# Patient Record
Sex: Female | Born: 1940 | Race: White | Hispanic: No | Smoking: Never smoker
Health system: Southern US, Community
[De-identification: ages and names within clinical notes are randomized; demographics above are authoritative.]

## PROBLEM LIST (undated history)

## (undated) DIAGNOSIS — D259 Leiomyoma of uterus, unspecified: Secondary | ICD-10-CM

## (undated) DIAGNOSIS — E039 Hypothyroidism, unspecified: Secondary | ICD-10-CM

## (undated) DIAGNOSIS — N1330 Unspecified hydronephrosis: Secondary | ICD-10-CM

## (undated) DIAGNOSIS — C50919 Malignant neoplasm of unspecified site of unspecified female breast: Secondary | ICD-10-CM

## (undated) DIAGNOSIS — C801 Malignant (primary) neoplasm, unspecified: Secondary | ICD-10-CM

## (undated) DIAGNOSIS — E785 Hyperlipidemia, unspecified: Secondary | ICD-10-CM

## (undated) HISTORY — DX: Leiomyoma of uterus, unspecified: D25.9

## (undated) HISTORY — PX: MASTECTOMY: SHX3

## (undated) HISTORY — DX: Hyperlipidemia, unspecified: E78.5

## (undated) HISTORY — DX: Malignant neoplasm of unspecified site of unspecified female breast: C50.919

---

## 2009-08-29 ENCOUNTER — Encounter: Admission: RE | Admit: 2009-08-29 | Discharge: 2009-08-29 | Payer: Self-pay | Admitting: Family Medicine

## 2010-11-25 ENCOUNTER — Encounter: Payer: Self-pay | Admitting: Family Medicine

## 2011-04-17 ENCOUNTER — Encounter (HOSPITAL_BASED_OUTPATIENT_CLINIC_OR_DEPARTMENT_OTHER): Payer: Medicare Other | Admitting: Oncology

## 2011-04-17 DIAGNOSIS — C50919 Malignant neoplasm of unspecified site of unspecified female breast: Secondary | ICD-10-CM

## 2011-04-17 DIAGNOSIS — Z800 Family history of malignant neoplasm of digestive organs: Secondary | ICD-10-CM

## 2011-04-17 DIAGNOSIS — N1330 Unspecified hydronephrosis: Secondary | ICD-10-CM

## 2011-04-18 ENCOUNTER — Other Ambulatory Visit: Payer: Self-pay | Admitting: Oncology

## 2011-04-18 DIAGNOSIS — C50919 Malignant neoplasm of unspecified site of unspecified female breast: Secondary | ICD-10-CM

## 2011-04-19 ENCOUNTER — Other Ambulatory Visit: Payer: Self-pay | Admitting: Oncology

## 2011-04-19 DIAGNOSIS — Z853 Personal history of malignant neoplasm of breast: Secondary | ICD-10-CM

## 2011-04-22 ENCOUNTER — Encounter: Payer: Self-pay | Admitting: Oncology

## 2011-04-23 ENCOUNTER — Ambulatory Visit (HOSPITAL_COMMUNITY)
Admission: RE | Admit: 2011-04-23 | Discharge: 2011-04-23 | Disposition: A | Discharge status: Home / Self Care | Payer: Medicare Other | Source: Ambulatory Visit | Attending: Oncology | Admitting: Oncology

## 2011-04-23 ENCOUNTER — Other Ambulatory Visit: Payer: Self-pay | Admitting: Oncology

## 2011-04-23 ENCOUNTER — Ambulatory Visit
Admission: RE | Admit: 2011-04-23 | Discharge: 2011-04-23 | Disposition: A | Discharge status: Home / Self Care | Payer: Medicare Other | Source: Ambulatory Visit | Attending: Oncology | Admitting: Oncology

## 2011-04-23 DIAGNOSIS — Z853 Personal history of malignant neoplasm of breast: Secondary | ICD-10-CM

## 2011-04-23 DIAGNOSIS — Z01818 Encounter for other preprocedural examination: Secondary | ICD-10-CM | POA: Insufficient documentation

## 2011-04-23 DIAGNOSIS — C50919 Malignant neoplasm of unspecified site of unspecified female breast: Secondary | ICD-10-CM | POA: Insufficient documentation

## 2011-04-25 ENCOUNTER — Other Ambulatory Visit (HOSPITAL_COMMUNITY): Payer: Self-pay | Admitting: Urology

## 2011-04-25 DIAGNOSIS — C50919 Malignant neoplasm of unspecified site of unspecified female breast: Secondary | ICD-10-CM

## 2011-04-25 DIAGNOSIS — N1330 Unspecified hydronephrosis: Secondary | ICD-10-CM

## 2011-04-30 ENCOUNTER — Encounter: Payer: Medicare Other | Admitting: Oncology

## 2011-04-30 ENCOUNTER — Other Ambulatory Visit: Payer: Self-pay | Admitting: Oncology

## 2011-04-30 LAB — COMPREHENSIVE METABOLIC PANEL
AST: 20 U/L (ref 0–37)
Alkaline Phosphatase: 49 U/L (ref 39–117)
BUN: 14 mg/dL (ref 6–23)
Creatinine, Ser: 0.79 mg/dL (ref 0.50–1.10)
Potassium: 4.4 mEq/L (ref 3.5–5.3)
Sodium: 139 mEq/L (ref 135–145)
Total Protein: 6.7 g/dL (ref 6.0–8.3)

## 2011-04-30 LAB — CEA: CEA: 1 ng/mL (ref 0.0–5.0)

## 2011-05-01 ENCOUNTER — Encounter (HOSPITAL_COMMUNITY): Payer: Self-pay

## 2011-05-01 ENCOUNTER — Ambulatory Visit (HOSPITAL_COMMUNITY)
Admission: RE | Admit: 2011-05-01 | Discharge: 2011-05-01 | Disposition: A | Discharge status: Home / Self Care | Payer: Medicare Other | Source: Ambulatory Visit | Attending: Oncology | Admitting: Oncology

## 2011-05-01 ENCOUNTER — Encounter (HOSPITAL_COMMUNITY)
Admission: RE | Admit: 2011-05-01 | Discharge: 2011-05-01 | Disposition: A | Discharge status: Home / Self Care | Payer: Medicare Other | Source: Ambulatory Visit | Attending: Urology | Admitting: Urology

## 2011-05-01 ENCOUNTER — Other Ambulatory Visit: Payer: Self-pay | Admitting: Oncology

## 2011-05-01 DIAGNOSIS — C50919 Malignant neoplasm of unspecified site of unspecified female breast: Secondary | ICD-10-CM | POA: Insufficient documentation

## 2011-05-01 DIAGNOSIS — D259 Leiomyoma of uterus, unspecified: Secondary | ICD-10-CM | POA: Insufficient documentation

## 2011-05-01 DIAGNOSIS — Z9010 Acquired absence of unspecified breast and nipple: Secondary | ICD-10-CM | POA: Insufficient documentation

## 2011-05-01 DIAGNOSIS — N1330 Unspecified hydronephrosis: Secondary | ICD-10-CM

## 2011-05-01 HISTORY — DX: Unspecified hydronephrosis: N13.30

## 2011-05-01 HISTORY — DX: Malignant (primary) neoplasm, unspecified: C80.1

## 2011-05-01 MED ORDER — IOHEXOL 300 MG/ML  SOLN
100.0000 mL | Freq: Once | INTRAMUSCULAR | Status: AC | PRN
  Administered 2011-05-01: 100 mL via INTRAVENOUS

## 2011-05-01 MED ORDER — FUROSEMIDE 10 MG/ML IJ SOLN: 40.0000 mg | Freq: Once | INTRAMUSCULAR | Status: DC

## 2011-05-01 MED ORDER — FLUDEOXYGLUCOSE F - 18 (FDG) INJECTION
17.2000 | Freq: Once | INTRAVENOUS | Status: AC | PRN
  Administered 2011-05-01: 17.2 via INTRAVENOUS

## 2011-05-01 MED ORDER — TECHNETIUM TC 99M MERTIATIDE
16.0000 | Freq: Once | INTRAVENOUS | Status: AC | PRN
  Administered 2011-05-01: 16 via INTRAVENOUS

## 2011-05-02 ENCOUNTER — Ambulatory Visit (HOSPITAL_COMMUNITY)
Admission: RE | Admit: 2011-05-02 | Discharge: 2011-05-02 | Disposition: A | Discharge status: Home / Self Care | Payer: Medicare Other | Source: Ambulatory Visit | Attending: Oncology | Admitting: Oncology

## 2011-05-02 ENCOUNTER — Other Ambulatory Visit: Payer: Self-pay | Admitting: Oncology

## 2011-05-02 ENCOUNTER — Ambulatory Visit (HOSPITAL_COMMUNITY): Admission: RE | Admit: 2011-05-02 | Payer: Medicare Other | Source: Ambulatory Visit | Admitting: Oncology

## 2011-05-02 DIAGNOSIS — C50919 Malignant neoplasm of unspecified site of unspecified female breast: Secondary | ICD-10-CM

## 2011-05-02 DIAGNOSIS — Z9010 Acquired absence of unspecified breast and nipple: Secondary | ICD-10-CM | POA: Insufficient documentation

## 2011-05-02 DIAGNOSIS — Z01812 Encounter for preprocedural laboratory examination: Secondary | ICD-10-CM | POA: Insufficient documentation

## 2011-05-02 LAB — CBC
HCT: 39.4 % (ref 36.0–46.0)
Hemoglobin: 13.2 g/dL (ref 12.0–15.0)
MCHC: 33.5 g/dL (ref 30.0–36.0)
MCV: 86 fL (ref 78.0–100.0)
RDW: 13.2 % (ref 11.5–15.5)

## 2011-05-03 ENCOUNTER — Encounter: Payer: Medicare Other | Admitting: Oncology

## 2011-05-10 ENCOUNTER — Encounter (HOSPITAL_BASED_OUTPATIENT_CLINIC_OR_DEPARTMENT_OTHER): Payer: Medicare Other | Admitting: Oncology

## 2011-05-10 ENCOUNTER — Other Ambulatory Visit: Payer: Self-pay | Admitting: Oncology

## 2011-05-10 DIAGNOSIS — C773 Secondary and unspecified malignant neoplasm of axilla and upper limb lymph nodes: Secondary | ICD-10-CM

## 2011-05-10 DIAGNOSIS — Z5111 Encounter for antineoplastic chemotherapy: Secondary | ICD-10-CM

## 2011-05-10 DIAGNOSIS — C50919 Malignant neoplasm of unspecified site of unspecified female breast: Secondary | ICD-10-CM

## 2011-05-10 LAB — CBC WITH DIFFERENTIAL/PLATELET
BASO%: 1.2 % (ref 0.0–2.0)
Basophils Absolute: 0.1 10*3/uL (ref 0.0–0.1)
EOS%: 4 % (ref 0.0–7.0)
HGB: 13.1 g/dL (ref 11.6–15.9)
MCH: 29.8 pg (ref 25.1–34.0)
MCV: 86.6 fL (ref 79.5–101.0)
MONO%: 7.9 % (ref 0.0–14.0)
RBC: 4.38 10*6/uL (ref 3.70–5.45)
RDW: 13.2 % (ref 11.2–14.5)
lymph#: 1.6 10*3/uL (ref 0.9–3.3)

## 2011-05-11 ENCOUNTER — Encounter (HOSPITAL_BASED_OUTPATIENT_CLINIC_OR_DEPARTMENT_OTHER): Payer: Medicare Other | Admitting: Oncology

## 2011-05-11 DIAGNOSIS — Z5111 Encounter for antineoplastic chemotherapy: Secondary | ICD-10-CM

## 2011-05-11 DIAGNOSIS — C773 Secondary and unspecified malignant neoplasm of axilla and upper limb lymph nodes: Secondary | ICD-10-CM

## 2011-05-11 DIAGNOSIS — C50919 Malignant neoplasm of unspecified site of unspecified female breast: Secondary | ICD-10-CM

## 2011-05-17 ENCOUNTER — Other Ambulatory Visit: Payer: Self-pay | Admitting: Oncology

## 2011-05-17 ENCOUNTER — Encounter (HOSPITAL_BASED_OUTPATIENT_CLINIC_OR_DEPARTMENT_OTHER): Payer: Medicare Other | Admitting: Oncology

## 2011-05-17 DIAGNOSIS — C773 Secondary and unspecified malignant neoplasm of axilla and upper limb lymph nodes: Secondary | ICD-10-CM

## 2011-05-17 DIAGNOSIS — C50919 Malignant neoplasm of unspecified site of unspecified female breast: Secondary | ICD-10-CM

## 2011-05-17 DIAGNOSIS — Z170 Estrogen receptor positive status [ER+]: Secondary | ICD-10-CM

## 2011-05-17 DIAGNOSIS — D709 Neutropenia, unspecified: Secondary | ICD-10-CM

## 2011-05-17 LAB — CBC WITH DIFFERENTIAL/PLATELET
Basophils Absolute: 0 10*3/uL (ref 0.0–0.1)
Eosinophils Absolute: 0.2 10*3/uL (ref 0.0–0.5)
HGB: 11.8 g/dL (ref 11.6–15.9)
MONO#: 0 10*3/uL — ABNORMAL LOW (ref 0.1–0.9)
MONO%: 2.2 % (ref 0.0–14.0)
NEUT#: 0.2 10*3/uL — CL (ref 1.5–6.5)
RBC: 3.97 10*6/uL (ref 3.70–5.45)
RDW: 13.1 % (ref 11.2–14.5)
WBC: 1.4 10*3/uL — ABNORMAL LOW (ref 3.9–10.3)
lymph#: 1 10*3/uL (ref 0.9–3.3)

## 2011-05-17 LAB — BASIC METABOLIC PANEL
BUN: 17 mg/dL (ref 6–23)
Chloride: 102 mEq/L (ref 96–112)
Glucose, Bld: 113 mg/dL — ABNORMAL HIGH (ref 70–99)
Potassium: 4.7 mEq/L (ref 3.5–5.3)
Sodium: 139 mEq/L (ref 135–145)

## 2011-05-31 ENCOUNTER — Other Ambulatory Visit: Payer: Self-pay | Admitting: Oncology

## 2011-05-31 ENCOUNTER — Encounter (HOSPITAL_BASED_OUTPATIENT_CLINIC_OR_DEPARTMENT_OTHER): Payer: Medicare Other | Admitting: Oncology

## 2011-05-31 DIAGNOSIS — C50919 Malignant neoplasm of unspecified site of unspecified female breast: Secondary | ICD-10-CM

## 2011-05-31 LAB — CBC WITH DIFFERENTIAL/PLATELET
BASO%: 1.1 % (ref 0.0–2.0)
Eosinophils Absolute: 0 10*3/uL (ref 0.0–0.5)
HCT: 36.3 % (ref 34.8–46.6)
LYMPH%: 25 % (ref 14.0–49.7)
MCHC: 33.9 g/dL (ref 31.5–36.0)
MCV: 84.8 fL (ref 79.5–101.0)
MONO#: 1 10*3/uL — ABNORMAL HIGH (ref 0.1–0.9)
MONO%: 11.3 % (ref 0.0–14.0)
NEUT%: 62.3 % (ref 38.4–76.8)
Platelets: 282 10*3/uL (ref 145–400)
WBC: 9 10*3/uL (ref 3.9–10.3)

## 2011-05-31 LAB — COMPREHENSIVE METABOLIC PANEL
BUN: 15 mg/dL (ref 6–23)
CO2: 22 mEq/L (ref 19–32)
Calcium: 10.3 mg/dL (ref 8.4–10.5)
Chloride: 105 mEq/L (ref 96–112)
Creatinine, Ser: 0.8 mg/dL (ref 0.50–1.10)
Total Bilirubin: 0.3 mg/dL (ref 0.3–1.2)

## 2011-06-01 ENCOUNTER — Encounter (HOSPITAL_BASED_OUTPATIENT_CLINIC_OR_DEPARTMENT_OTHER): Payer: Medicare Other | Admitting: Oncology

## 2011-06-01 DIAGNOSIS — D709 Neutropenia, unspecified: Secondary | ICD-10-CM

## 2011-06-01 DIAGNOSIS — C50919 Malignant neoplasm of unspecified site of unspecified female breast: Secondary | ICD-10-CM

## 2011-06-01 DIAGNOSIS — C773 Secondary and unspecified malignant neoplasm of axilla and upper limb lymph nodes: Secondary | ICD-10-CM

## 2011-06-07 ENCOUNTER — Encounter (HOSPITAL_BASED_OUTPATIENT_CLINIC_OR_DEPARTMENT_OTHER): Payer: Medicare Other | Admitting: Oncology

## 2011-06-07 ENCOUNTER — Other Ambulatory Visit: Payer: Self-pay | Admitting: Physician Assistant

## 2011-06-07 DIAGNOSIS — C773 Secondary and unspecified malignant neoplasm of axilla and upper limb lymph nodes: Secondary | ICD-10-CM

## 2011-06-07 DIAGNOSIS — Z170 Estrogen receptor positive status [ER+]: Secondary | ICD-10-CM

## 2011-06-07 DIAGNOSIS — C50919 Malignant neoplasm of unspecified site of unspecified female breast: Secondary | ICD-10-CM

## 2011-06-07 LAB — CBC WITH DIFFERENTIAL/PLATELET
Basophils Absolute: 0 10*3/uL (ref 0.0–0.1)
Eosinophils Absolute: 0.1 10*3/uL (ref 0.0–0.5)
HCT: 31.5 % — ABNORMAL LOW (ref 34.8–46.6)
HGB: 10.9 g/dL — ABNORMAL LOW (ref 11.6–15.9)
MONO#: 0.1 10*3/uL (ref 0.1–0.9)
NEUT%: 61.3 % (ref 38.4–76.8)
WBC: 2.9 10*3/uL — ABNORMAL LOW (ref 3.9–10.3)
lymph#: 1 10*3/uL (ref 0.9–3.3)

## 2011-06-21 ENCOUNTER — Other Ambulatory Visit: Payer: Self-pay | Admitting: Physician Assistant

## 2011-06-21 ENCOUNTER — Encounter (HOSPITAL_BASED_OUTPATIENT_CLINIC_OR_DEPARTMENT_OTHER): Payer: Medicare Other | Admitting: Oncology

## 2011-06-21 ENCOUNTER — Other Ambulatory Visit: Payer: Self-pay | Admitting: Oncology

## 2011-06-21 DIAGNOSIS — C50919 Malignant neoplasm of unspecified site of unspecified female breast: Secondary | ICD-10-CM

## 2011-06-21 DIAGNOSIS — Z5111 Encounter for antineoplastic chemotherapy: Secondary | ICD-10-CM

## 2011-06-21 DIAGNOSIS — C773 Secondary and unspecified malignant neoplasm of axilla and upper limb lymph nodes: Secondary | ICD-10-CM

## 2011-06-21 DIAGNOSIS — Z170 Estrogen receptor positive status [ER+]: Secondary | ICD-10-CM

## 2011-06-21 LAB — COMPREHENSIVE METABOLIC PANEL
ALT: 13 U/L (ref 0–35)
AST: 25 U/L (ref 0–37)
Albumin: 4.3 g/dL (ref 3.5–5.2)
Alkaline Phosphatase: 82 U/L (ref 39–117)
BUN: 15 mg/dL (ref 6–23)
CO2: 26 mEq/L (ref 19–32)
Calcium: 9.7 mg/dL (ref 8.4–10.5)
Glucose, Bld: 88 mg/dL (ref 70–99)

## 2011-06-21 LAB — CBC WITH DIFFERENTIAL/PLATELET
Basophils Absolute: 0 10*3/uL (ref 0.0–0.1)
Eosinophils Absolute: 0.1 10*3/uL (ref 0.0–0.5)
HCT: 33.2 % — ABNORMAL LOW (ref 34.8–46.6)
LYMPH%: 17.6 % (ref 14.0–49.7)
MCV: 86.6 fL (ref 79.5–101.0)
MONO#: 0.9 10*3/uL (ref 0.1–0.9)
MONO%: 13.2 % (ref 0.0–14.0)
NEUT#: 4.4 10*3/uL (ref 1.5–6.5)
NEUT%: 67 % (ref 38.4–76.8)
Platelets: 238 10*3/uL (ref 145–400)
WBC: 6.6 10*3/uL (ref 3.9–10.3)

## 2011-06-22 ENCOUNTER — Encounter (HOSPITAL_BASED_OUTPATIENT_CLINIC_OR_DEPARTMENT_OTHER): Payer: Medicare Other | Admitting: Oncology

## 2011-06-22 DIAGNOSIS — C773 Secondary and unspecified malignant neoplasm of axilla and upper limb lymph nodes: Secondary | ICD-10-CM

## 2011-06-22 DIAGNOSIS — C50919 Malignant neoplasm of unspecified site of unspecified female breast: Secondary | ICD-10-CM

## 2011-06-28 ENCOUNTER — Encounter (HOSPITAL_BASED_OUTPATIENT_CLINIC_OR_DEPARTMENT_OTHER): Payer: Medicare Other | Admitting: Oncology

## 2011-06-28 ENCOUNTER — Other Ambulatory Visit: Payer: Self-pay | Admitting: Physician Assistant

## 2011-06-28 DIAGNOSIS — C773 Secondary and unspecified malignant neoplasm of axilla and upper limb lymph nodes: Secondary | ICD-10-CM

## 2011-06-28 DIAGNOSIS — C50919 Malignant neoplasm of unspecified site of unspecified female breast: Secondary | ICD-10-CM

## 2011-06-28 LAB — CBC WITH DIFFERENTIAL/PLATELET
BASO%: 1.5 % (ref 0.0–2.0)
EOS%: 1.5 % (ref 0.0–7.0)
MCH: 28.9 pg (ref 25.1–34.0)
MCHC: 33.4 g/dL (ref 31.5–36.0)
MCV: 86.3 fL (ref 79.5–101.0)
MONO%: 1.5 % (ref 0.0–14.0)
NEUT%: 57.9 % (ref 38.4–76.8)
RDW: 15.9 % — ABNORMAL HIGH (ref 11.2–14.5)
lymph#: 1 10*3/uL (ref 0.9–3.3)

## 2011-07-12 ENCOUNTER — Other Ambulatory Visit: Payer: Self-pay | Admitting: Physician Assistant

## 2011-07-12 ENCOUNTER — Encounter (HOSPITAL_BASED_OUTPATIENT_CLINIC_OR_DEPARTMENT_OTHER): Payer: Medicare Other | Admitting: Oncology

## 2011-07-12 DIAGNOSIS — C773 Secondary and unspecified malignant neoplasm of axilla and upper limb lymph nodes: Secondary | ICD-10-CM

## 2011-07-12 DIAGNOSIS — Z5111 Encounter for antineoplastic chemotherapy: Secondary | ICD-10-CM

## 2011-07-12 DIAGNOSIS — Z170 Estrogen receptor positive status [ER+]: Secondary | ICD-10-CM

## 2011-07-12 DIAGNOSIS — C50919 Malignant neoplasm of unspecified site of unspecified female breast: Secondary | ICD-10-CM

## 2011-07-12 LAB — COMPREHENSIVE METABOLIC PANEL
ALT: 31 U/L (ref 0–35)
AST: 30 U/L (ref 0–37)
Albumin: 4.1 g/dL (ref 3.5–5.2)
Alkaline Phosphatase: 79 U/L (ref 39–117)
Glucose, Bld: 87 mg/dL (ref 70–99)
Potassium: 4.5 mEq/L (ref 3.5–5.3)
Sodium: 139 mEq/L (ref 135–145)
Total Bilirubin: 0.3 mg/dL (ref 0.3–1.2)
Total Protein: 6.6 g/dL (ref 6.0–8.3)

## 2011-07-12 LAB — CBC WITH DIFFERENTIAL/PLATELET
BASO%: 1.2 % (ref 0.0–2.0)
Eosinophils Absolute: 0.1 10*3/uL (ref 0.0–0.5)
LYMPH%: 22.2 % (ref 14.0–49.7)
MCHC: 34.9 g/dL (ref 31.5–36.0)
MCV: 88.9 fL (ref 79.5–101.0)
MONO%: 12.2 % (ref 0.0–14.0)
NEUT#: 3.5 10*3/uL (ref 1.5–6.5)
Platelets: 310 10*3/uL (ref 145–400)
RBC: 3.57 10*6/uL — ABNORMAL LOW (ref 3.70–5.45)
RDW: 18.9 % — ABNORMAL HIGH (ref 11.2–14.5)
WBC: 5.5 10*3/uL (ref 3.9–10.3)

## 2011-07-13 ENCOUNTER — Encounter (HOSPITAL_BASED_OUTPATIENT_CLINIC_OR_DEPARTMENT_OTHER): Payer: Medicare Other | Admitting: Oncology

## 2011-07-13 DIAGNOSIS — C50919 Malignant neoplasm of unspecified site of unspecified female breast: Secondary | ICD-10-CM

## 2011-07-13 DIAGNOSIS — Z170 Estrogen receptor positive status [ER+]: Secondary | ICD-10-CM

## 2011-07-13 DIAGNOSIS — C773 Secondary and unspecified malignant neoplasm of axilla and upper limb lymph nodes: Secondary | ICD-10-CM

## 2011-07-19 ENCOUNTER — Other Ambulatory Visit: Payer: Self-pay | Admitting: Physician Assistant

## 2011-07-19 ENCOUNTER — Encounter (HOSPITAL_BASED_OUTPATIENT_CLINIC_OR_DEPARTMENT_OTHER): Payer: Medicare Other | Admitting: Oncology

## 2011-07-19 DIAGNOSIS — C50919 Malignant neoplasm of unspecified site of unspecified female breast: Secondary | ICD-10-CM

## 2011-07-19 LAB — CBC WITH DIFFERENTIAL/PLATELET
EOS%: 1.6 % (ref 0.0–7.0)
Eosinophils Absolute: 0 10*3/uL (ref 0.0–0.5)
LYMPH%: 38.7 % (ref 14.0–49.7)
MCH: 30 pg (ref 25.1–34.0)
MCHC: 33.7 g/dL (ref 31.5–36.0)
MCV: 89 fL (ref 79.5–101.0)
MONO%: 1.6 % (ref 0.0–14.0)
NEUT#: 1.1 10*3/uL — ABNORMAL LOW (ref 1.5–6.5)
Platelets: 77 10*3/uL — ABNORMAL LOW (ref 145–400)
RBC: 3.27 10*6/uL — ABNORMAL LOW (ref 3.70–5.45)
RDW: 18 % — ABNORMAL HIGH (ref 11.2–14.5)
nRBC: 0 % (ref 0–0)

## 2011-08-02 ENCOUNTER — Encounter (HOSPITAL_BASED_OUTPATIENT_CLINIC_OR_DEPARTMENT_OTHER): Payer: Medicare Other | Admitting: Oncology

## 2011-08-02 ENCOUNTER — Other Ambulatory Visit: Payer: Self-pay | Admitting: Physician Assistant

## 2011-08-02 DIAGNOSIS — Z170 Estrogen receptor positive status [ER+]: Secondary | ICD-10-CM

## 2011-08-02 DIAGNOSIS — Z5111 Encounter for antineoplastic chemotherapy: Secondary | ICD-10-CM

## 2011-08-02 DIAGNOSIS — Z800 Family history of malignant neoplasm of digestive organs: Secondary | ICD-10-CM

## 2011-08-02 DIAGNOSIS — C773 Secondary and unspecified malignant neoplasm of axilla and upper limb lymph nodes: Secondary | ICD-10-CM

## 2011-08-02 DIAGNOSIS — C50919 Malignant neoplasm of unspecified site of unspecified female breast: Secondary | ICD-10-CM

## 2011-08-02 DIAGNOSIS — C50319 Malignant neoplasm of lower-inner quadrant of unspecified female breast: Secondary | ICD-10-CM

## 2011-08-02 LAB — CBC WITH DIFFERENTIAL/PLATELET
BASO%: 0.1 % (ref 0.0–2.0)
EOS%: 0 % (ref 0.0–7.0)
HCT: 31 % — ABNORMAL LOW (ref 34.8–46.6)
LYMPH%: 5 % — ABNORMAL LOW (ref 14.0–49.7)
MCH: 30.4 pg (ref 25.1–34.0)
MCHC: 33.5 g/dL (ref 31.5–36.0)
MONO#: 1.1 10*3/uL — ABNORMAL HIGH (ref 0.1–0.9)
NEUT%: 88.7 % — ABNORMAL HIGH (ref 38.4–76.8)
RBC: 3.42 10*6/uL — ABNORMAL LOW (ref 3.70–5.45)
WBC: 17.5 10*3/uL — ABNORMAL HIGH (ref 3.9–10.3)
lymph#: 0.9 10*3/uL (ref 0.9–3.3)

## 2011-08-03 ENCOUNTER — Encounter: Payer: Medicare Other | Admitting: Oncology

## 2011-08-04 LAB — COMPREHENSIVE METABOLIC PANEL
ALT: 16 U/L (ref 0–35)
AST: 19 U/L (ref 0–37)
Chloride: 104 mEq/L (ref 96–112)
Creatinine, Ser: 0.72 mg/dL (ref 0.50–1.10)
Sodium: 138 mEq/L (ref 135–145)
Total Bilirubin: 0.4 mg/dL (ref 0.3–1.2)
Total Protein: 7.2 g/dL (ref 6.0–8.3)

## 2011-08-07 ENCOUNTER — Encounter: Payer: Self-pay | Admitting: Nurse Practitioner

## 2011-08-09 ENCOUNTER — Encounter (HOSPITAL_BASED_OUTPATIENT_CLINIC_OR_DEPARTMENT_OTHER): Payer: Medicare Other | Admitting: Oncology

## 2011-08-09 ENCOUNTER — Other Ambulatory Visit: Payer: Self-pay | Admitting: Physician Assistant

## 2011-08-09 DIAGNOSIS — C773 Secondary and unspecified malignant neoplasm of axilla and upper limb lymph nodes: Secondary | ICD-10-CM

## 2011-08-09 DIAGNOSIS — Z800 Family history of malignant neoplasm of digestive organs: Secondary | ICD-10-CM

## 2011-08-09 DIAGNOSIS — C50919 Malignant neoplasm of unspecified site of unspecified female breast: Secondary | ICD-10-CM

## 2011-08-09 LAB — MANUAL DIFFERENTIAL
Basophil: 1 % (ref 0–2)
EOS: 0 % (ref 0–7)
MONO: 7 % (ref 0–14)
Myelocytes: 4 % — ABNORMAL HIGH (ref 0–0)
Other Cell: 0 % (ref 0–0)
SEG: 41 % (ref 38–77)
nRBC: 0 % (ref 0–0)

## 2011-08-09 LAB — CBC WITH DIFFERENTIAL/PLATELET
MCH: 32.4 pg (ref 25.1–34.0)
MCV: 92.7 fL (ref 79.5–101.0)
Platelets: 148 10*3/uL (ref 145–400)
RBC: 3.36 10*6/uL — ABNORMAL LOW (ref 3.70–5.45)
WBC: 24.6 10*3/uL — ABNORMAL HIGH (ref 3.9–10.3)

## 2011-08-23 ENCOUNTER — Other Ambulatory Visit: Payer: Self-pay | Admitting: Physician Assistant

## 2011-08-23 ENCOUNTER — Encounter (HOSPITAL_BASED_OUTPATIENT_CLINIC_OR_DEPARTMENT_OTHER): Payer: Medicare Other | Admitting: Oncology

## 2011-08-23 DIAGNOSIS — C50919 Malignant neoplasm of unspecified site of unspecified female breast: Secondary | ICD-10-CM

## 2011-08-23 DIAGNOSIS — Z5111 Encounter for antineoplastic chemotherapy: Secondary | ICD-10-CM

## 2011-08-23 DIAGNOSIS — R05 Cough: Secondary | ICD-10-CM

## 2011-08-23 DIAGNOSIS — R059 Cough, unspecified: Secondary | ICD-10-CM

## 2011-08-23 LAB — CBC WITH DIFFERENTIAL/PLATELET
Basophils Absolute: 0 10*3/uL (ref 0.0–0.1)
EOS%: 0 % (ref 0.0–7.0)
HCT: 29.9 % — ABNORMAL LOW (ref 34.8–46.6)
HGB: 10.3 g/dL — ABNORMAL LOW (ref 11.6–15.9)
MCH: 31.8 pg (ref 25.1–34.0)
MCV: 92.7 fL (ref 79.5–101.0)
MONO%: 1.6 % (ref 0.0–14.0)
NEUT%: 93.6 % — ABNORMAL HIGH (ref 38.4–76.8)

## 2011-08-23 LAB — COMPREHENSIVE METABOLIC PANEL
AST: 16 U/L (ref 0–37)
Alkaline Phosphatase: 86 U/L (ref 39–117)
BUN: 20 mg/dL (ref 6–23)
Calcium: 10.1 mg/dL (ref 8.4–10.5)
Creatinine, Ser: 0.77 mg/dL (ref 0.50–1.10)
Total Bilirubin: 0.4 mg/dL (ref 0.3–1.2)

## 2011-08-24 ENCOUNTER — Encounter: Payer: Medicare Other | Admitting: Oncology

## 2011-08-24 DIAGNOSIS — C773 Secondary and unspecified malignant neoplasm of axilla and upper limb lymph nodes: Secondary | ICD-10-CM

## 2011-08-30 ENCOUNTER — Other Ambulatory Visit: Payer: Self-pay | Admitting: Physician Assistant

## 2011-08-30 ENCOUNTER — Encounter (HOSPITAL_BASED_OUTPATIENT_CLINIC_OR_DEPARTMENT_OTHER): Payer: Medicare Other | Admitting: Oncology

## 2011-08-30 DIAGNOSIS — Z170 Estrogen receptor positive status [ER+]: Secondary | ICD-10-CM

## 2011-08-30 DIAGNOSIS — C50919 Malignant neoplasm of unspecified site of unspecified female breast: Secondary | ICD-10-CM

## 2011-08-30 DIAGNOSIS — C773 Secondary and unspecified malignant neoplasm of axilla and upper limb lymph nodes: Secondary | ICD-10-CM

## 2011-08-30 LAB — CBC WITH DIFFERENTIAL/PLATELET
BASO%: 0.8 % (ref 0.0–2.0)
EOS%: 1 % (ref 0.0–7.0)
Eosinophils Absolute: 0.2 10*3/uL (ref 0.0–0.5)
MCHC: 33.3 g/dL (ref 31.5–36.0)
MCV: 93.9 fL (ref 79.5–101.0)
MONO%: 15.4 % — ABNORMAL HIGH (ref 0.0–14.0)
NEUT#: 16.6 10*3/uL — ABNORMAL HIGH (ref 1.5–6.5)
RBC: 3.26 10*6/uL — ABNORMAL LOW (ref 3.70–5.45)
RDW: 16.4 % — ABNORMAL HIGH (ref 11.2–14.5)
nRBC: 0 % (ref 0–0)

## 2011-09-12 ENCOUNTER — Other Ambulatory Visit: Payer: Self-pay | Admitting: Oncology

## 2011-09-12 DIAGNOSIS — C50919 Malignant neoplasm of unspecified site of unspecified female breast: Secondary | ICD-10-CM | POA: Insufficient documentation

## 2011-09-13 ENCOUNTER — Other Ambulatory Visit: Payer: Self-pay | Admitting: Physician Assistant

## 2011-09-13 ENCOUNTER — Other Ambulatory Visit (HOSPITAL_BASED_OUTPATIENT_CLINIC_OR_DEPARTMENT_OTHER): Payer: Medicare Other | Admitting: Lab

## 2011-09-13 ENCOUNTER — Other Ambulatory Visit: Payer: Self-pay

## 2011-09-13 ENCOUNTER — Ambulatory Visit (HOSPITAL_BASED_OUTPATIENT_CLINIC_OR_DEPARTMENT_OTHER): Payer: Medicare Other | Admitting: Physician Assistant

## 2011-09-13 ENCOUNTER — Ambulatory Visit (HOSPITAL_BASED_OUTPATIENT_CLINIC_OR_DEPARTMENT_OTHER): Payer: Medicare Other

## 2011-09-13 VITALS — BP 143/89 | HR 114 | Temp 98.1°F | Ht 64.5 in | Wt 191.7 lb

## 2011-09-13 DIAGNOSIS — C50919 Malignant neoplasm of unspecified site of unspecified female breast: Secondary | ICD-10-CM

## 2011-09-13 DIAGNOSIS — C773 Secondary and unspecified malignant neoplasm of axilla and upper limb lymph nodes: Secondary | ICD-10-CM

## 2011-09-13 DIAGNOSIS — Z5111 Encounter for antineoplastic chemotherapy: Secondary | ICD-10-CM

## 2011-09-13 LAB — COMPREHENSIVE METABOLIC PANEL
ALT: 14 U/L (ref 0–35)
CO2: 22 mEq/L (ref 19–32)
Creatinine, Ser: 0.76 mg/dL (ref 0.50–1.10)
Glucose, Bld: 107 mg/dL — ABNORMAL HIGH (ref 70–99)
Total Bilirubin: 0.3 mg/dL (ref 0.3–1.2)

## 2011-09-13 LAB — CBC WITH DIFFERENTIAL/PLATELET
BASO%: 0.1 % (ref 0.0–2.0)
EOS%: 0 % (ref 0.0–7.0)
MCHC: 33.2 g/dL (ref 31.5–36.0)
MONO#: 0.6 10*3/uL (ref 0.1–0.9)
RBC: 3.26 10*6/uL — ABNORMAL LOW (ref 3.70–5.45)
RDW: 14.8 % — ABNORMAL HIGH (ref 11.2–14.5)
WBC: 9.9 10*3/uL (ref 3.9–10.3)
lymph#: 0.7 10*3/uL — ABNORMAL LOW (ref 0.9–3.3)

## 2011-09-13 MED ORDER — SODIUM CHLORIDE 0.9 % IJ SOLN
10.0000 mL | INTRAMUSCULAR | Status: DC | PRN
  Administered 2011-09-13: 10 mL
  Filled 2011-09-13: qty 10

## 2011-09-13 MED ORDER — SODIUM CHLORIDE 0.9 % IV SOLN
Freq: Once | INTRAVENOUS | Status: AC
  Administered 2011-09-13: 11:00:00 via INTRAVENOUS

## 2011-09-13 MED ORDER — DEXAMETHASONE SODIUM PHOSPHATE 10 MG/ML IJ SOLN
10.0000 mg | Freq: Once | INTRAMUSCULAR | Status: AC
  Administered 2011-09-13: 10 mg via INTRAVENOUS

## 2011-09-13 MED ORDER — LORAZEPAM 2 MG/ML IJ SOLN
0.5000 mg | Freq: Once | INTRAMUSCULAR | Status: AC
  Administered 2011-09-13: 0.5 mg via INTRAVENOUS

## 2011-09-13 MED ORDER — DOCETAXEL CHEMO INJECTION 160 MG/16ML
75.0000 mg/m2 | Freq: Once | INTRAVENOUS | Status: AC
  Administered 2011-09-13: 150 mg via INTRAVENOUS
  Filled 2011-09-13: qty 15

## 2011-09-13 MED ORDER — HEPARIN SOD (PORK) LOCK FLUSH 100 UNIT/ML IV SOLN
500.0000 [IU] | Freq: Once | INTRAVENOUS | Status: AC | PRN
  Administered 2011-09-13: 500 [IU]
  Filled 2011-09-13: qty 5

## 2011-09-13 MED ORDER — ONDANSETRON 8 MG/50ML IVPB (CHCC)
8.0000 mg | Freq: Once | INTRAVENOUS | Status: AC
  Administered 2011-09-13: 8 mg via INTRAVENOUS

## 2011-09-13 NOTE — Progress Notes (Signed)
Progress note dictated-CTS 

## 2011-09-13 NOTE — Progress Notes (Deleted)
CC:   Lloyd J. Peterson, M.D. Denny Rodenberg, MD Jagadeesh R. 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  She has completed 4 cycles of q.3 week FEC and is now due for day 1, cycle 3 of 4 planned q.3 week doses of adjuvant Taxotere with Neulasta support on day 2.  SUBJECTIVE:  [name] is seen today with her husband in accompaniment in anticipation of her 3rd of 4 planned cycles of q.3 week Taxotere given in the adjuvant setting.  She has premedicated with dexamethasone per protocol.  Overall she is feeling well.  She denies any unexplained fevers, chills, night sweats, shortness of breath, chest pain, nausea, emesis, diarrhea, or constipation problems.  She denies any hand-foot changes.  She denies any excessive eye tearing.  Her overall energy level is actually quite good.  Review of systems is negative.  ALLERGIES:  Shrimp allergy.  CURRENT MEDICATIONS:  As per EMR.  ECOG STATUS:  0.  OBJECTIVE/PHYSICAL EXAMINATION:  Vital signs:  Blood pressure is 143/89, pulse 114, respirations 20, temp 98.1, weight 191 pounds. HEENT: Conjunctivae pink.  Sclerae anicteric.  Oropharynx is benign without oral mucositis or candidosis.  Lungs:  Clear to auscultation without wheezing or rhonchi.  Heart:  Regular rate and rhythm without murmurs, rubs, gallops, or clicks.  Abdomen:  Soft, normal bowel sounds. Extremities:  Free of pedal edema.  Neurologic:  Nonfocal.  LABORATORY DATA:  Hemoglobin 10.1 g, platelet count 342,000, WBC 9900, with an ANC of 8600.  Serum chemistry panel pending.  IMPRESSION:  A 70-year-old [place], [place] woman with a history of a T4 N2 ER PR positive HER-2 negative inflammatory carcinoma of the  right breast for which she underwent a right modified radical mastectomy with axillary node dissection revealing extensive tumor involvement in 9/9 nodes with associated extracapsular extension.  She completed 4 cycles of q.3 week FEC with Neulasta support on day 2 due for day 1, cycle 3 of 4 planned q.3 week doses of adjuvant Taxotere today.  Case has been reviewed with Dr. Rubin.  PLAN:  Ms. Vallin will receive treatment today as scheduled with Ativan being added to her premeds.  She will return on day 2 for Neulasta providing granulocyte support.  I will see her in 1 week's time for nadir assessment.  She understands and agrees with this plan.    ______________________________ Christine Scherer, PA CS/MEDQ  D:  09/13/2011  T:  09/13/2011  Job:  [id number] 

## 2011-09-13 NOTE — Progress Notes (Deleted)
CC:   Brandi Bowen, M.D. Brandi Rodenberg, MD Brandi R. 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  She has completed 4 cycles of q.3 week FEC and is now due for day 1, cycle 3 of 4 planned q.3 week doses of adjuvant Taxotere with Neulasta support on day 2.  SUBJECTIVE:  Brandi Bowen is seen today with her husband in accompaniment in anticipation of her 3rd of 4 planned cycles of q.3 week Taxotere given in the adjuvant setting.  She has premedicated with dexamethasone per protocol.  Overall she is feeling well.  She denies any unexplained fevers, chills, night sweats, shortness of breath, chest pain, nausea, emesis, diarrhea, or constipation problems.  She denies any hand-foot changes.  She denies any excessive eye tearing.  Her overall energy level is actually quite good.  Review of systems is negative.  ALLERGIES:  Shrimp allergy.  CURRENT MEDICATIONS:  As per EMR.  ECOG STATUS:  0.  OBJECTIVE/PHYSICAL EXAMINATION:  Vital signs:  Blood pressure is 143/89, pulse 114, respirations 20, temp 98.1, weight 191 pounds. HEENT: Conjunctivae pink.  Sclerae anicteric.  Oropharynx is benign without oral mucositis or candidosis.  Lungs:  Clear to auscultation without wheezing or rhonchi.  Heart:  Regular rate and rhythm without murmurs, rubs, gallops, or clicks.  Abdomen:  Soft, normal bowel sounds. Extremities:  Free of pedal edema.  Neurologic:  Nonfocal.  LABORATORY DATA:  Hemoglobin 10.1 g, platelet count 342,000, WBC 9900, with an ANC of 8600.  Serum chemistry panel pending.  IMPRESSION:  A 70-year-old [place], [place] woman with a history of a T4 N2 ER PR positive HER-2 negative inflammatory carcinoma of the  right breast for which she underwent a right modified radical mastectomy with axillary node dissection revealing extensive tumor involvement in 9/9 nodes with associated extracapsular extension.  She completed 4 cycles of q.3 week FEC with Neulasta support on day 2 due for day 1, cycle 3 of 4 planned q.3 week doses of adjuvant Taxotere today.  Case has been reviewed with Dr. Rubin.  PLAN:  [name] will receive treatment today as scheduled with Ativan being added to her premeds.  She will return on day 2 for Neulasta providing granulocyte support.  I will see her in 1 week's time for nadir assessment.  She understands and agrees with this plan.    ______________________________ Beauregard Jarrells, PA CS/MEDQ  D:  09/13/2011  T:  09/13/2011  Job:  [id number] 

## 2011-09-14 ENCOUNTER — Ambulatory Visit (HOSPITAL_BASED_OUTPATIENT_CLINIC_OR_DEPARTMENT_OTHER): Payer: Medicare Other

## 2011-09-14 ENCOUNTER — Other Ambulatory Visit: Payer: Self-pay | Admitting: Oncology

## 2011-09-14 VITALS — BP 125/79 | HR 81 | Temp 98.4°F

## 2011-09-14 DIAGNOSIS — Z170 Estrogen receptor positive status [ER+]: Secondary | ICD-10-CM

## 2011-09-14 DIAGNOSIS — C50919 Malignant neoplasm of unspecified site of unspecified female breast: Secondary | ICD-10-CM

## 2011-09-14 MED ORDER — PEGFILGRASTIM INJECTION 6 MG/0.6ML
6.0000 mg | Freq: Once | SUBCUTANEOUS | Status: AC
  Administered 2011-09-14: 6 mg via SUBCUTANEOUS

## 2011-09-16 NOTE — Progress Notes (Signed)
CC:   Brandi Bowen. Brandi Bowen, M.D. 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  She has completed 4 cycles of q.3 week FEC and is now due for day 1, cycle 3 of 4 planned q.3 week doses of adjuvant Taxotere with Neulasta support on day 2.  SUBJECTIVE:  Brandi Bowen is seen today with her husband in accompaniment in anticipation of her 3rd of 4 planned cycles of q.3 week Taxotere given in the adjuvant setting.  She has premedicated with dexamethasone per protocol.  Overall she is feeling well.  She denies any unexplained fevers, chills, night sweats, shortness of breath, chest pain, nausea, emesis, diarrhea, or constipation problems.  She denies any hand-foot changes.  She denies any excessive eye tearing.  Her overall energy level is actually quite good.  Review of systems is negative.  ALLERGIES:  Shrimp allergy.  CURRENT MEDICATIONS:  As per EMR.  ECOG STATUS:  0.  OBJECTIVE/PHYSICAL EXAMINATION:  Vital signs:  Blood pressure is 143/89, pulse 114, respirations 20, temp 98.1, weight 191 pounds. HEENT: Conjunctivae pink.  Sclerae anicteric.  Oropharynx is benign without oral mucositis or candidosis.  Lungs:  Clear to auscultation without wheezing or rhonchi.  Heart:  Regular rate and rhythm without murmurs, rubs, gallops, or clicks.  Abdomen:  Soft, normal bowel sounds. Extremities:  Free of pedal edema.  Neurologic:  Nonfocal.  LABORATORY DATA:  Hemoglobin 10.1 g, platelet count 342,000, WBC 9900, with an ANC of 8600.  Serum chemistry panel pending.  IMPRESSION:  A 70 year old [place], [place] woman with a history of a T4 N2 ER PR positive HER-2 negative inflammatory carcinoma of the  right breast for which she underwent a right modified radical mastectomy with axillary node dissection revealing extensive tumor involvement in 9/9 nodes with associated extracapsular extension.  She completed 4 cycles of q.3 week FEC with Neulasta support on day 2 due for day 1, cycle 3 of 4 planned q.3 week doses of adjuvant Taxotere today.  Case has been reviewed with Dr. Donnie Bowen.  PLAN:  Brandi Bowen will receive treatment today as scheduled with Ativan being added to her premeds.  She will return on day 2 for Neulasta providing granulocyte support.  I will see her in 1 week's time for nadir assessment.  She understands and agrees with this plan.    ______________________________ Brandi Nimrod, PA CS/MEDQ  D:  09/13/2011  T:  09/13/2011  Job:  [id number]

## 2011-09-20 ENCOUNTER — Other Ambulatory Visit: Payer: Self-pay

## 2011-09-20 ENCOUNTER — Ambulatory Visit (HOSPITAL_BASED_OUTPATIENT_CLINIC_OR_DEPARTMENT_OTHER): Payer: Medicare Other | Admitting: Physician Assistant

## 2011-09-20 ENCOUNTER — Other Ambulatory Visit: Payer: Medicare Other | Admitting: Lab

## 2011-09-20 VITALS — BP 129/84 | HR 103 | Temp 98.6°F | Ht 64.5 in | Wt 190.4 lb

## 2011-09-20 DIAGNOSIS — C773 Secondary and unspecified malignant neoplasm of axilla and upper limb lymph nodes: Secondary | ICD-10-CM

## 2011-09-20 DIAGNOSIS — Z170 Estrogen receptor positive status [ER+]: Secondary | ICD-10-CM

## 2011-09-20 DIAGNOSIS — C50919 Malignant neoplasm of unspecified site of unspecified female breast: Secondary | ICD-10-CM

## 2011-09-20 LAB — CBC WITH DIFFERENTIAL/PLATELET
Eosinophils Absolute: 0.2 10*3/uL (ref 0.0–0.5)
HCT: 32.1 % — ABNORMAL LOW (ref 34.8–46.6)
HGB: 10.5 g/dL — ABNORMAL LOW (ref 11.6–15.9)
LYMPH%: 8.9 % — ABNORMAL LOW (ref 14.0–49.7)
MONO#: 4.6 10*3/uL — ABNORMAL HIGH (ref 0.1–0.9)
NEUT#: 19.6 10*3/uL — ABNORMAL HIGH (ref 1.5–6.5)
NEUT%: 72.6 % (ref 38.4–76.8)
Platelets: 317 10*3/uL (ref 145–400)
WBC: 27 10*3/uL — ABNORMAL HIGH (ref 3.9–10.3)

## 2011-09-20 NOTE — Progress Notes (Signed)
Progress note dictated-CTS 

## 2011-09-20 NOTE — Progress Notes (Signed)
CC:   Brandi Lerner, MD Brandi Bowen, M.D. [name], MD  PROBLEM:  A 70 year old Brandi Bowen, Brandi Bowen, woman with a history of a T4 N2, reported ER-PR positive, HER2 negative, inflammatory carcinoma of the right breast S/P right modified radical mastectomy with axillary node dissection, revealing 9/9 positive nodes with extracapsular extension.  She has completed 4 cycles of q.3 week FEC and is currently day 7 cycle 3 of 4 planned q.3 week doses of adjuvant Taxotere with Neulasta support on day 2.  SUBJECTIVE:  Brandi Bowen presents today with her husband in accompaniment for followup after her third of 4 planned cycles of q.3 week Taxotere. She feels well.  She really denies any fevers, chills, night sweats, shortness of breath, chest pain.  No nausea, emesis, diarrhea or constipation issues.  No hand-foot changes.  No excessive eye tearing. She has a little bit of mouth sensitivity but denies frank mouth sores. Energy level is pretty good.  She continues to work full time.  REVIEW OF SYSTEMS:  Negative.  ALLERGIES:  Shrimp.  CURRENT MEDICATIONS:  Reviewed with the patient, as per EMR.  ECOG STATUS:  One.  PHYSICAL EXAM:  Vital signs:  Stable.  HEENT:  Conjunctivae pink. Sclerae anicteric.  Oropharynx shows some mild mucosal change but no overt mucositis or candidosis.  Lungs:  Are clear to auscultation.  No wheezing or rhonchi.  Heart:  Regular rate and rhythm without murmurs, rubs, gallops or clicks.  Abdomen:  Soft, nontender without organomegaly.  Normal bowel sounds are present.  Extremities:  Free of pedal edema.  No PPE changes.  No nail bed changes.  Neurologic: Nonfocal.  The patient is alert and oriented times 3.  LABORATORY DATA:  Hemoglobin 10.5 g, platelet count 317,000, WBC 27,000 with an ANC of 19,600.  Chemistry panel from 1 week ago was within normal limits.  Glucose minimally elevated at 107.  IMPRESSION:  A 70 year old [place],  Brandi Bowen, woman a history of a T4 N2 ER-PR positive, HER2 negative, inflammatory carcinoma of the right breast, for which she underwent a right modified radical mastectomy with axillary node dissection revealing extensive tumor involvement in 9 of 9 nodes with associated extracapsular extension. The patient has completed 4 cycles of q.3 week FEC with Neulasta support on day 2, due currently day 7 cycle 3 of 4 planned q.3 week doses of adjuvant Taxotere.  Case has been reviewed with Dr. Donnie Coffin.  PLAN:  Brandi Bowen is doing well.  We will see her back in 2 weeks' time for followup prior to day 1 cycle 4 Taxotere.  She knows to contact us in the interim if the need should arise.    ______________________________ Sharyl Nimrod, PA CS/MEDQ  D:  09/20/2011  T:  09/20/2011  Job:  [id number]

## 2011-10-04 ENCOUNTER — Other Ambulatory Visit: Payer: Medicare Other

## 2011-10-04 ENCOUNTER — Ambulatory Visit (HOSPITAL_BASED_OUTPATIENT_CLINIC_OR_DEPARTMENT_OTHER): Payer: Medicare Other | Admitting: Physician Assistant

## 2011-10-04 ENCOUNTER — Other Ambulatory Visit: Payer: Self-pay

## 2011-10-04 ENCOUNTER — Telehealth: Payer: Self-pay | Admitting: Oncology

## 2011-10-04 ENCOUNTER — Ambulatory Visit: Payer: Medicare Other | Admitting: Physician Assistant

## 2011-10-04 ENCOUNTER — Ambulatory Visit (HOSPITAL_BASED_OUTPATIENT_CLINIC_OR_DEPARTMENT_OTHER): Payer: Medicare Other

## 2011-10-04 ENCOUNTER — Other Ambulatory Visit: Payer: Self-pay | Admitting: Oncology

## 2011-10-04 VITALS — BP 155/89 | HR 98 | Temp 98.2°F | Ht 64.5 in | Wt 196.2 lb

## 2011-10-04 DIAGNOSIS — Z170 Estrogen receptor positive status [ER+]: Secondary | ICD-10-CM

## 2011-10-04 DIAGNOSIS — C773 Secondary and unspecified malignant neoplasm of axilla and upper limb lymph nodes: Secondary | ICD-10-CM

## 2011-10-04 DIAGNOSIS — C50919 Malignant neoplasm of unspecified site of unspecified female breast: Secondary | ICD-10-CM

## 2011-10-04 DIAGNOSIS — Z5111 Encounter for antineoplastic chemotherapy: Secondary | ICD-10-CM

## 2011-10-04 LAB — COMPREHENSIVE METABOLIC PANEL
ALT: 13 U/L (ref 0–35)
AST: 16 U/L (ref 0–37)
Albumin: 4.2 g/dL (ref 3.5–5.2)
Calcium: 9.9 mg/dL (ref 8.4–10.5)
Chloride: 104 mEq/L (ref 96–112)
Potassium: 4.3 mEq/L (ref 3.5–5.3)
Sodium: 138 mEq/L (ref 135–145)
Total Protein: 6.6 g/dL (ref 6.0–8.3)

## 2011-10-04 LAB — CBC WITH DIFFERENTIAL/PLATELET
Basophils Absolute: 0 10*3/uL (ref 0.0–0.1)
EOS%: 0 % (ref 0.0–7.0)
Eosinophils Absolute: 0 10*3/uL (ref 0.0–0.5)
HCT: 32 % — ABNORMAL LOW (ref 34.8–46.6)
HGB: 10.6 g/dL — ABNORMAL LOW (ref 11.6–15.9)
MCH: 30.4 pg (ref 25.1–34.0)
MCV: 91.7 fL (ref 79.5–101.0)
MONO%: 3.8 % (ref 0.0–14.0)
NEUT%: 88.5 % — ABNORMAL HIGH (ref 38.4–76.8)
lymph#: 0.8 10*3/uL — ABNORMAL LOW (ref 0.9–3.3)

## 2011-10-04 MED ORDER — HEPARIN SOD (PORK) LOCK FLUSH 100 UNIT/ML IV SOLN
500.0000 [IU] | Freq: Once | INTRAVENOUS | Status: AC | PRN
  Administered 2011-10-04: 500 [IU]
  Filled 2011-10-04: qty 5

## 2011-10-04 MED ORDER — SODIUM CHLORIDE 0.9 % IJ SOLN
10.0000 mL | INTRAMUSCULAR | Status: DC | PRN
  Administered 2011-10-04: 10 mL
  Filled 2011-10-04: qty 10

## 2011-10-04 MED ORDER — DOCETAXEL CHEMO INJECTION 160 MG/16ML
75.0000 mg/m2 | Freq: Once | INTRAVENOUS | Status: AC
  Administered 2011-10-04: 150 mg via INTRAVENOUS
  Filled 2011-10-04: qty 15

## 2011-10-04 MED ORDER — ONDANSETRON 8 MG/50ML IVPB (CHCC)
8.0000 mg | Freq: Once | INTRAVENOUS | Status: AC
  Administered 2011-10-04: 8 mg via INTRAVENOUS

## 2011-10-04 MED ORDER — LORAZEPAM 2 MG/ML IJ SOLN
0.5000 mg | Freq: Once | INTRAMUSCULAR | Status: AC
  Administered 2011-10-04: 0.5 mg via INTRAVENOUS

## 2011-10-04 MED ORDER — DEXAMETHASONE SODIUM PHOSPHATE 10 MG/ML IJ SOLN
10.0000 mg | Freq: Once | INTRAMUSCULAR | Status: AC
  Administered 2011-10-04: 10 mg via INTRAVENOUS

## 2011-10-04 MED ORDER — SODIUM CHLORIDE 0.9 % IV SOLN
Freq: Once | INTRAVENOUS | Status: AC
  Administered 2011-10-04: 10:00:00 via INTRAVENOUS

## 2011-10-04 NOTE — Progress Notes (Signed)
Progress note dictated-CTS 

## 2011-10-04 NOTE — Progress Notes (Signed)
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  She has completed 4 cycles of q.3 week FEC and due for day 1, cycle 4 of 4 planned q.3 week doses of adjuvant Taxotere with Neulasta support on day 2.  SUBJECTIVE:  Brandi Bowen present with her husband today for followup prior to her 4th and final cycle of q.3 week Taxotere.  She is feeling well, denying any unexplained fevers, chills, night sweats, shortness of breath, chest pain, nausea, emesis, diarrhea, or constipation issues. Energy level is actually quite good.  She has taken her dexamethasone. She denies any neuropathy symptoms.  No hand-foot changes.  No excessive eye tearing.  REVIEW OF SYSTEMS:  Negative.  ALLERGIES:  SHRIMP.  CURRENT MEDICATIONS:  As per EMR.  PERFORMANCE STATUS:  ECOG status of 1.  PHYSICAL EXAMINATION:  Vital Signs:  Blood pressure is 155/89.  Pulse 98.  Respirations 20.  Temp 98.2.  Weight 196 pounds.  HEENT: Conjunctivae are pink.  Sclerae are anicteric.  Oropharynx is benign. Lungs:  Clear to auscultation.  No wheezing or rhonchi.  Heart:  Regular rate and rhythm without murmurs, rubs, gallops, or clicks.  Abdomen: Soft, nontender.  Normal bowel sounds.  Extremities:  Free of pedal edema or PPE changes.  Neurologic exam:  Nonfocal.  The patient is alert and oriented x3.  LABORATORY DATA:  Hemoglobin 10.6 g, platelet count 367,000, WBC 10,200 with an ANC of 9,000.  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 She has completed 4 cycles of every 3 week FEC and is due for day 1, cycle 4 of 4 planned every 3 week doses of adjuvant Taxotere.  The case was reviewed with Dr. Donnie Coffin.  PLAN:  The patient will receive treatment today as scheduled.  She will return on day 2 for Neulasta, providing granulocyte support.  I will see her in 1 week's time for nadir assessment and to discuss radiation oncology referral.  The patient understands and agrees with this plan.    ______________________________ Brandi Nimrod, PA CS/MEDQ  D:  10/04/2011  T:  10/04/2011  Job:  [id number]

## 2011-10-04 NOTE — Telephone Encounter (Signed)
pt came in to added flush appt for jan2013.  gv pt scheduled for dec-jan2013

## 2011-10-04 NOTE — Patient Instructions (Signed)
1200-Pt discharged ambulatory with next appointment confirmed.  Pt aware to call with any questions or concerns.

## 2011-10-05 ENCOUNTER — Ambulatory Visit (HOSPITAL_BASED_OUTPATIENT_CLINIC_OR_DEPARTMENT_OTHER): Payer: Medicare Other

## 2011-10-05 VITALS — BP 142/81 | HR 60 | Temp 97.4°F

## 2011-10-05 DIAGNOSIS — C773 Secondary and unspecified malignant neoplasm of axilla and upper limb lymph nodes: Secondary | ICD-10-CM

## 2011-10-05 DIAGNOSIS — C50919 Malignant neoplasm of unspecified site of unspecified female breast: Secondary | ICD-10-CM

## 2011-10-05 MED ORDER — PEGFILGRASTIM INJECTION 6 MG/0.6ML
6.0000 mg | Freq: Once | SUBCUTANEOUS | Status: AC
  Administered 2011-10-05: 6 mg via SUBCUTANEOUS

## 2011-10-11 ENCOUNTER — Ambulatory Visit (HOSPITAL_BASED_OUTPATIENT_CLINIC_OR_DEPARTMENT_OTHER): Payer: Medicare Other | Admitting: Physician Assistant

## 2011-10-11 ENCOUNTER — Encounter: Payer: Self-pay | Admitting: Physician Assistant

## 2011-10-11 ENCOUNTER — Other Ambulatory Visit: Payer: Self-pay

## 2011-10-11 ENCOUNTER — Other Ambulatory Visit (HOSPITAL_BASED_OUTPATIENT_CLINIC_OR_DEPARTMENT_OTHER): Payer: Medicare Other | Admitting: Lab

## 2011-10-11 ENCOUNTER — Telehealth: Payer: Self-pay | Admitting: *Deleted

## 2011-10-11 ENCOUNTER — Other Ambulatory Visit: Payer: Self-pay | Admitting: Physician Assistant

## 2011-10-11 VITALS — BP 142/87 | HR 108 | Temp 98.4°F | Ht 64.5 in | Wt 192.2 lb

## 2011-10-11 DIAGNOSIS — C50919 Malignant neoplasm of unspecified site of unspecified female breast: Secondary | ICD-10-CM

## 2011-10-11 DIAGNOSIS — C773 Secondary and unspecified malignant neoplasm of axilla and upper limb lymph nodes: Secondary | ICD-10-CM

## 2011-10-11 DIAGNOSIS — Z170 Estrogen receptor positive status [ER+]: Secondary | ICD-10-CM

## 2011-10-11 LAB — CBC WITH DIFFERENTIAL/PLATELET
BASO%: 0.9 % (ref 0.0–2.0)
EOS%: 0.6 % (ref 0.0–7.0)
HCT: 34.1 % — ABNORMAL LOW (ref 34.8–46.6)
MCH: 30.4 pg (ref 25.1–34.0)
MCHC: 32.8 g/dL (ref 31.5–36.0)
NEUT%: 72.6 % (ref 38.4–76.8)
RDW: 14.9 % — ABNORMAL HIGH (ref 11.2–14.5)
lymph#: 2.7 10*3/uL (ref 0.9–3.3)

## 2011-10-11 NOTE — Progress Notes (Signed)
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 She has completed 4 cycles of every three-week FEC and is currently day 7 cycle 4 of 4 planned every 3 week doses of adjuvant Taxotere. She has received Neulasta support on day 2.  Mrs. Wurzel presents today with her husband for followup after her fourth and final cycle of every 3 week Taxotere. She is well, denying any unexplained fevers, chills, night sweats, shortness of breath, chest pain, nausea, emesis, diarrhea or constipation issues. She has no neuropathy symptoms. No evidence of hand-foot changes or excessive eye tearing. Her review of systems is negative.  Allergies: Shrimp  Current medications: Reviewed with patient, as per EMR.  ECoG status of 1.  Physical exam: Vital signs are stable, HEENT conjunctivae pink sclera anicteric oropharynx is benign without oral mucositis or candidiasis. Lungs are clear to auscultation without wheezing or rhonchi. No vertebral tenderness with gentle percussion. Cardiac: Regular rate and rhythm without murmurs rubs gallops or clicks. Abdomen: Soft, nontender, without organomegaly, and normal bowel sounds are present. Extremities: Free of pedal edema. Neurologic exam is nonfocal patient is alert and oriented x3.  Laboratory data: Hemoglobin 11.2, platelet count 342,000, WBC 21,000 with an ANC of 15,200. Chemistry panel from one week ago is within normal limits.  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 She has completed 4 cycles of every three-week FEC, currently day 7 cycle 4 of 4 planned every 3 week doses of adjuvant Taxotere.  Case reviewed with Dr. Donnie Coffin.  Plan: Patient will be referred to Dr. Lurline Hare, in radiation oncology for definitive consultation. We will officially see Mrs. Uplinger back in early March of 2013. If it is felt prudent that patient should receive Xeloda concomitantly with her radiation therapy, we will follow her closely with followup exams and appropriate labs on an every other week basis. Patient and her husband understand and agree with the above plan.  Sharyl Nimrod PA 10/11/11

## 2011-10-11 NOTE — Telephone Encounter (Signed)
gave patient appointment 01-09-2012 starting at 2:45pm printed out calendar and gave to the patient

## 2011-11-01 ENCOUNTER — Encounter: Payer: Self-pay | Admitting: Radiation Oncology

## 2011-11-06 ENCOUNTER — Ambulatory Visit
Admission: RE | Admit: 2011-11-06 | Discharge: 2011-11-06 | Disposition: A | Discharge status: Home / Self Care | Payer: Medicare Other | Source: Ambulatory Visit | Attending: Radiation Oncology | Admitting: Radiation Oncology

## 2011-11-06 ENCOUNTER — Encounter: Payer: Self-pay | Admitting: Radiation Oncology

## 2011-11-06 DIAGNOSIS — Z9221 Personal history of antineoplastic chemotherapy: Secondary | ICD-10-CM | POA: Insufficient documentation

## 2011-11-06 DIAGNOSIS — Z9010 Acquired absence of unspecified breast and nipple: Secondary | ICD-10-CM | POA: Insufficient documentation

## 2011-11-06 DIAGNOSIS — E039 Hypothyroidism, unspecified: Secondary | ICD-10-CM | POA: Insufficient documentation

## 2011-11-06 DIAGNOSIS — Z510 Encounter for antineoplastic radiation therapy: Secondary | ICD-10-CM | POA: Insufficient documentation

## 2011-11-06 DIAGNOSIS — C50919 Malignant neoplasm of unspecified site of unspecified female breast: Secondary | ICD-10-CM | POA: Insufficient documentation

## 2011-11-06 DIAGNOSIS — Z800 Family history of malignant neoplasm of digestive organs: Secondary | ICD-10-CM | POA: Insufficient documentation

## 2011-11-06 DIAGNOSIS — Z170 Estrogen receptor positive status [ER+]: Secondary | ICD-10-CM | POA: Insufficient documentation

## 2011-11-06 DIAGNOSIS — C773 Secondary and unspecified malignant neoplasm of axilla and upper limb lymph nodes: Secondary | ICD-10-CM | POA: Insufficient documentation

## 2011-11-06 HISTORY — DX: Hypothyroidism, unspecified: E03.9

## 2011-11-06 NOTE — Progress Notes (Signed)
CC:   Brandi Bowen, M.D., F.R.C.P.C. [name], MD Vallathucherry Allison Quarry, MD  PREVIOUS INTERVENTIONS:  Right mastectomy and axillary lymph node dissection on 04/23/2011 revealing 2 foci of invasive ductal carcinoma measuring 5.4 x 1.6 cm with a close posterior margin at 0.1 cm with diffuse lymphovascular invasion, including dermal lymphatics, and carcinoma involving the skin with perineural invasion.  8/8 lymph nodes positive with extracapsular extension.  Grade 3, ER/PR positive, HER2 negative.  HISTORY OF PRESENT ILLNESS:  [name] is a pleasant 71 year old female who palpated a right breast mass a few years before presentation. She was ultimately referred to a surgeon in [facility], who performed a skin biopsy on 03/14/2011, which showed malignant adenocarcinoma with lymphovascular invasion.  She elected to have no staging and went immediately for a mastectomy, which was performed on 03/27/2011. Pathology showed a total tumor size of 5.4 cm.  However, there was multifocal disease.  Margins were close but negative.  Nipple invasion was present with skin ulceration and multifocal skin involvement.  An axillary node dissection, upon review, revealed 8 nodes positive.  A staging PET scan on 05/01/2011 showed a hypermetabolic right paratracheal node measuring 7 mm with an SUV of 8.3.  Followup was recommended.  No evidence of metastatic disease was noted.  She has undergone chemotherapy under the care of Dr. Caron Presume and has completed 4 cycles of FEC and Taxotere.  She really did well with chemotherapy and had no treatment breaks.  She continued to work basically full time through the course of treatment.  She lives in [place].  She has no complaints today.  She is comfortable.  She can move her right arm well. She has heard some stories about radiation, which have made her very reluctant to accept treatment from patients upstairs.  She does have several friends who received  treatment and had very minimal side effects, however.  She is also concerned about heart and rib damage which she read about on the internet.  PAST MEDICAL HISTORY: 1. Uterine fibroids. 2. Hypothyroidism.  SOCIAL HISTORY:  She is a nonsmoker.  She has social alcohol consumption.  She works at a [employer] in [employer].  She is married to her husband Brandi Bowen.  She has 2 adopted children.  FAMILY HISTORY:  Her father had colon cancer at 68.  No other family history of malignancy.  REPRODUCTIVE HISTORY:  She is G0, P0, with menarche at 21 and menopause at 58, with no hormone replacement therapy use.  ALLERGIES:  She is allergic to shrimp.  REVIEW OF SYSTEMS:  As per the history of present illness.  PHYSICAL EXAMINATION:  She is a pleasant female in no distress, sitting comfortably on the examining room table.  Weight 196 pounds, height 5 feet 6 inches, blood pressure 149/89, pulse 87, temperature 98.1.  She has a well-healed right mastectomy scar.  She has very minimal lymphedema of her right arm.  She is alert and x3.  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  RECOMMENDATIONS:  [name], her husband and I discussed her treatment options and indications for radiation.  We discussed 30 to 33 treatments as an outpatient.  We discussed the process of simulation and the placement of tattoos.  We discussed the possible side effects treatment, including but not limited to skin redness and fatigue.  We discussed difficulties with reconstruction.  We discussed lung and rib  damage, as well as secondary malignancies.  She has signed informed consent and agreed to proceed forward.  We discussed trying to work her treatments in with her work schedule, scheduling as early in the morning as possible.  I spent 1 hour with the patient today.  Greater than 50% of that  time was spent in counseling and coordination of care.  I have scheduled her for simulation on the 4th with treatment to began on the 11th or the 14th.    ______________________________ Lurline Hare, M.D. SW/MEDQ  D:  11/06/2011  T:  11/06/2011  Job:  [id number]

## 2011-11-06 NOTE — Progress Notes (Signed)
Please see the Nurse Progress Note in the MD Initial Consult Encounter for this patient. 

## 2011-11-08 ENCOUNTER — Ambulatory Visit
Admission: RE | Admit: 2011-11-08 | Discharge: 2011-11-08 | Disposition: A | Discharge status: Home / Self Care | Payer: Medicare Other | Source: Ambulatory Visit | Attending: Radiation Oncology | Admitting: Radiation Oncology

## 2011-11-08 ENCOUNTER — Ambulatory Visit
Admission: RE | Admit: 2011-11-08 | Discharge: 2011-11-08 | Payer: Medicare Other | Source: Ambulatory Visit | Attending: Radiation Oncology | Admitting: Radiation Oncology

## 2011-11-08 NOTE — Patient Care Conference (Signed)
Met with patient to discuss RO billing.  Patient had no concerns today. 

## 2011-11-12 ENCOUNTER — Encounter: Payer: Self-pay | Admitting: *Deleted

## 2011-11-12 NOTE — Progress Notes (Signed)
[facility] Distress Screening  Clinical Social Work was referred by distress screening protocol. Patient scored a 6 on the Psychosocial Distress Thermometer, which indicates moderate distress. Clinical Social Worker was unable to meet with pt in the exam room; therefore contacted pt at work to assess for distress and other psychosocial needs.  Pt stated she was doing well and did not express any current needs.  CSW and pt discussed the support team and support programs at [facility].  Pt stated she was interested in the breast cancer support group, but was not comfortable driving at night.  Pt is currently working during the day, which prevented her from attending support programs during the day.  Pt has completed chemo, and will be starting radiation treatments.  CSW and pt discussed FYNN, and pt expressed interest.  CSW provided contact information for [facility] for pt to contact for further information.    Clinical Social Worker follow up needed: Not at this time  CSW encouraged pt to call with any needs or concerns.  Pt was appreciative of contact.  Tamala Julian, LCSW

## 2011-11-15 ENCOUNTER — Other Ambulatory Visit: Payer: Self-pay | Admitting: *Deleted

## 2011-11-15 ENCOUNTER — Ambulatory Visit
Admission: RE | Admit: 2011-11-15 | Discharge: 2011-11-15 | Disposition: A | Discharge status: Home / Self Care | Payer: Medicare Other | Source: Ambulatory Visit | Attending: Radiation Oncology | Admitting: Radiation Oncology

## 2011-11-15 ENCOUNTER — Encounter: Payer: Self-pay | Admitting: Radiation Oncology

## 2011-11-15 ENCOUNTER — Ambulatory Visit (HOSPITAL_BASED_OUTPATIENT_CLINIC_OR_DEPARTMENT_OTHER): Payer: Medicare Other

## 2011-11-15 DIAGNOSIS — C50919 Malignant neoplasm of unspecified site of unspecified female breast: Secondary | ICD-10-CM

## 2011-11-15 DIAGNOSIS — C773 Secondary and unspecified malignant neoplasm of axilla and upper limb lymph nodes: Secondary | ICD-10-CM

## 2011-11-15 DIAGNOSIS — Z452 Encounter for adjustment and management of vascular access device: Secondary | ICD-10-CM

## 2011-11-15 MED ORDER — SODIUM CHLORIDE 0.9 % IJ SOLN
10.0000 mL | INTRAMUSCULAR | Status: DC | PRN
  Administered 2011-11-15: 10 mL via INTRAVENOUS
  Filled 2011-11-15: qty 10

## 2011-11-15 MED ORDER — HEPARIN SOD (PORK) LOCK FLUSH 100 UNIT/ML IV SOLN
500.0000 [IU] | Freq: Once | INTRAVENOUS | Status: AC
  Administered 2011-11-15: 500 [IU] via INTRAVENOUS
  Filled 2011-11-15: qty 5

## 2011-11-15 NOTE — Progress Notes (Signed)
TEACHING DONE WITH PT , VERBALIZES UNDERSTANDING.  BOOKLET, RADIATION AND YOU, GIVEN TO PT WITH PERTINENT INFO MARKED.  RADIAPLEX AND ALRA DEODORANT GIVEN WITH INSTRUCTIONS FOR USE.  PT ASKED IF IT WOULD BE OK TO USE PLAIN ALOE ON SITE, I TOLD HER TO ASK DR. Lacretia Nicks. IF IT WAS OK

## 2011-11-15 NOTE — Progress Notes (Signed)
[facility] Radiation Oncology Simulation Verification Note   Name: Brandi Bowen MRN: [id number]   Date: 11/15/2011 DOB: 13-Feb-1941  Status:outpatient   DIAGNOSIS: Rt breast cancer  POSITION: Patient was placed in the supine position on the treatment machine.  Isocenter and MLCS were reviewed and treatment was approved.  NARRATIVE: Patient tolerated simulation well.

## 2011-11-18 ENCOUNTER — Ambulatory Visit
Admission: RE | Admit: 2011-11-18 | Discharge: 2011-11-18 | Disposition: A | Discharge status: Home / Self Care | Payer: Medicare Other | Source: Ambulatory Visit | Attending: Radiation Oncology | Admitting: Radiation Oncology

## 2011-11-19 ENCOUNTER — Ambulatory Visit
Admission: RE | Admit: 2011-11-19 | Discharge: 2011-11-19 | Disposition: A | Discharge status: Home / Self Care | Payer: Medicare Other | Source: Ambulatory Visit | Attending: Radiation Oncology | Admitting: Radiation Oncology

## 2011-11-19 DIAGNOSIS — C50919 Malignant neoplasm of unspecified site of unspecified female breast: Secondary | ICD-10-CM

## 2011-11-19 NOTE — Progress Notes (Signed)
FEELS NERVOUS ABOUT "THIS".  WORRIED ABOUT LYMPHADEMA.  OTHERWISE OK

## 2011-11-19 NOTE — Progress Notes (Signed)
Weekly Management Note Current Dose: 3.6 Gy  Projected Dose: 60.4 Gy   Narrative:  The patient presents for routine under treatment assessment.  CBCT/MVCT images/Port film x-rays were reviewed.  The chart was checked. Nervous about lymphedema. RN education performed.   Physical Findings: Weight: 192 lb 3.2 oz (87.181 kg). No skin changes.   Impression:  The patient is tolerating radiation.  Plan:  Continue treatment as planned.

## 2011-11-20 ENCOUNTER — Ambulatory Visit
Admission: RE | Admit: 2011-11-20 | Discharge: 2011-11-20 | Disposition: A | Discharge status: Home / Self Care | Payer: Medicare Other | Source: Ambulatory Visit | Attending: Radiation Oncology | Admitting: Radiation Oncology

## 2011-11-21 ENCOUNTER — Ambulatory Visit
Admission: RE | Admit: 2011-11-21 | Discharge: 2011-11-21 | Disposition: A | Discharge status: Home / Self Care | Payer: Medicare Other | Source: Ambulatory Visit | Attending: Radiation Oncology | Admitting: Radiation Oncology

## 2011-11-22 ENCOUNTER — Ambulatory Visit: Payer: Medicare Other

## 2011-11-25 ENCOUNTER — Ambulatory Visit
Admission: RE | Admit: 2011-11-25 | Discharge: 2011-11-25 | Disposition: A | Discharge status: Home / Self Care | Payer: Medicare Other | Source: Ambulatory Visit | Attending: Radiation Oncology | Admitting: Radiation Oncology

## 2011-11-26 ENCOUNTER — Ambulatory Visit
Admission: RE | Admit: 2011-11-26 | Discharge: 2011-11-26 | Disposition: A | Discharge status: Home / Self Care | Payer: Medicare Other | Source: Ambulatory Visit | Attending: Radiation Oncology | Admitting: Radiation Oncology

## 2011-11-26 DIAGNOSIS — C50919 Malignant neoplasm of unspecified site of unspecified female breast: Secondary | ICD-10-CM

## 2011-11-26 NOTE — Progress Notes (Signed)
Weekly Management Note Current Dose: 10.8  Gy  Projected Dose:  60.4 Gy   Narrative:  The patient presents for routine under treatment assessment.  CBCT/MVCT images/Port film x-rays were reviewed.  The chart was checked. Doing well. Scheduled for ABC class on February 2nd. Some tightness in right axilla no swelling. Using radiaplex.  Physical Findings: Some moles slightly darker on chest wall. No skin changes.  Impression:  The patient is tolerating radiation.  Plan:  Continue treatment as planned. Demonstrated proper use and location of radiaplex use.

## 2011-11-27 ENCOUNTER — Ambulatory Visit
Admission: RE | Admit: 2011-11-27 | Discharge: 2011-11-27 | Disposition: A | Discharge status: Home / Self Care | Payer: Medicare Other | Source: Ambulatory Visit | Attending: Radiation Oncology | Admitting: Radiation Oncology

## 2011-11-28 ENCOUNTER — Ambulatory Visit
Admission: RE | Admit: 2011-11-28 | Discharge: 2011-11-28 | Disposition: A | Discharge status: Home / Self Care | Payer: Medicare Other | Source: Ambulatory Visit | Attending: Radiation Oncology | Admitting: Radiation Oncology

## 2011-11-29 ENCOUNTER — Ambulatory Visit
Admission: RE | Admit: 2011-11-29 | Discharge: 2011-11-29 | Disposition: A | Discharge status: Home / Self Care | Payer: Medicare Other | Source: Ambulatory Visit | Attending: Radiation Oncology | Admitting: Radiation Oncology

## 2011-12-02 ENCOUNTER — Ambulatory Visit
Admission: RE | Admit: 2011-12-02 | Discharge: 2011-12-02 | Disposition: A | Discharge status: Home / Self Care | Payer: Medicare Other | Source: Ambulatory Visit | Attending: Radiation Oncology | Admitting: Radiation Oncology

## 2011-12-03 ENCOUNTER — Ambulatory Visit
Admission: RE | Admit: 2011-12-03 | Discharge: 2011-12-03 | Disposition: A | Discharge status: Home / Self Care | Payer: Medicare Other | Source: Ambulatory Visit | Attending: Radiation Oncology | Admitting: Radiation Oncology

## 2011-12-03 ENCOUNTER — Encounter: Payer: Self-pay | Admitting: Radiation Oncology

## 2011-12-03 DIAGNOSIS — C50919 Malignant neoplasm of unspecified site of unspecified female breast: Secondary | ICD-10-CM

## 2011-12-03 NOTE — Progress Notes (Signed)
Weekly Management Note Current Dose:19.8  Gy  Projected Dose: 60.4 Gy   Narrative:  The patient presents for routine under treatment assessment.  CBCT/MVCT images/Port film x-rays were reviewed.  The chart was checked. Doing well. No complaints. Physical therapy on Monday.   Physical Findings: Weight: 193 lb 14.4 oz (87.952 kg). Slight erythema over left chest wall. Alert and oriented.   Impression:  The patient is tolerating radiation.  Plan:  Continue treatment as planned. Continue radiaplex.

## 2011-12-03 NOTE — Progress Notes (Signed)
Very little skin changes,slight scant erythema r chest wall under arm, no c/o pain or discomfort, using radiaplex gel as directed 8:47 AM

## 2011-12-04 ENCOUNTER — Ambulatory Visit
Admission: RE | Admit: 2011-12-04 | Discharge: 2011-12-04 | Disposition: A | Discharge status: Home / Self Care | Payer: Medicare Other | Source: Ambulatory Visit | Attending: Radiation Oncology | Admitting: Radiation Oncology

## 2011-12-05 ENCOUNTER — Ambulatory Visit
Admission: RE | Admit: 2011-12-05 | Discharge: 2011-12-05 | Disposition: A | Discharge status: Home / Self Care | Payer: Medicare Other | Source: Ambulatory Visit | Attending: Radiation Oncology | Admitting: Radiation Oncology

## 2011-12-06 ENCOUNTER — Ambulatory Visit
Admission: RE | Admit: 2011-12-06 | Discharge: 2011-12-06 | Disposition: A | Discharge status: Home / Self Care | Payer: Medicare Other | Source: Ambulatory Visit | Attending: Radiation Oncology | Admitting: Radiation Oncology

## 2011-12-09 ENCOUNTER — Ambulatory Visit
Admission: RE | Admit: 2011-12-09 | Discharge: 2011-12-09 | Disposition: A | Discharge status: Home / Self Care | Payer: Medicare Other | Source: Ambulatory Visit | Attending: Radiation Oncology | Admitting: Radiation Oncology

## 2011-12-10 ENCOUNTER — Other Ambulatory Visit: Payer: Self-pay | Admitting: Radiation Oncology

## 2011-12-10 ENCOUNTER — Ambulatory Visit
Admission: RE | Admit: 2011-12-10 | Discharge: 2011-12-10 | Disposition: A | Discharge status: Home / Self Care | Payer: Medicare Other | Source: Ambulatory Visit | Attending: Radiation Oncology | Admitting: Radiation Oncology

## 2011-12-10 DIAGNOSIS — C50919 Malignant neoplasm of unspecified site of unspecified female breast: Secondary | ICD-10-CM

## 2011-12-10 MED ORDER — RADIAPLEXRX EX GEL
Freq: Once | CUTANEOUS | Status: AC
  Administered 2011-12-10: 09:00:00 via TOPICAL

## 2011-12-10 NOTE — Progress Notes (Signed)
Here for weekly undertreat visit with MD. Has completed 16 radiation treatments to right chest wall. Mild redness under right axilla.Pt. Will continue to apply radiaplex. Will give additional tube today. Denies fatigue or discomfort.

## 2011-12-10 NOTE — Progress Notes (Signed)
Weekly Management Note Current Dose: 32.4  Gy  Projected Dose: 61 Gy   Narrative:  The patient presents for routine under treatment assessment.  CBCT/MVCT images/Port film x-rays were reviewed.  The chart was checked. Doing well. Asked for script for compression sleeve for traveling. Some irritation of right axilla but otherwise doing well.   Physical Findings: Chest wall is slightly pink. Moles are darker. No moist desquamation.   Impression:  The patient is tolerating radiation.  Plan:  Continue treatment as planned. Continue radiaplex.

## 2011-12-11 ENCOUNTER — Ambulatory Visit
Admission: RE | Admit: 2011-12-11 | Discharge: 2011-12-11 | Disposition: A | Discharge status: Home / Self Care | Payer: Medicare Other | Source: Ambulatory Visit | Attending: Radiation Oncology | Admitting: Radiation Oncology

## 2011-12-12 ENCOUNTER — Ambulatory Visit
Admission: RE | Admit: 2011-12-12 | Discharge: 2011-12-12 | Disposition: A | Discharge status: Home / Self Care | Payer: Medicare Other | Source: Ambulatory Visit | Attending: Radiation Oncology | Admitting: Radiation Oncology

## 2011-12-13 ENCOUNTER — Ambulatory Visit
Admission: RE | Admit: 2011-12-13 | Discharge: 2011-12-13 | Disposition: A | Discharge status: Home / Self Care | Payer: Medicare Other | Source: Ambulatory Visit | Attending: Radiation Oncology | Admitting: Radiation Oncology

## 2011-12-16 ENCOUNTER — Ambulatory Visit
Admission: RE | Admit: 2011-12-16 | Discharge: 2011-12-16 | Disposition: A | Discharge status: Home / Self Care | Payer: Medicare Other | Source: Ambulatory Visit | Attending: Radiation Oncology | Admitting: Radiation Oncology

## 2011-12-17 ENCOUNTER — Ambulatory Visit
Admission: RE | Admit: 2011-12-17 | Discharge: 2011-12-17 | Disposition: A | Discharge status: Home / Self Care | Payer: Medicare Other | Source: Ambulatory Visit | Attending: Radiation Oncology | Admitting: Radiation Oncology

## 2011-12-17 DIAGNOSIS — C50919 Malignant neoplasm of unspecified site of unspecified female breast: Secondary | ICD-10-CM

## 2011-12-17 NOTE — Progress Notes (Signed)
C/o the sharpe intermittent pains , explained to her that this was the healing of the nerve endings and was normal after surgery.  No other c/o, skin bright red.  Using Radiaplex

## 2011-12-17 NOTE — Progress Notes (Signed)
Weekly Management Note Current Dose: 37.8 Gy  Projected Dose: 60.4 Gy   Narrative:  The patient presents for routine under treatment assessment.  CBCT/MVCT images/Port film x-rays were reviewed.  The chart was checked. Was fitted for sleeve. Concerned about left chest wall skin redness. Using radiaplex.  Physical Findings: Weight: 194 lb (87.998 kg). Pink left chest wall.   Impression:  The patient is tolerating radiation.  Plan:  Continue treatment as planned. Discussed time course of skin redness. Continue radiaplex.

## 2011-12-18 ENCOUNTER — Telehealth: Payer: Self-pay

## 2011-12-18 ENCOUNTER — Ambulatory Visit
Admission: RE | Admit: 2011-12-18 | Discharge: 2011-12-18 | Disposition: A | Discharge status: Home / Self Care | Payer: Medicare Other | Source: Ambulatory Visit | Attending: Radiation Oncology | Admitting: Radiation Oncology

## 2011-12-18 ENCOUNTER — Other Ambulatory Visit: Payer: Self-pay | Admitting: Physician Assistant

## 2011-12-18 ENCOUNTER — Ambulatory Visit: Admission: RE | Admit: 2011-12-18 | Payer: Medicare Other | Source: Ambulatory Visit

## 2011-12-18 NOTE — Telephone Encounter (Signed)
Received call from pt requesting script for Breast Prosthesis x 6  be faxed to [facility] [phone]; Attn: Barron Alvine.    Note to [place], [place] making this request known. dph

## 2011-12-19 ENCOUNTER — Ambulatory Visit
Admission: RE | Admit: 2011-12-19 | Discharge: 2011-12-19 | Disposition: A | Discharge status: Home / Self Care | Payer: Medicare Other | Source: Ambulatory Visit | Attending: Radiation Oncology | Admitting: Radiation Oncology

## 2011-12-20 ENCOUNTER — Ambulatory Visit
Admission: RE | Admit: 2011-12-20 | Discharge: 2011-12-20 | Disposition: A | Discharge status: Home / Self Care | Payer: Medicare Other | Source: Ambulatory Visit | Attending: Radiation Oncology | Admitting: Radiation Oncology

## 2011-12-23 ENCOUNTER — Ambulatory Visit
Admission: RE | Admit: 2011-12-23 | Discharge: 2011-12-23 | Disposition: A | Discharge status: Home / Self Care | Payer: Medicare Other | Source: Ambulatory Visit | Attending: Radiation Oncology | Admitting: Radiation Oncology

## 2011-12-24 ENCOUNTER — Ambulatory Visit
Admission: RE | Admit: 2011-12-24 | Discharge: 2011-12-24 | Disposition: A | Discharge status: Home / Self Care | Payer: Medicare Other | Source: Ambulatory Visit | Attending: Radiation Oncology | Admitting: Radiation Oncology

## 2011-12-24 DIAGNOSIS — C50919 Malignant neoplasm of unspecified site of unspecified female breast: Secondary | ICD-10-CM

## 2011-12-24 NOTE — Progress Notes (Signed)
Weekly Management Note Current Dose: 46.8  Gy  Projected Dose: 60.4 Gy   Narrative:  The patient presents for routine under treatment assessment.  CBCT/MVCT images/Port film x-rays were reviewed.  The chart was checked. Doing well. Insurance paid for prosthesis.  No pain. Marked out boost area on treatment machine.   Physical Findings: Moist desquamation in upper outer quadrant/axilla.  Impression:  The patient is tolerating radiation.  Plan:  Continue treatment as planned. Add neosporin to axilla. Continue radiaplex to rest of skin.

## 2011-12-25 ENCOUNTER — Ambulatory Visit
Admission: RE | Admit: 2011-12-25 | Discharge: 2011-12-25 | Disposition: A | Discharge status: Home / Self Care | Payer: Medicare Other | Source: Ambulatory Visit | Attending: Radiation Oncology | Admitting: Radiation Oncology

## 2011-12-25 ENCOUNTER — Encounter: Payer: Self-pay | Admitting: Radiation Oncology

## 2011-12-25 NOTE — Progress Notes (Signed)
Name: Brandi Bowen   MRN: [id number]  Date:  12/25/2011   DOB: November 15, 1940  Status:outpatient    DIAGNOSIS: Breast cancer.  CONSENT VERIFIED: yes   SET UP: Patient is setup supine   IMMOBILIZATION:  The following immobilization was used:Custom Moldable Pillow, breast board.   NARRATIVE: [name] underwent complex simulation and treatment planning for her boost treatment today.  Her tumor volume was outlined on the planning CT scan. The depth of her chest wall was 1.2 Cm.  Due to the size and angle of the scar, 2 fields were used.   6  MeV electrons will be prescribed to the 90% Isodose line with 0.5 cm bolus.   2 blocks will be used for beam modification purposes.  A special port plan is requested.

## 2011-12-26 ENCOUNTER — Ambulatory Visit
Admission: RE | Admit: 2011-12-26 | Discharge: 2011-12-26 | Disposition: A | Discharge status: Home / Self Care | Payer: Medicare Other | Source: Ambulatory Visit | Attending: Radiation Oncology | Admitting: Radiation Oncology

## 2011-12-27 ENCOUNTER — Ambulatory Visit
Admission: RE | Admit: 2011-12-27 | Discharge: 2011-12-27 | Disposition: A | Discharge status: Home / Self Care | Payer: Medicare Other | Source: Ambulatory Visit | Attending: Radiation Oncology | Admitting: Radiation Oncology

## 2011-12-27 ENCOUNTER — Ambulatory Visit (HOSPITAL_BASED_OUTPATIENT_CLINIC_OR_DEPARTMENT_OTHER): Payer: Medicare Other

## 2011-12-27 VITALS — BP 143/92 | HR 97 | Temp 98.6°F

## 2011-12-27 DIAGNOSIS — C50919 Malignant neoplasm of unspecified site of unspecified female breast: Secondary | ICD-10-CM

## 2011-12-27 MED ORDER — SODIUM CHLORIDE 0.9 % IJ SOLN
10.0000 mL | INTRAMUSCULAR | Status: DC | PRN
  Administered 2011-12-27: 10 mL via INTRAVENOUS
  Filled 2011-12-27: qty 10

## 2011-12-27 MED ORDER — HEPARIN SOD (PORK) LOCK FLUSH 100 UNIT/ML IV SOLN
500.0000 [IU] | Freq: Once | INTRAVENOUS | Status: AC
  Administered 2011-12-27: 500 [IU] via INTRAVENOUS
  Filled 2011-12-27: qty 5

## 2011-12-30 ENCOUNTER — Ambulatory Visit: Payer: Medicare Other

## 2011-12-30 ENCOUNTER — Ambulatory Visit
Admission: RE | Admit: 2011-12-30 | Discharge: 2011-12-30 | Disposition: A | Discharge status: Home / Self Care | Payer: Medicare Other | Source: Ambulatory Visit | Attending: Radiation Oncology | Admitting: Radiation Oncology

## 2011-12-31 ENCOUNTER — Ambulatory Visit
Admission: RE | Admit: 2011-12-31 | Discharge: 2011-12-31 | Disposition: A | Discharge status: Home / Self Care | Payer: Medicare Other | Source: Ambulatory Visit | Attending: Radiation Oncology | Admitting: Radiation Oncology

## 2011-12-31 ENCOUNTER — Encounter: Payer: Self-pay | Admitting: Radiation Oncology

## 2011-12-31 DIAGNOSIS — C50919 Malignant neoplasm of unspecified site of unspecified female breast: Secondary | ICD-10-CM

## 2011-12-31 NOTE — Progress Notes (Signed)
Weekly Management Note Current Dose: 56.4  Gy  Projected Dose: 60.4 Gy   Narrative:  The patient presents for routine under treatment assessment.  CBCT/MVCT images/Port film x-rays were reviewed.  The chart was checked. Doing well. Lymphedema sleeve has been ordered from [facility]. Pt has no lymphedema currently.  Some dry desquamation over chest wall which is painful mostly when she wears a bra. Using radiaplex and neosporin.   Physical Findings: Weight: 193 lb 1.6 oz (87.59 kg). Dry desquamation and peeling over right chest wall. Worse in axilla. No moist desquamation.   Impression:  The patient is tolerating radiation.  Plan:  Continue treatment as planned. Continue current regimen. Switch to lotion with vit e after 2 weeks. Add hydrogel for comfort. F/u with PR next week. F/U with me in 1 month.

## 2011-12-31 NOTE — Progress Notes (Signed)
[employer] [phone] with APril, pt wasn't measured there, but stated"If no swelling ,she should get a 20/30 compression sleeve",thanked April, called [facility] at [phone] with Dewayne Hatch, "Yes, patient was measured here,we have both the 20/30 and 30/40 sleeve,  ",informed her per Dr. Michell Heinrich to use 20/30 compression sleeve,, will fax note to the at [phone], Dewayne Hatch to call patient and set up with patient to come in and pick up the compression sleeve, thanked her, faxed note .9:21 AM

## 2011-12-31 NOTE — Progress Notes (Signed)
[employer] [phone] with APril, pt wasn't measured there, but stated"If no swelling ,she should get a 20/30 compression sleeve",thanked April, called [facility] at [phone] with Dewayne Hatch, "Yes, patient was measured here,we have both the 20/30 and 30/40 sleeve,  ",informed her per Dr. Michell Heinrich to use 20/30 compression sleeve,, will fax note to the at [phone], Dewayne Hatch to call patient and set up with patient to come in and pick up the compression sleeve, thanked her, faxed note .9:26 AM

## 2011-12-31 NOTE — Progress Notes (Signed)
Pt right chest wall with dry desquamation, pt using radiaplex gel and neosporin under axilla, , pt given [facility] flyer, will think about going in summer, pt asking about lymphedema sleeve, she was measured, but needs to know whether 20/30 compression or 30/40, and will fax to [facility] at [phone]  Phone [phone], pt c/o burning some and mostly hurting  End of chest wall where erythema more pronounced  8:23 AM

## 2012-01-01 ENCOUNTER — Ambulatory Visit
Admission: RE | Admit: 2012-01-01 | Discharge: 2012-01-01 | Disposition: A | Discharge status: Home / Self Care | Payer: Medicare Other | Source: Ambulatory Visit | Attending: Radiation Oncology | Admitting: Radiation Oncology

## 2012-01-02 ENCOUNTER — Ambulatory Visit
Admission: RE | Admit: 2012-01-02 | Discharge: 2012-01-02 | Disposition: A | Discharge status: Home / Self Care | Payer: Medicare Other | Source: Ambulatory Visit | Attending: Radiation Oncology | Admitting: Radiation Oncology

## 2012-01-03 ENCOUNTER — Ambulatory Visit: Payer: Medicare Other

## 2012-01-08 ENCOUNTER — Other Ambulatory Visit: Payer: Self-pay

## 2012-01-08 DIAGNOSIS — C50919 Malignant neoplasm of unspecified site of unspecified female breast: Secondary | ICD-10-CM

## 2012-01-09 ENCOUNTER — Telehealth: Payer: Self-pay | Admitting: Oncology

## 2012-01-09 ENCOUNTER — Other Ambulatory Visit (HOSPITAL_BASED_OUTPATIENT_CLINIC_OR_DEPARTMENT_OTHER): Payer: Medicare Other | Admitting: Lab

## 2012-01-09 ENCOUNTER — Ambulatory Visit (HOSPITAL_BASED_OUTPATIENT_CLINIC_OR_DEPARTMENT_OTHER): Payer: Medicare Other | Admitting: Physician Assistant

## 2012-01-09 VITALS — BP 111/74 | HR 109 | Temp 98.6°F | Ht 66.0 in | Wt 194.3 lb

## 2012-01-09 DIAGNOSIS — E559 Vitamin D deficiency, unspecified: Secondary | ICD-10-CM

## 2012-01-09 DIAGNOSIS — Z9010 Acquired absence of unspecified breast and nipple: Secondary | ICD-10-CM

## 2012-01-09 DIAGNOSIS — C50919 Malignant neoplasm of unspecified site of unspecified female breast: Secondary | ICD-10-CM

## 2012-01-09 DIAGNOSIS — Z170 Estrogen receptor positive status [ER+]: Secondary | ICD-10-CM

## 2012-01-09 LAB — CBC WITH DIFFERENTIAL/PLATELET
Basophils Absolute: 0 10*3/uL (ref 0.0–0.1)
EOS%: 4.9 % (ref 0.0–7.0)
HGB: 13.1 g/dL (ref 11.6–15.9)
MCH: 29.8 pg (ref 25.1–34.0)
MCHC: 34.2 g/dL (ref 31.5–36.0)
MCV: 87.2 fL (ref 79.5–101.0)
MONO%: 11.1 % (ref 0.0–14.0)
RDW: 14.4 % (ref 11.2–14.5)

## 2012-01-09 LAB — COMPREHENSIVE METABOLIC PANEL
AST: 62 U/L — ABNORMAL HIGH (ref 0–37)
Alkaline Phosphatase: 82 U/L (ref 39–117)
BUN: 16 mg/dL (ref 6–23)
Creatinine, Ser: 0.87 mg/dL (ref 0.50–1.10)
Potassium: 3.9 mEq/L (ref 3.5–5.3)

## 2012-01-09 NOTE — Progress Notes (Signed)
Hematology and Oncology Follow Up Visit  Brandi Bowen [id number] [birth year] 71 y.o. 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 She has completed 4/4 cycles of q.3 week FEC and 4/4 planned q.3 week doses of adjuvant Taxotere  with Neulasta support on day 2.  2. Radiation therapy completed on 01/02/2012.   Interim History:   Brandi Bowen is seen today for followup after completion of radiation therapy to the right chest wall supraclavicular and axillary region. She is here to discuss adjuvant hormonal therapy. She is feeling well, denying any unexplained fevers, chills, night sweats, shortness of breath or chest pain. She'll radiation quite well. She denies any nausea, emesis, diarrhea or constipation issues. Her overall energy level is improving. 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 She states that she's never had a baseline bone density. She does take vitamin D and calcium replacement therapy.  A detailed review of systems is otherwise noncontributory as noted below.  Review of Systems: Constitutional:  no weight loss, fever, night sweats and feels well Eyes: no complaints ENT: no complaints Cardiovascular: no chest pain or dyspnea on exertion Respiratory: no cough, shortness of breath, or wheezing Neurological: no TIA or stroke symptoms Dermatological: negative Gastrointestinal: no abdominal pain, change in bowel habits, or black or bloody stools Genito-Urinary: no dysuria, trouble voiding, or hematuria Hematological and Lymphatic: negative Breast: negative Musculoskeletal: negative Remaining ROS negative.   Medications:   I have reviewed the patient's current  medications.  Current Outpatient Prescriptions  Medication Sig Dispense Refill  . levothyroxine (SYNTHROID, LEVOTHROID) 100 MCG tablet Take 100 mcg by mouth daily. *in Mos[place]q as - please review with patient to confirm dose       . metFORMIN (GLUCOPHAGE) 500 MG tablet Take 500 mg by mouth 2 (two) times daily with a meal.        . Multiple Vitamin (MULTIVITAMIN) capsule Take 1 capsule by mouth daily.         No current facility-administered medications for this visit.   Facility-Administered Medications Ordered in Other Visits  Medication Dose Route Frequency Provider Last Rate Last Dose  . sodium chloride 0.9 % injection 10 mL  10 mL Intravenous PRN Pierce Crane, MD   10 mL at 11/15/11 0935    Allergies:  Allergies  Allergen Reactions  . Shrimp (Shellfish Allergy)     Physical Exam: Filed Vitals:   01/09/12 1500  BP: 111/74  Pulse: 109  Temp: 98.6 F (37 C)    Body mass index is 31.36 kg/(m^2). Weight: 194 lbs. HEENT:  Sclerae anicteric, conjunctivae pink.  Oropharynx clear.  No mucositis or candidiasis.   Nodes:  No cervical, supraclavicular, or axillary lymphadenopathy palpated.  Breast Exam:  Deferred. Lungs:  Clear to auscultation bilaterally.  No crackles, rhonchi, or wheezes.   Heart:  Regular rate and rhythm.   Abdomen:  Soft, nontender.  Positive bowel sounds.  No organomegaly or masses palpated.   Musculoskeletal:  No focal spinal tenderness to palpation.  Extremities:  Benign.  No peripheral edema or cyanosis.   Skin:  Benign.   Neuro:  Nonfocal, alert and oriented x 3.   Lab Results: Lab Results  Component Value Date   WBC 4.0 01/09/2012   HGB 13.1 01/09/2012   HCT 38.2  01/09/2012   MCV 87.2 01/09/2012   PLT 240 01/09/2012   NEUTROABS 2.2 01/09/2012     Chemistry      Component Value Date/Time   NA 139 01/09/2012 1427   K 3.9 01/09/2012 1427   CL 103 01/09/2012 1427   CO2 25 01/09/2012 1427   BUN 16 01/09/2012 1427   CREATININE 0.87 01/09/2012 1427      Component  Value Date/Time   CALCIUM 10.2 01/09/2012 1427   ALKPHOS 82 01/09/2012 1427   AST 62* 01/09/2012 1427   ALT 61* 01/09/2012 1427   BILITOT 0.3 01/09/2012 1427      Lab Results  Component Value Date   LABCA2 41* 04/30/2011      Assessment:  A 71 year old, [place], [place] woman with a history of a 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 She has completed 4/4 cycles of q.3 week FEC and 4/4 planned q.3 week doses of adjuvant Taxotere  with Neulasta support on day 2.  2. Radiation therapy completed on 01/02/2012.  Case reviewed with Dr. Pierce Crane.  Plan:  Brandi Bowen will initiate Arimidex 1 mg by mouth daily. Side effect profile has been reviewed, questions have been asked. We will also obtain a baseline bone density, we have reiterated the need to take supplemental vitamin D along with calcium replacement therapy. We will see her back in 6 weeks for followup, though she does contact us sooner if any difficulty should arise.   This plan was reviewed with the patient, who voices understanding and agreement.  She knows to call with any changes or problems.    Cort Dragoo T, PA-C 01/09/2012

## 2012-01-09 NOTE — Patient Instructions (Signed)
Please add an additional Vit D3 1000 iu to your daily regimen.  We are going to start you on Arimidex 1mg  tabs daily. (anastrozole)--please call for any side effects (hot flashes, joint pain, irritability etc.)  We will obtain a baseline Bone Density  Plan a follow up exam in 6 weeks.

## 2012-01-09 NOTE — Telephone Encounter (Signed)
appt made and printed for pt,l/m at [facility] for dexa,pt estab there abd stated she might call as well,but advised to call us back if needed   aom

## 2012-01-23 ENCOUNTER — Ambulatory Visit
Admission: RE | Admit: 2012-01-23 | Discharge: 2012-01-23 | Disposition: A | Discharge status: Home / Self Care | Payer: Medicare Other | Source: Ambulatory Visit | Attending: Radiation Oncology | Admitting: Radiation Oncology

## 2012-01-23 ENCOUNTER — Encounter: Payer: Self-pay | Admitting: Radiation Oncology

## 2012-01-23 VITALS — BP 139/86 | HR 96 | Temp 97.1°F | Resp 18 | Wt 188.4 lb

## 2012-01-23 DIAGNOSIS — C50919 Malignant neoplasm of unspecified site of unspecified female breast: Secondary | ICD-10-CM

## 2012-01-23 NOTE — Progress Notes (Signed)
   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 Shrimp (Shellfish Allergy)     Medications:  Current Outpatient Prescriptions  Medication Sig Dispense Refill  . anastrozole (ARIMIDEX) 1 MG tablet Take 1 mg by mouth daily.      . calcium carbonate 200 MG capsule Take 250 mg by mouth 2 (two) times daily with a meal.      . levothyroxine (SYNTHROID, LEVOTHROID) 100 MCG tablet Take 100 mcg by mouth daily. *in Mos[place]q as - please review with patient to confirm dose       . metFORMIN (GLUCOPHAGE) 500 MG tablet Take 500 mg by mouth 2 (two) times daily with a meal.        . Multiple Vitamin (MULTIVITAMIN) capsule Take 1 capsule by mouth daily.         No current facility-administered medications for this encounter.   Facility-Administered Medications Ordered in Other Encounters  Medication Dose Route Frequency Provider Last Rate Last Dose  . sodium chloride 0.9 % injection 10 mL  10 mL Intravenous PRN Pierce Crane, MD   10 mL at 11/15/11 0935    Interval History: Brandi Bowen presents today for routine followup.  She has recovered well from treatment. She received her lymphedema sleeve. She received 20-30 mmHg impression. She requested information on its use. She has started her Arimidex and is tolerating that well. She has an appointment with medical oncology at the end of April. She has a bone density scan scheduled for tomorrow. She is continuing to use radio Plavix.  Physical Exam:   weight is 188 lb 6.4 oz (85.458 kg). Her oral temperature is 97.1 F (36.2 C). Her blood pressure is 139/86 and her pulse is 96. Her respiration is 18.  She has a well-healed mastectomy scar on  the right side. Her moles are still slightly darker on the right than on the left. The rest of her skin has healed up well. In the lateral aspect of the chest wall are 2 areas which appear to be healed moist desquamation. The skin is intact.  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 female who has recovered well from treatment. We talked about using lotion with vitamin E. We discussed the use of her lymphedema sleeve when performing repetitive activities. She will continue followup with Dr. Donnie Coffin. I have not scheduled followup with me.  She knows she can contact me with any questions or concerns in the future.    Lurline Hare, MD

## 2012-01-23 NOTE — Progress Notes (Signed)
HERE TODAY FOR FU RIGHT BREAST TX.  HAS NO C/O TODAY AND SAYS SHE HAS HAD NO PROBLEMS SINCE LAST TX.  STILL HAS SOME REDNESS AT INCISION SITE AND UNDER ARM, ALSO APPEARS TO BE SMALL AREA OF DESQUAMATION, PINK, AND SHE SAYS STILL A LITTLE SORE.  SHE CONTINUES TO USE RADIAPLEX

## 2012-01-24 ENCOUNTER — Ambulatory Visit
Admission: RE | Admit: 2012-01-24 | Discharge: 2012-01-24 | Disposition: A | Discharge status: Home / Self Care | Payer: Medicare Other | Source: Ambulatory Visit | Attending: Physician Assistant | Admitting: Physician Assistant

## 2012-01-24 DIAGNOSIS — E559 Vitamin D deficiency, unspecified: Secondary | ICD-10-CM

## 2012-01-24 DIAGNOSIS — C50919 Malignant neoplasm of unspecified site of unspecified female breast: Secondary | ICD-10-CM

## 2012-01-30 ENCOUNTER — Encounter: Payer: Self-pay | Admitting: Radiation Oncology

## 2012-01-30 NOTE — Progress Notes (Signed)
Name: Brandi Bowen   MRN: [id number]  Date:  11/08/2011  DOB: [birth year]  Status:outpatient    DIAGNOSIS: Breast cancer.  CONSENT VERIFIED: yes   SET UP: Patient is setup supine   IMMOBILIZATION:  The following immobilization was used:Custom Moldable Pillow, breast board.   NARRATIVE: [name] was brought to the CT Simulation planning suite.  Identity was confirmed.  All relevant records and images related to the planned course of therapy were reviewed.  Then, the patient was positioned in a stable reproducible clinical set-up for radiation therapy.  Wires were placed to delineate the clinical extent of breast tissue. A wire was placed on the scar as well.  BBs were placed on her drain sites. CT images were obtained.  An isocenter was placed. Skin markings were placed.  The CT images were loaded into the planning software where the target and avoidance structures were contoured.  The radiation prescription was entered and confirmed. The patient was discharged in stable condition and tolerated simulation well.    TREATMENT PLANNING NOTE:  Treatment planning then occurred. I have requested : MLC's, isodose plan, basic dose calculation

## 2012-01-30 NOTE — Progress Notes (Signed)
  Radiation Oncology         [phone] ________________________________  Name: Brandi Bowen MRN: [id number]  Date: 01/30/2012  DOB: [birth year]  End of Treatment Note  Diagnosis:     Stage III Invasive Ductal Carcinoma of the Right Breast   Indication for treatment: Curative      Radiation treatment dates:   11/18/11-01/02/12  Site/dose:    1) Right Chest Wall / 50.4 Gy at 1.8 Gy per fraction 2) Right PAB/ 45 Gy at 1.8 Gy per fraciton 3) Right Supraclavicular fossa / 45 Gy at 1.8 Gy per fraction 4) Right Scar Boost/ 10 Gy at 2 Gy per fraction   Beams/energy:    1) Opposed tangents with reduced fields/ 6 & 10 MV photons 2) Right posterior oblique/ 6 MV photons 3) Right anterior oblique / 10 MV photons 4) Enface electrons/ 6 MeV electrons  Narrative: The patient tolerated radiation treatment relatively well.   She had the expected dry desquamation.  She was fitted for a compression sleeve prophylactically.   Plan: The patient has completed radiation treatment. The patient will return to radiation oncology clinic for routine followup in one month. I advised them to call or return sooner if they have any questions or concerns related to their recovery or treatment. ________________________________  Artist Pais. Kathrynn Running, M.D.

## 2012-02-07 ENCOUNTER — Ambulatory Visit (HOSPITAL_BASED_OUTPATIENT_CLINIC_OR_DEPARTMENT_OTHER): Payer: Medicare Other

## 2012-02-07 DIAGNOSIS — C50919 Malignant neoplasm of unspecified site of unspecified female breast: Secondary | ICD-10-CM

## 2012-02-07 DIAGNOSIS — Z469 Encounter for fitting and adjustment of unspecified device: Secondary | ICD-10-CM

## 2012-02-07 MED ORDER — SODIUM CHLORIDE 0.9 % IJ SOLN
10.0000 mL | INTRAMUSCULAR | Status: DC | PRN
  Administered 2012-02-07: 10 mL via INTRAVENOUS
  Filled 2012-02-07: qty 10

## 2012-02-07 MED ORDER — HEPARIN SOD (PORK) LOCK FLUSH 100 UNIT/ML IV SOLN
500.0000 [IU] | Freq: Once | INTRAVENOUS | Status: AC
  Administered 2012-02-07: 500 [IU] via INTRAVENOUS
  Filled 2012-02-07: qty 5

## 2012-02-25 ENCOUNTER — Other Ambulatory Visit (HOSPITAL_BASED_OUTPATIENT_CLINIC_OR_DEPARTMENT_OTHER): Payer: Medicare Other | Admitting: Lab

## 2012-02-25 ENCOUNTER — Ambulatory Visit (HOSPITAL_BASED_OUTPATIENT_CLINIC_OR_DEPARTMENT_OTHER): Payer: Medicare Other | Admitting: Physician Assistant

## 2012-02-25 ENCOUNTER — Telehealth: Payer: Self-pay | Admitting: Oncology

## 2012-02-25 ENCOUNTER — Encounter: Payer: Self-pay | Admitting: Physician Assistant

## 2012-02-25 VITALS — BP 137/76 | HR 106 | Temp 98.6°F | Ht 66.0 in | Wt 192.9 lb

## 2012-02-25 DIAGNOSIS — E559 Vitamin D deficiency, unspecified: Secondary | ICD-10-CM

## 2012-02-25 DIAGNOSIS — C50919 Malignant neoplasm of unspecified site of unspecified female breast: Secondary | ICD-10-CM

## 2012-02-25 LAB — CBC WITH DIFFERENTIAL/PLATELET
BASO%: 0.5 % (ref 0.0–2.0)
Basophils Absolute: 0 10*3/uL (ref 0.0–0.1)
EOS%: 3.7 % (ref 0.0–7.0)
HGB: 13.3 g/dL (ref 11.6–15.9)
MCH: 28.9 pg (ref 25.1–34.0)
RBC: 4.61 10*6/uL (ref 3.70–5.45)
RDW: 14.4 % (ref 11.2–14.5)
lymph#: 1.4 10*3/uL (ref 0.9–3.3)
nRBC: 0 % (ref 0–0)

## 2012-02-25 NOTE — Progress Notes (Signed)
Hematology and Oncology Follow Up Visit  Brandi Bowen [id number] [birth year] 71 y.o. 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 She has completed 4/4 cycles of q.3 week FEC and 4/4 planned q.3 week doses of adjuvant Taxotere with Neulasta support on day 2. Now on Arimidex 1mg  daily, after initation on 01/09/12. 2. Radiation therapy completed on 01/02/2012.   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 She is tolerating it well, denying any unexplained fevers, chills, or night sweats. She does have occasional "hot flashes", but notes that they're not overly troublesome. Her appetite is Without nausea, emesis, diarrhea, or constipation issues. She denies any joint sensitivity or stiffness, she denies any long bone pain. She is unaware of any headaches vision changes or gait disturbances. A detailed review of systems is otherwise noncontributory as noted below.  Review of Systems: Constitutional:  no weight loss, fever, night sweats and feels well Eyes: no complaints ENT: no complaints Cardiovascular: no chest pain or dyspnea on exertion Respiratory: no cough, shortness of breath, or wheezing Neurological: no TIA or stroke symptoms Dermatological: negative Gastrointestinal: no abdominal pain, change in bowel habits, or black or bloody stools Genito-Urinary: no dysuria, trouble voiding, or hematuria Hematological and Lymphatic: negative Breast: negative Musculoskeletal: negative Remaining ROS negative.   Medications:   I have reviewed the patient's current medications.  Current  Outpatient Prescriptions  Medication Sig Dispense Refill  . anastrozole (ARIMIDEX) 1 MG tablet Take 1 mg by mouth daily.      . calcium carbonate 200 MG capsule Take 250 mg by mouth 2 (two) times daily with a meal.      . levothyroxine (SYNTHROID, LEVOTHROID) 100 MCG tablet Take 100 mcg by mouth daily. *in [place] as - please review with patient to confirm dose       . metFORMIN (GLUCOPHAGE) 500 MG tablet Take 500 mg by mouth 2 (two) times daily with a meal.        . Multiple Vitamin (MULTIVITAMIN) capsule Take 1 capsule by mouth daily.         No current facility-administered medications for this visit.   Facility-Administered Medications Ordered in Other Visits  Medication Dose Route Frequency Provider Last Rate Last Dose  . sodium chloride 0.9 % injection 10 mL  10 mL Intravenous PRN Pierce Crane, MD   10 mL at 11/15/11 0935    Allergies:  Allergies  Allergen Reactions  . Shrimp (Shellfish Allergy)     Physical Exam: Filed Vitals:   02/25/12 0835  BP: 137/76  Pulse: 106  Temp: 98.6 F (37 C)    Body mass index is 31.13 kg/(m^2). Weight: 192 lbs. HEENT:  Sclerae anicteric, conjunctivae pink.  Oropharynx clear.  No mucositis or candidiasis.   Nodes:  No cervical, supraclavicular, or axillary lymphadenopathy palpated.  Breast Exam:  The right chest wall still has some minimal radiation change present, but no skin nodules or masses. Seborrheic keratosis are appreciated. No axillary fullness or lymphadenopathy. The left breast was examined, it is free of masses, skin changes nipple inversion or discharge axilla is clear. Again the skin is involved with seborrheic keratoses.  Lungs:  Clear to auscultation bilaterally.  No crackles, rhonchi, or wheezes.   Heart:  Regular rate and rhythm.   Abdomen:  Soft, nontender.  Positive bowel sounds.  No organomegaly or masses palpated.   Musculoskeletal:  No focal spinal tenderness to palpation.  Extremities:  Benign.  No peripheral edema or  cyanosis.   Skin:  Benign.   Neuro:  Nonfocal, alert and oriented x 3.   Lab Results: Lab Results  Component Value Date   WBC 4.3 02/25/2012   HGB 13.3 02/25/2012   HCT 39.1 02/25/2012   MCV 84.8 02/25/2012   PLT 245 02/25/2012   NEUTROABS 2.5 02/25/2012     Chemistry      Component Value Date/Time   NA 139 01/09/2012 1427   K 3.9 01/09/2012 1427   CL 103 01/09/2012 1427   CO2 25 01/09/2012 1427   BUN 16 01/09/2012 1427   CREATININE 0.87 01/09/2012 1427      Component Value Date/Time   CALCIUM 10.2 01/09/2012 1427   ALKPHOS 82 01/09/2012 1427   AST 62* 01/09/2012 1427   ALT 61* 01/09/2012 1427   BILITOT 0.3 01/09/2012 1427      Lab Results  Component Value Date   LABCA2 41* 04/30/2011   Radiographic data: 01/24/12 DUAL X-RAY ABSORPTIOMETRY (DXA) FOR BONE MINERAL DENSITY AP LUMBAR SPINE L1-L4  Bone Mineral Density (BMD): 1.097 g/cm2  Young Adult T Score: 0.5 Z Score: 2.7  LEFT FEMUR NECK  Bone Mineral Density (BMD): 0.737 g/cm2  Young Adult T Score: -1.0 Z Score: 0.9  ASSESSMENT: Patient's diagnostic category is NORMAL by WHO Criteria.  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 She has completed 4/4 cycles of q.3 week FEC and 4/4 planned q.3 week doses of adjuvant Taxotere with Neulasta support on day 2. Currently on Arimidex 1mg  per day. 2. Radiation therapy completed on 01/02/2012.  Case reviewed with Dr. Pierce Crane.  Plan:  Brandi Bowen will continue Arimidex 1 mg by mouth daily. She will return as scheduled in mid May for her port today flush. We will plan on seeing her back in 3 months for routine followup to include a CBC, serum chemistry, LDH, and CA 27-29. She knows to contact us sooner if the need  should arise.  This plan was reviewed with the patient, who voices understanding and agreement.  She knows to call with any changes or problems.    Kassia Demarinis T, PA-C 02/25/2012

## 2012-02-25 NOTE — Telephone Encounter (Signed)
gve the pt her June,july 2013 appt calendar 

## 2012-02-26 LAB — COMPREHENSIVE METABOLIC PANEL
ALT: 17 U/L (ref 0–35)
AST: 27 U/L (ref 0–37)
Albumin: 4.6 g/dL (ref 3.5–5.2)
Alkaline Phosphatase: 61 U/L (ref 39–117)
Potassium: 4.3 mEq/L (ref 3.5–5.3)
Sodium: 141 mEq/L (ref 135–145)
Total Protein: 6.6 g/dL (ref 6.0–8.3)

## 2012-03-19 ENCOUNTER — Ambulatory Visit (HOSPITAL_BASED_OUTPATIENT_CLINIC_OR_DEPARTMENT_OTHER): Payer: Medicare Other

## 2012-03-19 VITALS — BP 116/70 | HR 108 | Temp 97.3°F

## 2012-03-19 DIAGNOSIS — C50919 Malignant neoplasm of unspecified site of unspecified female breast: Secondary | ICD-10-CM

## 2012-03-19 DIAGNOSIS — Z452 Encounter for adjustment and management of vascular access device: Secondary | ICD-10-CM

## 2012-03-19 MED ORDER — SODIUM CHLORIDE 0.9 % IJ SOLN
10.0000 mL | INTRAMUSCULAR | Status: DC | PRN
  Administered 2012-03-19: 10 mL via INTRAVENOUS
  Filled 2012-03-19: qty 10

## 2012-03-19 MED ORDER — HEPARIN SOD (PORK) LOCK FLUSH 100 UNIT/ML IV SOLN
500.0000 [IU] | Freq: Once | INTRAVENOUS | Status: AC
  Administered 2012-03-19: 500 [IU] via INTRAVENOUS
  Filled 2012-03-19: qty 5

## 2012-03-19 NOTE — Patient Instructions (Signed)
Call MD for problems 

## 2012-04-02 ENCOUNTER — Other Ambulatory Visit: Payer: Self-pay | Admitting: Oncology

## 2012-04-02 DIAGNOSIS — Z9011 Acquired absence of right breast and nipple: Secondary | ICD-10-CM

## 2012-04-02 DIAGNOSIS — Z1231 Encounter for screening mammogram for malignant neoplasm of breast: Secondary | ICD-10-CM

## 2012-04-23 ENCOUNTER — Ambulatory Visit: Payer: Medicare Other

## 2012-04-29 ENCOUNTER — Ambulatory Visit
Admission: RE | Admit: 2012-04-29 | Discharge: 2012-04-29 | Disposition: A | Discharge status: Home / Self Care | Payer: Medicare Other | Source: Ambulatory Visit | Attending: Oncology | Admitting: Oncology

## 2012-04-29 DIAGNOSIS — Z1231 Encounter for screening mammogram for malignant neoplasm of breast: Secondary | ICD-10-CM

## 2012-04-29 DIAGNOSIS — Z9011 Acquired absence of right breast and nipple: Secondary | ICD-10-CM

## 2012-04-30 ENCOUNTER — Ambulatory Visit (HOSPITAL_BASED_OUTPATIENT_CLINIC_OR_DEPARTMENT_OTHER): Payer: Medicare Other

## 2012-04-30 VITALS — BP 114/78 | HR 78 | Temp 97.7°F

## 2012-04-30 DIAGNOSIS — Z452 Encounter for adjustment and management of vascular access device: Secondary | ICD-10-CM

## 2012-04-30 DIAGNOSIS — C50919 Malignant neoplasm of unspecified site of unspecified female breast: Secondary | ICD-10-CM

## 2012-04-30 MED ORDER — SODIUM CHLORIDE 0.9 % IJ SOLN
10.0000 mL | INTRAMUSCULAR | Status: DC | PRN
  Administered 2012-04-30: 10 mL via INTRAVENOUS
  Filled 2012-04-30: qty 10

## 2012-04-30 MED ORDER — HEPARIN SOD (PORK) LOCK FLUSH 100 UNIT/ML IV SOLN
500.0000 [IU] | Freq: Once | INTRAVENOUS | Status: AC
  Administered 2012-04-30: 500 [IU] via INTRAVENOUS
  Filled 2012-04-30: qty 5

## 2012-04-30 NOTE — Patient Instructions (Signed)
Call MD for problems 

## 2012-05-21 ENCOUNTER — Other Ambulatory Visit (HOSPITAL_BASED_OUTPATIENT_CLINIC_OR_DEPARTMENT_OTHER): Payer: Medicare Other | Admitting: Lab

## 2012-05-21 ENCOUNTER — Telehealth: Payer: Self-pay | Admitting: *Deleted

## 2012-05-21 ENCOUNTER — Ambulatory Visit (HOSPITAL_BASED_OUTPATIENT_CLINIC_OR_DEPARTMENT_OTHER): Payer: Medicare Other | Admitting: Oncology

## 2012-05-21 VITALS — BP 136/83 | HR 90 | Temp 98.2°F | Ht 66.0 in | Wt 196.0 lb

## 2012-05-21 DIAGNOSIS — C50919 Malignant neoplasm of unspecified site of unspecified female breast: Secondary | ICD-10-CM

## 2012-05-21 DIAGNOSIS — E559 Vitamin D deficiency, unspecified: Secondary | ICD-10-CM

## 2012-05-21 DIAGNOSIS — Z170 Estrogen receptor positive status [ER+]: Secondary | ICD-10-CM

## 2012-05-21 LAB — LACTATE DEHYDROGENASE: LDH: 179 U/L (ref 94–250)

## 2012-05-21 LAB — COMPREHENSIVE METABOLIC PANEL
CO2: 27 mEq/L (ref 19–32)
Calcium: 10.5 mg/dL (ref 8.4–10.5)
Chloride: 100 mEq/L (ref 96–112)
Glucose, Bld: 148 mg/dL — ABNORMAL HIGH (ref 70–99)
Sodium: 139 mEq/L (ref 135–145)
Total Bilirubin: 0.3 mg/dL (ref 0.3–1.2)
Total Protein: 7.1 g/dL (ref 6.0–8.3)

## 2012-05-21 LAB — CBC WITH DIFFERENTIAL/PLATELET
BASO%: 0.7 % (ref 0.0–2.0)
EOS%: 3.4 % (ref 0.0–7.0)
LYMPH%: 29.7 % (ref 14.0–49.7)
MCH: 30 pg (ref 25.1–34.0)
MCHC: 33.3 g/dL (ref 31.5–36.0)
MONO#: 0.3 10*3/uL (ref 0.1–0.9)
RBC: 4.35 10*6/uL (ref 3.70–5.45)
WBC: 4.5 10*3/uL (ref 3.9–10.3)
lymph#: 1.3 10*3/uL (ref 0.9–3.3)

## 2012-05-21 LAB — CANCER ANTIGEN 27.29: CA 27.29: 30 U/mL (ref 0–39)

## 2012-05-21 NOTE — Progress Notes (Signed)
Hematology and Oncology Follow Up Visit  Brandi Bowen [id number] [birth year] 71 y.o. 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 She has completed 4/4 cycles of q.3 week FEC and 4/4 planned q.3 week doses of adjuvant Taxotere with Neulasta support on day 2. Now on Arimidex 1mg  daily, after initation on 01/09/12. 2. Radiation therapy completed on 01/02/2012.   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 She is tolerating it well, denying any unexplained fevers, chills, or night sweats. She is having no symptoms of Arimidex. She feels well. She had a mammogram of her left breast in June but also reviewed her bone density test which is essentially normal. Her current available labs are also within normal limits.  Review of Systems: Constitutional:  no weight loss, fever, night sweats and feels well Eyes: no complaints ENT: no complaints Cardiovascular: no chest pain or dyspnea on exertion Respiratory: no cough, shortness of breath, or wheezing Neurological: no TIA or stroke symptoms Dermatological: negative Gastrointestinal: no abdominal pain, change in bowel habits, or black or bloody stools Genito-Urinary: no dysuria, trouble voiding, or hematuria Hematological and Lymphatic: negative Breast: negative Musculoskeletal: negative Remaining ROS negative.   Medications:   I have reviewed the patient's current medications.  Current Outpatient Prescriptions  Medication Sig Dispense Refill  . anastrozole (ARIMIDEX) 1 MG tablet Take 1 mg by mouth daily.      . calcium carbonate 200 MG  capsule Take 250 mg by mouth 2 (two) times daily with a meal.      . cholecalciferol (VITAMIN D) 1000 UNITS tablet Take 1,000 Units by mouth daily.      . Multiple Vitamin (MULTIVITAMIN) capsule Take 1 capsule by mouth daily.          Allergies:  Allergies  Allergen Reactions  . Shrimp (Shellfish Allergy)     Physical Exam: Filed Vitals:   05/21/12 1532  BP: 136/83  Pulse: 90  Temp: 98.2 F (36.8 C)    Body mass index is 31.64 kg/(m^2). Weight: 192 lbs. HEENT:  Sclerae anicteric, conjunctivae pink.  Oropharynx clear.  No mucositis or candidiasis.   Nodes:  No cervical, supraclavicular, or axillary lymphadenopathy palpated.  Breast Exam:  The right chest wall still has some minimal radiation change present, but no skin nodules or masses. Seborrheic keratosis are appreciated. No axillary fullness or lymphadenopathy. The left breast was examined, it is free of masses, skin changes nipple inversion or discharge axilla is clear. Again the skin is involved with seborrheic keratoses. Lungs:  Clear to auscultation bilaterally.  No crackles, rhonchi, or wheezes.   Heart:  Regular rate and rhythm.   Abdomen:  Soft, nontender.  Positive bowel sounds.  No organomegaly or masses palpated.   Musculoskeletal:  No focal spinal tenderness to palpation.  Extremities:  Benign.  No peripheral edema or cyanosis.   Skin:  Benign.   Neuro:  Nonfocal, alert and oriented x 3.   Lab Results: Lab Results  Component Value Date   WBC 4.5 05/21/2012   HGB 13.0 05/21/2012   HCT 39.1 05/21/2012   MCV 90.0 05/21/2012   PLT 248 05/21/2012  NEUTROABS 2.6 05/21/2012     Chemistry      Component Value Date/Time   NA 141 02/25/2012 0805   K 4.3 02/25/2012 0805   CL 103 02/25/2012 0805   CO2 28 02/25/2012 0805   BUN 23 02/25/2012 0805   CREATININE 0.89 02/25/2012 0805      Component Value Date/Time   CALCIUM 10.3 02/25/2012 0805   ALKPHOS 61 02/25/2012 0805   AST 27 02/25/2012 0805   ALT 17 02/25/2012 0805    BILITOT 0.4 02/25/2012 0805      Lab Results  Component Value Date   LABCA2 39 02/25/2012   Radiographic data: 01/24/12 DUAL X-RAY ABSORPTIOMETRY (DXA) FOR BONE MINERAL DENSITY AP LUMBAR SPINE L1-L4  Bone Mineral Density (BMD): 1.097 g/cm2  Young Adult T Score: 0.5 Z Score: 2.7  LEFT FEMUR NECK  Bone Mineral Density (BMD): 0.737 g/cm2  Young Adult T Score: -1.0 Z Score: 0.9  ASSESSMENT: Patient's diagnostic category is NORMAL by WHO Criteria.  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 She has completed 4/4 cycles of q.3 week FEC and 4/4 planned q.3 week doses of adjuvant Taxotere with Neulasta support on day 2. Currently on Arimidex 1mg  per day. 2. Radiation therapy completed on 01/02/2012.    Plan:  The patient is doing well. No clinical evidence of recurrence. I will see her in 6 months time. We discussed removal of her port. She will see her original surgeon in [place] to have this done.. I have given her copies of her bone density test that was done as well as and a mammogram and lab work. At the PIP also discussed her BMI and role of 4 weight loss,  exercise in the prevention of recurrent breast cancer   Natan Hartog 05/21/2012

## 2012-05-21 NOTE — Telephone Encounter (Signed)
gave patient appointment for 12-22-2012 startint at 8:30am printed out calendar and gave to the patient

## 2012-06-11 ENCOUNTER — Telehealth: Payer: Self-pay | Admitting: Oncology

## 2012-06-11 NOTE — Telephone Encounter (Signed)
Pt lmonvm to cx 8/8 flush appt due to port has been removed.

## 2012-12-01 ENCOUNTER — Telehealth: Payer: Self-pay | Admitting: *Deleted

## 2012-12-01 NOTE — Telephone Encounter (Signed)
Left message for pt to return my call so I can reschedule her med onc appt. 

## 2012-12-02 ENCOUNTER — Encounter: Payer: Self-pay | Admitting: Oncology

## 2012-12-02 ENCOUNTER — Telehealth: Payer: Self-pay | Admitting: *Deleted

## 2012-12-02 NOTE — Telephone Encounter (Signed)
Pt returned my call and I confirmed 12/28/12 appt w/ pt.  Mailed letter & calendar to pt.

## 2012-12-22 ENCOUNTER — Other Ambulatory Visit: Payer: Medicare Other | Admitting: Lab

## 2012-12-22 ENCOUNTER — Ambulatory Visit: Payer: Medicare Other | Admitting: Oncology

## 2012-12-28 ENCOUNTER — Ambulatory Visit (HOSPITAL_BASED_OUTPATIENT_CLINIC_OR_DEPARTMENT_OTHER): Payer: Medicare Other | Admitting: Family

## 2012-12-28 ENCOUNTER — Telehealth: Payer: Self-pay | Admitting: Oncology

## 2012-12-28 ENCOUNTER — Encounter: Payer: Self-pay | Admitting: Family

## 2012-12-28 VITALS — BP 146/82 | HR 80 | Temp 98.3°F | Resp 20 | Ht 66.0 in | Wt 201.1 lb

## 2012-12-28 DIAGNOSIS — M8580 Other specified disorders of bone density and structure, unspecified site: Secondary | ICD-10-CM

## 2012-12-28 DIAGNOSIS — E569 Vitamin deficiency, unspecified: Secondary | ICD-10-CM

## 2012-12-28 DIAGNOSIS — M2550 Pain in unspecified joint: Secondary | ICD-10-CM

## 2012-12-28 DIAGNOSIS — C50919 Malignant neoplasm of unspecified site of unspecified female breast: Secondary | ICD-10-CM

## 2012-12-28 DIAGNOSIS — C50911 Malignant neoplasm of unspecified site of right female breast: Secondary | ICD-10-CM

## 2012-12-28 DIAGNOSIS — C773 Secondary and unspecified malignant neoplasm of axilla and upper limb lymph nodes: Secondary | ICD-10-CM

## 2012-12-28 DIAGNOSIS — E559 Vitamin D deficiency, unspecified: Secondary | ICD-10-CM

## 2012-12-28 MED ORDER — EXEMESTANE 25 MG PO TABS: 25.0000 mg | ORAL_TABLET | Freq: Every day | ORAL | Status: DC

## 2012-12-28 NOTE — Progress Notes (Signed)
[facility]  [phone] [phone]  OFFICE PROGRESS NOTE  PATIENT: Brandi Bowen   DOB: 01-13-1941  MR#: [id number]  QMV#:[id number]  MW:[id number] N, MD Brandi Hare, MD Brandi Lerner, MD   DIAGNOSIS:  72 year old [place] woman with stage IIIB right breast cancer diagnosed in 04/2011.  PRIOR THERAPY: 1.  Status post modified radical mastectomy of the right breast with axillary node dissection on 06/619/2012.  2. Status post 4 cycles of adjuvant chemotherapy with FEC from 05/10/2011 through 07/12/2011.  3. Status post 4 cycles of adjuvant chemotherapy with Taxotere from 08/02/2011 through 10/04/2011.  4. Status post radiation therapy (curative) from 11/18/2011 through 01/02/2012.  5. Started Arimidex 1 mg by mouth daily on 01/09/2012.   CURRENT THERAPY:  Arimidex 1 mg by mouth daily.   INTERVAL HISTORY: Dr. Welton Bowen and I saw Brandi Bowen today for follow up of inflammatory invasive ductal carcinoma stage IIIB. The patient was last seen by Dr. Donnie Bowen on 05/21/2012. Since her last office visit, the patient has complaints of bilateral knee pain, right wrist pain, and left index finger that "locks up." The patient also states that she has periods of "warmness" but is reluctant to call it a hot flash. The patient denies any other symptomatology during today's office visit.  The patient is requesting to be switched to Aromasin for antiestrogen therapy.  The patient also asked if she could switch to tomography instead of annual mammograms.  Dr. Welton Bowen explained to her that currently, mammograms are the standard of care.    PAST MEDICAL HISTORY: Past Medical History  Diagnosis Date  . Hydronephrosis   . Cancer     rt breast ca  . Breast cancer   . Hypothyroidism   . Vitamin D deficiency 02/25/2012    PAST SURGICAL HISTORY: Past Surgical History  Procedure Laterality Date  . Mastectomy      FAMILY HISTORY: Family History  Problem  Relation Age of Onset  . Cancer Mother     Colon cancer  . Cancer Brother     Prostate CA    SOCIAL HISTORY: History  Substance Use Topics  . Smoking status: Never Smoker   . Smokeless tobacco: Never Used  . Alcohol Use: No     Comment: Rare glass of wine    ALLERGIES: Allergies  Allergen Reactions  . Shrimp (Shellfish Allergy)      MEDICATIONS:  Current Outpatient Prescriptions  Medication Sig Dispense Refill  . calcium carbonate 200 MG capsule Take 250 mg by mouth 2 (two) times daily with a meal.      . cholecalciferol (VITAMIN D) 1000 UNITS tablet Take 1,000 Units by mouth daily.      . Multiple Vitamin (MULTIVITAMIN) capsule Take 1 capsule by mouth daily.        Marland Kitchen exemestane (AROMASIN) 25 MG tablet Take 1 tablet (25 mg total) by mouth daily after breakfast.  90 tablet  4   No current facility-administered medications for this visit.      REVIEW OF SYSTEMS: A 10 point review of systems was completed and is negative except as noted above.    PHYSICAL EXAMINATION: BP 146/82  Pulse 80  Temp(Src) 98.3 F (36.8 C) (Oral)  Resp 20  Ht 5\' 6"  (1.676 m)  Wt 201 lb 1 oz (91.201 kg)  BMI 32.47 kg/m2   General appearance: Alert, cooperative, well nourished, no apparent distress Head: Normocephalic, without obvious abnormality, atraumatic Eyes: Conjunctivae/corneas clear, PERRLA, EOMI Nose: Nares, septum and mucosa are  normal, no drainage or sinus tenderness Neck: No adenopathy, supple, symmetrical, trachea midline, thyroid not enlarged, no tenderness Resp: Clear to auscultation bilaterally Cardio: Regular rate and rhythm, S1, S2 normal, no murmur, click, rub or gallop Breasts: Right breast surgically absent, well-healed surgical scar, no lymphadenopathy, left breast has a firm inframammary ridge,  no nipple inversion, no axilla fullness, benign breast exam GI: Soft, distended, non-tender, hypoactive bowel sounds, no organomegaly  Skin:  Numerous nevi/seborrheic  keratoses on trunk and cervical areas Extremities: Extremities normal, atraumatic, no cyanosis or edema Lymph nodes: Cervical, supraclavicular, and axillary nodes normal Neurologic: Grossly normal    ECOG FS:  Grade 1 - Symptomatic but completely ambulatory   LAB RESULTS: Lab Results  Component Value Date   WBC 4.5 05/21/2012   NEUTROABS 2.6 05/21/2012   HGB 13.0 05/21/2012   HCT 39.1 05/21/2012   MCV 90.0 05/21/2012   PLT 248 05/21/2012      Chemistry      Component Value Date/Time   NA 139 05/21/2012 1513   K 4.0 05/21/2012 1513   CL 100 05/21/2012 1513   CO2 27 05/21/2012 1513   BUN 13 05/21/2012 1513   CREATININE 0.75 05/21/2012 1513      Component Value Date/Time   CALCIUM 10.5 05/21/2012 1513   ALKPHOS 79 05/21/2012 1513   AST 31 05/21/2012 1513   ALT 25 05/21/2012 1513   BILITOT 0.3 05/21/2012 1513       Lab Results  Component Value Date   LABCA2 30 05/21/2012     RADIOGRAPHIC STUDIES: No results found.  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 She has been on Arimidex since 01/09/2012.  PLAN: 1. The patient is being switched to Aromasin 25 mg by mouth daily starting on 12/29/2012. Dr. Welton Bowen discussed with her that treatment may continue beyond the typical 5 year anti-estrogen therapy.  2. The patient was asked to try over-the-counter Peridin-C for hot flashes.  3. Dr. Welton Bowen suggested that the patient should try remain physically active and consider doing water aerobics to lessen her joint pains.  4.  The patient's is due for another mammogram in 04/2013.  5. We plan to see [name] again in 6 months at which time we will check a CMP, CBC and vitamin D level.  All questions were answered.  The patient was encouraged to contact us in the interim with any problems, questions or concerns.    Larina Bras, NP-C 12/28/2012, 5:41 PM

## 2012-12-28 NOTE — Patient Instructions (Addendum)
Please contact us at [phone] if you have any questions or concerns.  Perdin-C is over the counter for hot flashes.

## 2012-12-28 NOTE — Telephone Encounter (Signed)
, °

## 2013-01-21 ENCOUNTER — Telehealth: Payer: Self-pay | Admitting: *Deleted

## 2013-01-21 ENCOUNTER — Telehealth: Payer: Self-pay | Admitting: Oncology

## 2013-01-21 NOTE — Telephone Encounter (Signed)
Received a message that patient had questions concerning her medications.  Called and spoke with patient who states when she was here on 12/28/12 and saw Norina Buzzard and Dr. Welton Flakes she requested to stop Arimidex and start Aromasin due to a lot of pain in her joints.   Patient immediately started Aromasin without stopping the Arimidex for a couple of weeks and is wandering when her pain will get better.  Explained to her that she needed to stop the Arimidex for 2-3 weeks before starting the Aromasin to get the Arimidex out of her system.  Instructed her to stop Aromasin  For 2- 3 weeks and get everything out of her system and then start Aromasin and then call us back and let us know how she is doing. Patient verbalized understanding.

## 2013-01-21 NOTE — Telephone Encounter (Signed)
Received email from Skip to follow up with pt regarding changing her provider from [facility] to [facility]. Pt also had some concerns re meds and pain which were given to Sherrell Puller to address and contact pt. S/w pt today re changing provider to [facility] and new d/t for 9/2 @ 9am. S/w Kesha and gv her pt's work number. Dellia Cloud will contact pt re meds/pain. Pt aware.

## 2013-02-12 ENCOUNTER — Other Ambulatory Visit: Payer: Self-pay | Admitting: *Deleted

## 2013-02-12 DIAGNOSIS — C50919 Malignant neoplasm of unspecified site of unspecified female breast: Secondary | ICD-10-CM

## 2013-02-12 MED ORDER — LETROZOLE 2.5 MG PO TABS: 2.5000 mg | ORAL_TABLET | Freq: Every day | ORAL | Status: DC

## 2013-02-12 NOTE — Telephone Encounter (Signed)
Received call from patient stating she has been off the Aromasin for about 3 weeks and really doesn't feel any different.   She does not have prescription drug plan and has to pay out of pocket and really cannot afford the Aromasin.  This RN suggested letrozole to try.  She can get this for $11.40 ( a 90 day supply) at our [facility].  RX called in to the pharmacy.  She will let us know how this works for her.

## 2013-06-30 ENCOUNTER — Telehealth: Payer: Self-pay | Admitting: *Deleted

## 2013-07-05 ENCOUNTER — Other Ambulatory Visit: Payer: Medicare Other | Admitting: Lab

## 2013-07-05 ENCOUNTER — Ambulatory Visit: Payer: Medicare Other | Admitting: Oncology

## 2013-07-06 ENCOUNTER — Ambulatory Visit (HOSPITAL_BASED_OUTPATIENT_CLINIC_OR_DEPARTMENT_OTHER): Payer: Medicare Other | Admitting: Oncology

## 2013-07-06 ENCOUNTER — Other Ambulatory Visit (HOSPITAL_BASED_OUTPATIENT_CLINIC_OR_DEPARTMENT_OTHER): Payer: Medicare Other

## 2013-07-06 VITALS — BP 150/89 | HR 82 | Temp 98.5°F | Resp 18 | Ht 66.0 in | Wt 195.6 lb

## 2013-07-06 DIAGNOSIS — E559 Vitamin D deficiency, unspecified: Secondary | ICD-10-CM

## 2013-07-06 DIAGNOSIS — C50911 Malignant neoplasm of unspecified site of right female breast: Secondary | ICD-10-CM

## 2013-07-06 DIAGNOSIS — C50919 Malignant neoplasm of unspecified site of unspecified female breast: Secondary | ICD-10-CM

## 2013-07-06 DIAGNOSIS — Z170 Estrogen receptor positive status [ER+]: Secondary | ICD-10-CM

## 2013-07-06 DIAGNOSIS — E569 Vitamin deficiency, unspecified: Secondary | ICD-10-CM

## 2013-07-06 LAB — COMPREHENSIVE METABOLIC PANEL (CC13)
ALT: 25 U/L (ref 0–55)
AST: 25 U/L (ref 5–34)
Albumin: 3.8 g/dL (ref 3.5–5.0)
BUN: 18.3 mg/dL (ref 7.0–26.0)
Calcium: 9.5 mg/dL (ref 8.4–10.4)
Chloride: 108 mEq/L (ref 98–109)
Potassium: 4.3 mEq/L (ref 3.5–5.1)
Sodium: 143 mEq/L (ref 136–145)
Total Protein: 6.8 g/dL (ref 6.4–8.3)

## 2013-07-06 LAB — CBC WITH DIFFERENTIAL/PLATELET
Basophils Absolute: 0.1 10*3/uL (ref 0.0–0.1)
EOS%: 3.8 % (ref 0.0–7.0)
HGB: 12.8 g/dL (ref 11.6–15.9)
MCH: 29.7 pg (ref 25.1–34.0)
NEUT#: 3 10*3/uL (ref 1.5–6.5)
RDW: 13.5 % (ref 11.2–14.5)
lymph#: 1.5 10*3/uL (ref 0.9–3.3)

## 2013-07-06 MED ORDER — LETROZOLE 2.5 MG PO TABS: 2.5000 mg | ORAL_TABLET | Freq: Every day | ORAL | Status: DC

## 2013-07-06 NOTE — Progress Notes (Signed)
Patient ID: Brandi Bowen, female   DOB: Apr 02, 1941, 72 y.o.   MRN: [id number] ID: Brandi Bowen OB: Feb 16, 1941  MR#: [id number]  CSN#:[id number]  PCP: Luna Kitchens, MD GYN:   SU:  OTHER MD: Hal Neer   HISTORY OF PRESENT ILLNESS: From Dr. Doreatha Massed intake note dated 04/17/2011:  "This is a pleasant 72 year old woman from [facility] referred by Dr. Claudine Mouton for evaluation and treatment of breast cancer.    [name] contracted a mass on the right breast approximately 1-1/2 years ago.  She had previously had cysts on her breast and had never actually had a mammogram.  She ultimately was referred to Dr. Claudine Mouton for evaluation.  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  The tissue was reported as ER/PR positive, HER2 negative.  The patient elected to have no imaging and go immediately to mastectomy which was performed on 03/27/2011.  Final pathology showed a tumor greater than 7 cm.  Margins were essentially negative.  There was nipple invasion present with ulceration as well as multifocal skin involvement with nests of tumor in dermal lymphatics.  This was essentially DCIS.  An axillary node dissection showed a total involvement of 5 out of 6 lymph nodes, tumor involving the nipple adjacent to the mass measuring 7 cm contiguous nodule, 1.6 cm microscopic areas of tumor growth.   The patient had a staging CT scan ordered by a medical oncologist in [facility].  A bone scan showed multiple areas of activity, thought to be related to degenerative disease, large fibroid with resultant right hydronephrosis seen.  A CT scan of the chest, abdomen, and pelvis performed on 04/11/2011 showed small thyroid nodules.  No obvious lung nodules.  The  liver was free of any obvious focal lesions.  In the pelvis, there was an enlarged, lobulated, heterogeneous type uterus was  seen, measuring 15 x 16 cm.  There were multiple fibroid tumors present with the largest appearing to cause right hydroureter and necrosis.    It was recommended to her that she undergo adjuvant chemotherapy and radiation.  Of note is that at baseline, her CA27 and CA29  have been at 69.7 on 04/08/2011.  Surgery had been performed the week before."  Her subsequent history is as detailed below  INTERVAL HISTORY: Brandi Bowen returns today for followup of her breast cancer. She is establishing herself in my service today. -- after completing her adjuvant chemotherapy and radiation she was started on anastrozole, which she tolerated poorly. She was then switched to exemestane, which she tolerated well except a cluster 225 pounds for one dose (at our [facility] outpatient pharmacy). She was then started on letrozole, which was only $15 for 90 tablets, and which she is tolerating well.  REVIEW OF SYSTEMS: She is taking letrozole with no significant side effects and particularly no complaints regarding hot flashes, vaginal dryness, or arthralgias/myalgias. She does have some joint pain chiefly involving the knees. This is worse at rest, and improved with activity. Otherwise a detailed review of systems today was noncontributory  PAST MEDICAL HISTORY: Past Medical History  Diagnosis Date  . Hydronephrosis   . Cancer     rt breast ca  . Breast cancer   . Hypothyroidism   . Vitamin D deficiency 02/25/2012    PAST SURGICAL HISTORY: Past Surgical History  Procedure Laterality Date  . Mastectomy      FAMILY  HISTORY Family History  Problem Relation Age of Onset  . Cancer Mother     Colon cancer  . Cancer Brother     Prostate CA  the patient's mother died with colon cancer at the age of 71. This was diagnosed a few months before her death. The patient's father lived to be 85. Mitsy had 2 brothers and one sister. One brother was diagnosed with prostate cancer at the age of 36.  GYNECOLOGIC HISTORY:   Menarche age 38, change of life in her early 29s. She is GX P0.  SOCIAL HISTORY:  She currently works at a Environmental health practitioner as Production designer, theatre/television/film. Her husband Franco Nones") is a Counsellor. Sons Trey Paula and [place], both of whom live in [place], are both in the swimming pool business. The patient has 3 biological grandchildren and several step grandchildren. She attends [employer]    ADVANCED DIRECTIVES:    HEALTH MAINTENANCE: History  Substance Use Topics  . Smoking status: Never Smoker   . Smokeless tobacco: Never Used  . Alcohol Use: No     Comment: Rare glass of wine     Colonoscopy:  PAP:  Bone density:  Lipid panel:  Allergies  Allergen Reactions  . Shrimp [Shellfish Allergy]     Current Outpatient Prescriptions  Medication Sig Dispense Refill  . calcium carbonate 200 MG capsule Take 250 mg by mouth 2 (two) times daily with a meal.      . cholecalciferol (VITAMIN D) 1000 UNITS tablet Take 1,000 Units by mouth daily.      Marland Kitchen letrozole (FEMARA) 2.5 MG tablet Take 1 tablet (2.5 mg total) by mouth daily.  90 tablet  3  . Multiple Vitamin (MULTIVITAMIN) capsule Take 1 capsule by mouth daily.         No current facility-administered medications for this visit.    OBJECTIVE: older white woman in no acute distress Filed Vitals:   07/06/13 0845  BP: 150/89  Pulse: 82  Temp: 98.5 F (36.9 C)  Resp: 18     Body mass index is 31.59 kg/(m^2).    ECOG FS:0 - Asymptomatic  Sclerae unicteric Oropharynx clear No cervical or supraclavicular adenopathy Lungs no rales or rhonchi Heart regular rate and rhythm Abd benign MSK no focal spinal tenderness, no peripheral edema Neuro: non-focal, well-oriented, appropriate affect Breasts: the right breast is status post mastectomy. There is no evidence of local recurrence. The right axilla is benign. The left breast is unremarkable.   LAB RESULTS:  CMP     Component Value Date/Time   NA 139 05/21/2012 1513   K 4.0  05/21/2012 1513   CL 100 05/21/2012 1513   CO2 27 05/21/2012 1513   GLUCOSE 148* 05/21/2012 1513   BUN 13 05/21/2012 1513   CREATININE 0.75 05/21/2012 1513   CALCIUM 10.5 05/21/2012 1513   PROT 7.1 05/21/2012 1513   ALBUMIN 4.0 05/21/2012 1513   AST 31 05/21/2012 1513   ALT 25 05/21/2012 1513   ALKPHOS 79 05/21/2012 1513   BILITOT 0.3 05/21/2012 1513    I No results found for this basename: SPEP, UPEP,  kappa and lambda light chains    Lab Results  Component Value Date   WBC 5.3 07/06/2013   NEUTROABS 3.0 07/06/2013   HGB 12.8 07/06/2013   HCT 37.5 07/06/2013   MCV 86.8 07/06/2013   PLT 236 07/06/2013      Chemistry      Component Value Date/Time   NA 139 05/21/2012  1513   K 4.0 05/21/2012 1513   CL 100 05/21/2012 1513   CO2 27 05/21/2012 1513   BUN 13 05/21/2012 1513   CREATININE 0.75 05/21/2012 1513      Component Value Date/Time   CALCIUM 10.5 05/21/2012 1513   ALKPHOS 79 05/21/2012 1513   AST 31 05/21/2012 1513   ALT 25 05/21/2012 1513   BILITOT 0.3 05/21/2012 1513       Lab Results  Component Value Date   LABCA2 30 05/21/2012    No components found with this basename: LABCA125    No results found for this basename: INR,  in the last 168 hours  Urinalysis No results found for this basename: colorurine, appearanceur, labspec, phurine, glucoseu, hgbur, bilirubinur, ketonesur, proteinur, urobilinogen, nitrite, leukocytesur    STUDIES: No results found.  ASSESSMENT: 72 y.o.  1. Status post right modified radical mastectomy on 06/619/2012 fon an mpT4b pN1, stage IIIB invasive ductal carcinoma, grade 3,. Estrogen receptor positive at greater than 95%, progesterone receptor positive at 70%, with an MIB-1 of 15% and no HER-2 amplification by FISH. 2. Status post 4 cycles of adjuvant chemotherapy with fluorouracil, epirubicin and cyclophosphamide from 05/10/2011 through 07/12/2011.  3. Status post 4 cycles of adjuvant chemotherapy with docetaxel from 08/02/2011 through 10/04/2011.  4.  Status post adjuvant radiation therapy from 11/18/2011 through 01/02/2012.  5. Started Arimidex 1 mg by mouth daily on 01/09/2012, later switched to exemestane, discontinued because of cost, started on letrozole February 2014. 6. DEXA scan April 2013 normal  PLAN: I encouraged Nydia to continue on her anti-HER-2 treatment, despite the trouble she has had finding the right drug. We will do a minimum of 5 years of letrozole, possibly longer, depending on the results of studies which are being awaited. She is going to see [place] again in 6 months. She knows to call for any problems that may develop before her next visit here. Lowella Dell, MD   07/06/2013 9:06 AM

## 2013-07-11 ENCOUNTER — Telehealth: Payer: Self-pay | Admitting: Oncology

## 2013-07-11 NOTE — Telephone Encounter (Signed)
lmonvm for pt re appts for 01/12/14. Schedule mailed.

## 2013-07-28 ENCOUNTER — Other Ambulatory Visit: Payer: Self-pay

## 2013-07-28 DIAGNOSIS — Z1231 Encounter for screening mammogram for malignant neoplasm of breast: Secondary | ICD-10-CM

## 2013-08-16 ENCOUNTER — Ambulatory Visit: Payer: Medicare Other

## 2013-10-27 ENCOUNTER — Other Ambulatory Visit: Payer: Self-pay

## 2013-10-27 ENCOUNTER — Ambulatory Visit
Admission: RE | Admit: 2013-10-27 | Discharge: 2013-10-27 | Disposition: A | Discharge status: Home / Self Care | Payer: Medicare Other | Source: Ambulatory Visit

## 2013-10-27 DIAGNOSIS — Z1231 Encounter for screening mammogram for malignant neoplasm of breast: Secondary | ICD-10-CM

## 2014-01-12 ENCOUNTER — Other Ambulatory Visit (HOSPITAL_BASED_OUTPATIENT_CLINIC_OR_DEPARTMENT_OTHER): Payer: Medicare Other

## 2014-01-12 ENCOUNTER — Telehealth: Payer: Self-pay | Admitting: *Deleted

## 2014-01-12 ENCOUNTER — Ambulatory Visit (HOSPITAL_BASED_OUTPATIENT_CLINIC_OR_DEPARTMENT_OTHER): Payer: Medicare Other | Admitting: Physician Assistant

## 2014-01-12 ENCOUNTER — Encounter: Payer: Self-pay | Admitting: Physician Assistant

## 2014-01-12 VITALS — BP 115/76 | HR 89 | Temp 98.1°F | Resp 20 | Ht 66.0 in | Wt 193.3 lb

## 2014-01-12 DIAGNOSIS — Z1231 Encounter for screening mammogram for malignant neoplasm of breast: Secondary | ICD-10-CM

## 2014-01-12 DIAGNOSIS — Z170 Estrogen receptor positive status [ER+]: Secondary | ICD-10-CM

## 2014-01-12 DIAGNOSIS — N950 Postmenopausal bleeding: Secondary | ICD-10-CM | POA: Insufficient documentation

## 2014-01-12 DIAGNOSIS — Z853 Personal history of malignant neoplasm of breast: Secondary | ICD-10-CM

## 2014-01-12 DIAGNOSIS — C50919 Malignant neoplasm of unspecified site of unspecified female breast: Secondary | ICD-10-CM

## 2014-01-12 DIAGNOSIS — Z780 Asymptomatic menopausal state: Secondary | ICD-10-CM

## 2014-01-12 LAB — COMPREHENSIVE METABOLIC PANEL (CC13)
ALT: 26 U/L (ref 0–55)
ANION GAP: 12 meq/L — AB (ref 3–11)
AST: 28 U/L (ref 5–34)
Albumin: 4.1 g/dL (ref 3.5–5.0)
Alkaline Phosphatase: 77 U/L (ref 40–150)
BILIRUBIN TOTAL: 0.31 mg/dL (ref 0.20–1.20)
BUN: 19.4 mg/dL (ref 7.0–26.0)
CO2: 25 meq/L (ref 22–29)
Calcium: 9.8 mg/dL (ref 8.4–10.4)
Chloride: 107 mEq/L (ref 98–109)
Creatinine: 0.9 mg/dL (ref 0.6–1.1)
Glucose: 124 mg/dl (ref 70–140)
Potassium: 4 mEq/L (ref 3.5–5.1)
SODIUM: 144 meq/L (ref 136–145)
TOTAL PROTEIN: 7 g/dL (ref 6.4–8.3)

## 2014-01-12 LAB — CBC WITH DIFFERENTIAL/PLATELET
BASO%: 1 % (ref 0.0–2.0)
Basophils Absolute: 0 10*3/uL (ref 0.0–0.1)
EOS%: 4.9 % (ref 0.0–7.0)
Eosinophils Absolute: 0.3 10*3/uL (ref 0.0–0.5)
HCT: 40.4 % (ref 34.8–46.6)
HGB: 13.5 g/dL (ref 11.6–15.9)
LYMPH%: 25.1 % (ref 14.0–49.7)
MCH: 29.1 pg (ref 25.1–34.0)
MCHC: 33.3 g/dL (ref 31.5–36.0)
MCV: 87.4 fL (ref 79.5–101.0)
MONO#: 0.4 10*3/uL (ref 0.1–0.9)
MONO%: 8.6 % (ref 0.0–14.0)
NEUT#: 3.1 10*3/uL (ref 1.5–6.5)
NEUT%: 60.4 % (ref 38.4–76.8)
PLATELETS: 247 10*3/uL (ref 145–400)
RBC: 4.63 10*6/uL (ref 3.70–5.45)
RDW: 14.1 % (ref 11.2–14.5)
WBC: 5.2 10*3/uL (ref 3.9–10.3)
lymph#: 1.3 10*3/uL (ref 0.9–3.3)

## 2014-01-12 MED ORDER — LETROZOLE 2.5 MG PO TABS
2.5000 mg | ORAL_TABLET | Freq: Every day | ORAL | Status: DC
Start: 2014-01-12 — End: 2015-01-09

## 2014-01-12 NOTE — Telephone Encounter (Signed)
appts made and printed...td 

## 2014-01-12 NOTE — Progress Notes (Signed)
Patient ID: Brandi Bowen, female   DOB: [birth year], 73 y.o.   MRN: [id number] ID: Brandi Bowen OB: [birth year]  MR#: [id number]  CSN#:[id number]  PCP: Estill Cotta, MD GYN:   SU:  OTHER MD: Thea Silversmith, MD  CHIEF COMPLAINT:  Hx of Right Breast Cancer    HISTORY OF PRESENT ILLNESS: From Dr. Rada Hay intake note dated 04/17/2011:  "This is a pleasant 73 year old woman from [facility] referred by Dr. Christian Mate for evaluation and treatment of breast cancer.    Ms. Rouillard contracted a mass on the right breast approximately 1-1/2 years ago.  She had previously had cysts on her breast and had never actually had a mammogram.  She ultimately was referred to Dr. Christian Mate for evaluation.  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  The tissue was reported as ER/PR positive, HER2 negative.  The patient elected to have no imaging and go immediately to mastectomy which was performed on 03/27/2011.  Final pathology showed a tumor greater than 7 cm.  Margins were essentially negative.  There was nipple invasion present with ulceration as well as multifocal skin involvement with nests of tumor in dermal lymphatics.  This was essentially DCIS.  An axillary node dissection showed a total involvement of 5 out of 6 lymph nodes, tumor involving the nipple adjacent to the mass measuring 7 cm contiguous nodule, 1.6 cm microscopic areas of tumor growth.   The patient had a staging CT scan ordered by a medical oncologist in [facility].  A bone scan showed multiple areas of activity, thought to be related to degenerative disease, large fibroid with resultant right hydronephrosis seen.  A CT scan of the chest, abdomen, and pelvis performed on 04/11/2011 showed small thyroid nodules.  No obvious lung nodules.  The  liver was free of any obvious focal lesions.  In the pelvis, there was  an enlarged, lobulated, heterogeneous type uterus was seen, measuring 15 x 16 cm.  There were multiple fibroid tumors present with the largest appearing to cause right hydroureter and necrosis.    It was recommended to her that she undergo adjuvant chemotherapy and radiation.  Of note is that at baseline, her CA27 and CA29  have been at 69.7 on 04/08/2011.  Surgery had been performed the week before."  Her subsequent history is as detailed below  INTERVAL HISTORY: Tenise returns alone  today for routine six-month followup of her right breast carcinoma. Interval history is generally unremarkable. 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 Her husband has had some health issues over the past year, and primarily she is the caregiver for him.   Brandi Bowen continues on letrozole with good tolerance. She's had no problems at all with hot flashes. She denies any vaginal dryness, and has had no increased joint pain. In fact overall, she had no new complaints today.   REVIEW OF SYSTEMS: Alencia denies any recent illnesses and has had no fevers, chills, or night sweats. She denies any rashes, skin changes, abnormal bruising, or abnormal bleeding. Specifically, she denies any vaginal bleeding. Her energy level is fairly good. Her appetite is good, and she denies any nausea or change in bowel or bladder habits. She's had no increased cough, shortness of breath, orthopnea, chest pain, or palpitations. She also denies any abnormal headaches or dizziness, and has had no unusual myalgias, arthralgias, or bony  pain. She denies any peripheral swelling.  A detailed review of systems is otherwise stable and noncontributory.    PAST MEDICAL HISTORY: Past Medical History  Diagnosis Date  . Hydronephrosis   . Cancer     rt breast ca  . Breast cancer   . Hypothyroidism   . Vitamin D deficiency 02/25/2012    PAST SURGICAL HISTORY: Past Surgical History  Procedure Laterality Date  .  Mastectomy      FAMILY HISTORY Family History  Problem Relation Age of Onset  . Cancer Mother     Colon cancer  . Cancer Brother     Prostate CA  the patient's mother died with colon cancer at the age of 58. This was diagnosed a few months before her death. The patient's father lived to be 34. Ane had 2 brothers and one sister. One brother was diagnosed with prostate cancer at the age of 14.  GYNECOLOGIC HISTORY:  Menarche age 51, change of life in her early 87s. She is GX P0.  SOCIAL HISTORY:   (Updated 01/12/2014) She currently works part-time at a Medical sales representative as Freight forwarder. Her husband Federico Flake") is a Airline pilot. Sons [name] and [place], both of whom live in [place], are both in the swimming pool business. The patient has 3 biological grandchildren and several step grandchildren. She attends [employer]    ADVANCED DIRECTIVES:    HEALTH MAINTENANCE: (Updated 01/12/2014) History  Substance Use Topics  . Smoking status: Never Smoker   . Smokeless tobacco: Never Used  . Alcohol Use: No     Comment: Rare glass of wine     Colonoscopy: Not on file  PAP: Not on file  Bone density:  01/22/2012, normal  Lipid panel: Not on file/ Dr. Rayford Halsted   Allergies  Allergen Reactions  . Shrimp [Shellfish Allergy]     Current Outpatient Prescriptions  Medication Sig Dispense Refill  . Cholecalciferol ([employer] VIT D 5000 HIGH-POTENCY PO) Take by mouth. Kal Vit D. Take 5000 U daily      . letrozole (FEMARA) 2.5 MG tablet Take 1 tablet (2.5 mg total) by mouth daily.  90 tablet  3  . Multiple Vitamin (MULTIVITAMIN) capsule Take 1 capsule by mouth daily.        Marland Kitchen Specialty Vitamins Products (ONE-A-DAY BONE STRENGTH PO) Take 1 tablet by mouth 3 (three) times daily.      Marland Kitchen UNABLE TO FIND Milk thistle Tumeric       No current facility-administered medications for this visit.    OBJECTIVE: older white woman who appears well in no acute distress Filed Vitals:    01/12/14 0845  BP: 115/76  Pulse: 89  Temp: 98.1 F (36.7 C)  Resp: 20     Body mass index is 31.21 kg/(m^2).    ECOG FS:0 - Asymptomatic Filed Weights   01/12/14 0845  Weight: 193 lb 4.8 oz (87.68 kg)   Physical Exam: HEENT:  Sclerae anicteric.  Oropharynx clear and moist. Neck supple, trachea midline. No thyromegaly. NODES:  No cervical or supraclavicular lymphadenopathy palpated.  BREAST EXAM:  Patient is status post right mastectomy. There no such as nodularities or skin changes. Left breast is unremarkable. Axillae are benign bilaterally with palpable lymphadenopathy. LUNGS:  Clear to auscultation bilaterally.  No wheezes or rhonchi HEART:  Regular rate and rhythm. No murmur appreciated. ABDOMEN:  Soft, nontender.  Positive bowel sounds.  MSK:  No focal spinal tenderness to palpation. Good range of motion bilaterally in  the upper extremities. EXTREMITIES:  No peripheral edema.  No lymphedema in the right upper extremity. SKIN:  Benign with no new rashes or skin changes. No excessive ecchymoses. No petechiae. No pallor. NEURO:  Nonfocal. Well oriented.   positive affect.     LAB RESULTS:   Lab Results  Component Value Date   WBC 5.2 01/12/2014   NEUTROABS 3.1 01/12/2014   HGB 13.5 01/12/2014   HCT 40.4 01/12/2014   MCV 87.4 01/12/2014   PLT 247 01/12/2014      Chemistry      Component Value Date/Time   NA 144 01/12/2014 0828   NA 139 05/21/2012 1513   K 4.0 01/12/2014 0828   K 4.0 05/21/2012 1513   CL 100 05/21/2012 1513   CO2 25 01/12/2014 0828   CO2 27 05/21/2012 1513   BUN 19.4 01/12/2014 0828   BUN 13 05/21/2012 1513   CREATININE 0.9 01/12/2014 0828   CREATININE 0.75 05/21/2012 1513      Component Value Date/Time   CALCIUM 9.8 01/12/2014 0828   CALCIUM 10.5 05/21/2012 1513   ALKPHOS 77 01/12/2014 0828   ALKPHOS 79 05/21/2012 1513   AST 28 01/12/2014 0828   AST 31 05/21/2012 1513   ALT 26 01/12/2014 0828   ALT 25 05/21/2012 1513   BILITOT 0.31 01/12/2014 0828   BILITOT  0.3 05/21/2012 1513       STUDIES:  Most recent bone density was on 01/22/2012 and was normal.  Most recent left screening mammogram on 10/29/2013 was unremarkable.   ASSESSMENT: 73 y.o.  1. Status post right modified radical mastectomy on 06/619/2012 fon an mpT4b pN1, stage IIIB invasive ductal carcinoma, grade 3,. Estrogen receptor positive at greater than 95%, progesterone receptor positive at 70%, with an MIB-1 of 15% and no HER-2 amplification by FISH. 2. Status post 4 cycles of adjuvant chemotherapy with fluorouracil, epirubicin and cyclophosphamide from 05/10/2011 through 07/12/2011.  3. Status post 4 cycles of adjuvant chemotherapy with docetaxel from 08/02/2011 through 10/04/2011.  4. Status post adjuvant radiation therapy from 11/18/2011 through 01/02/2012.  5. Started Arimidex 1 mg by mouth daily on 01/09/2012, later switched to exemestane, discontinued because of cost, started on letrozole February 2014.  The plan is to continue antiestrogen therapy for total of 5 years (until March 2018). 6. DEXA scan April 2013 normal  PLAN: Shawnell continues to tolerate the letrozole extremely well, and I'm making no changes in her current regimen. This is been refilled for another year. She is due for a bone density, and this will be scheduled at their next available appointment. She'll continue to see [place] every 6 months, and we'll see Dr. Jana Hakim for labs and physical exam in September. At that point, if she is doing well, we will likely go to annual visits.   Enid Maultsby, PA-C   01/12/2014 9:37 AM

## 2014-01-31 ENCOUNTER — Other Ambulatory Visit: Payer: Medicare Other

## 2014-02-03 ENCOUNTER — Ambulatory Visit
Admission: RE | Admit: 2014-02-03 | Discharge: 2014-02-03 | Disposition: A | Discharge status: Home / Self Care | Payer: Medicare Other | Source: Ambulatory Visit | Attending: Physician Assistant | Admitting: Physician Assistant

## 2014-02-03 DIAGNOSIS — Z780 Asymptomatic menopausal state: Secondary | ICD-10-CM

## 2014-02-03 DIAGNOSIS — Z853 Personal history of malignant neoplasm of breast: Secondary | ICD-10-CM

## 2014-07-18 ENCOUNTER — Other Ambulatory Visit: Payer: Self-pay | Admitting: *Deleted

## 2014-07-18 ENCOUNTER — Other Ambulatory Visit (HOSPITAL_BASED_OUTPATIENT_CLINIC_OR_DEPARTMENT_OTHER): Payer: Medicare Other

## 2014-07-18 ENCOUNTER — Ambulatory Visit (HOSPITAL_BASED_OUTPATIENT_CLINIC_OR_DEPARTMENT_OTHER): Payer: Medicare Other | Admitting: Oncology

## 2014-07-18 ENCOUNTER — Telehealth: Payer: Self-pay | Admitting: Oncology

## 2014-07-18 VITALS — BP 145/93 | HR 82 | Temp 99.0°F | Resp 20 | Ht 66.0 in | Wt 195.9 lb

## 2014-07-18 DIAGNOSIS — Z780 Asymptomatic menopausal state: Secondary | ICD-10-CM

## 2014-07-18 DIAGNOSIS — Z853 Personal history of malignant neoplasm of breast: Secondary | ICD-10-CM

## 2014-07-18 DIAGNOSIS — C50919 Malignant neoplasm of unspecified site of unspecified female breast: Secondary | ICD-10-CM

## 2014-07-18 DIAGNOSIS — E559 Vitamin D deficiency, unspecified: Secondary | ICD-10-CM

## 2014-07-18 DIAGNOSIS — C50911 Malignant neoplasm of unspecified site of right female breast: Secondary | ICD-10-CM

## 2014-07-18 DIAGNOSIS — Z170 Estrogen receptor positive status [ER+]: Secondary | ICD-10-CM

## 2014-07-18 LAB — CBC WITH DIFFERENTIAL/PLATELET
BASO%: 0.8 % (ref 0.0–2.0)
BASOS ABS: 0 10*3/uL (ref 0.0–0.1)
EOS%: 4 % (ref 0.0–7.0)
Eosinophils Absolute: 0.2 10*3/uL (ref 0.0–0.5)
HEMATOCRIT: 39.5 % (ref 34.8–46.6)
HEMOGLOBIN: 13 g/dL (ref 11.6–15.9)
LYMPH%: 27.3 % (ref 14.0–49.7)
MCH: 29 pg (ref 25.1–34.0)
MCHC: 32.9 g/dL (ref 31.5–36.0)
MCV: 88.2 fL (ref 79.5–101.0)
MONO#: 0.4 10*3/uL (ref 0.1–0.9)
MONO%: 7.3 % (ref 0.0–14.0)
NEUT%: 60.6 % (ref 38.4–76.8)
NEUTROS ABS: 3.3 10*3/uL (ref 1.5–6.5)
Platelets: 238 10*3/uL (ref 145–400)
RBC: 4.48 10*6/uL (ref 3.70–5.45)
RDW: 13.5 % (ref 11.2–14.5)
WBC: 5.4 10*3/uL (ref 3.9–10.3)
lymph#: 1.5 10*3/uL (ref 0.9–3.3)

## 2014-07-18 LAB — COMPREHENSIVE METABOLIC PANEL (CC13)
ALT: 17 U/L (ref 0–55)
AST: 26 U/L (ref 5–34)
Albumin: 3.8 g/dL (ref 3.5–5.0)
Alkaline Phosphatase: 58 U/L (ref 40–150)
Anion Gap: 12 mEq/L — ABNORMAL HIGH (ref 3–11)
BILIRUBIN TOTAL: 0.24 mg/dL (ref 0.20–1.20)
BUN: 17.7 mg/dL (ref 7.0–26.0)
CALCIUM: 10 mg/dL (ref 8.4–10.4)
CO2: 24 meq/L (ref 22–29)
CREATININE: 0.8 mg/dL (ref 0.6–1.1)
Chloride: 107 mEq/L (ref 98–109)
GLUCOSE: 104 mg/dL (ref 70–140)
Potassium: 4.5 mEq/L (ref 3.5–5.1)
Sodium: 143 mEq/L (ref 136–145)
Total Protein: 6.9 g/dL (ref 6.4–8.3)

## 2014-07-18 MED ORDER — MULTIVITAMINS PO CAPS: 1.0000 | ORAL_CAPSULE | Freq: Every day | ORAL | Status: DC

## 2014-07-18 MED ORDER — CHOLECALCIFEROL 125 MCG (5000 UT) PO CAPS: 5000.0000 [IU] | ORAL_CAPSULE | Freq: Every day | ORAL | Status: DC

## 2014-07-18 NOTE — Progress Notes (Signed)
Patient ID: [name], female   DOB: 08-20-1941, 73 y.o.   MRN: [id number] ID: Brandi Bowen OB: 05-May-1941  MR#: [id number]  CSN#:[id number]  PCP: no PCP GYN:   SU:  OTHER MD: Brandi Silversmith, MD  CHIEF COMPLAINT:  Hx of Right Breast Cancer    HISTORY OF PRESENT ILLNESS: From Brandi Bowen intake note dated 04/17/2011:  "This is a pleasant 73 year old woman from [facility] referred by Brandi Bowen for evaluation and treatment of breast cancer.    Ms. Dau contracted a mass on the right breast approximately 1-1/2 years ago.  She had previously had cysts on her breast and had never actually had a mammogram.  She ultimately was referred to Brandi Bowen for evaluation.  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  The tissue was reported as ER/PR positive, HER2 negative.  The patient elected to have no imaging and go immediately to mastectomy which was performed on 03/27/2011.  Final pathology showed a tumor greater than 7 cm.  Margins were essentially negative.  There was nipple invasion present with ulceration as well as multifocal skin involvement with nests of tumor in dermal lymphatics.  This was essentially DCIS.  An axillary node dissection showed a total involvement of 5 out of 6 lymph nodes, tumor involving the nipple adjacent to the mass measuring 7 cm contiguous nodule, 1.6 cm microscopic areas of tumor growth.   The patient had a staging CT scan ordered by a medical oncologist in [facility].  A bone scan showed multiple areas of activity, thought to be related to degenerative disease, large fibroid with resultant right hydronephrosis seen.  A CT scan of the chest, abdomen, and pelvis performed on 04/11/2011 showed small thyroid nodules.  No obvious lung nodules.  The  liver was free of any obvious focal lesions.  In the pelvis, there was an  enlarged, lobulated, heterogeneous type uterus was seen, measuring 15 x 16 cm.  There were multiple fibroid tumors present with the largest appearing to cause right hydroureter and necrosis.    It was recommended to her that she undergo adjuvant chemotherapy and radiation.  Of note is that at baseline, her CA27 and CA29  have been at 69.7 on 04/08/2011.  Surgery had been performed the week before."  Her subsequent history is as detailed below  INTERVAL HISTORY: Brandi Bowen returns today for followup of her breast cancer. Interval history is generally unremarkable. She continues to work 4 hours a day at the [employer]. This involves a lot of walking back and forth. She does not otherwise exercise. She is tolerating the letrozole with very mild hot flashes occasionally. She denies problems with vaginal dryness.  REVIEW OF SYSTEMS: Brandi Bowen has some knee stiffness when she sits for a long time. This is not going to be related to her letrozole. 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 Otherwise she denies unusual headaches, visual changes, nausea, vomiting, dizziness, gait imbalance, cough, phlegm production, pleurisy, worsening shortness of breath, chest pain or pressure, or any change in bowel or bladder habits. A detailed review of systems was otherwise stable.    PAST MEDICAL HISTORY: Past Medical History  Diagnosis Date  . Hydronephrosis   . Cancer     rt breast ca  . Breast cancer   . Hypothyroidism   . Vitamin D deficiency 02/25/2012    PAST SURGICAL HISTORY: Past Surgical  History  Procedure Laterality Date  . Mastectomy      FAMILY HISTORY Family History  Problem Relation Age of Onset  . Cancer Mother     Colon cancer  . Cancer Brother     Prostate CA  the patient's mother died with colon cancer at the age of 33. This was diagnosed a few months before her death. The patient's father lived to be 39. Brandi Bowen had 2 brothers and one sister. One brother was diagnosed with prostate cancer at the age  of 4.  GYNECOLOGIC HISTORY:  Menarche age 82, change of life in her early 57s. She is GX P0.  SOCIAL HISTORY:   (Updated 01/12/2014) She currently works part-time at a Medical sales representative as Freight forwarder. Her husband Brandi Bowen") is a Airline pilot. He has a history of heart disease. Sons Brandi Bowen and Brandi Bowen, both of whom live in [place], are both in the swimming pool business. The patient has 3 biological grandchildren and several step grandchildren. She attends [employer]    ADVANCED DIRECTIVES:    HEALTH MAINTENANCE: (Updated 01/12/2014) History  Substance Use Topics  . Smoking status: Never Smoker   . Smokeless tobacco: Never Used  . Alcohol Use: No     Comment: Rare glass of wine     Colonoscopy: Not on file  PAP: Not on file  Bone density:  01/22/2012, normal  Lipid panel: Not on file/ Dr. Rayford Bowen   Allergies  Allergen Reactions  . Shrimp [Shellfish Allergy]     Current Outpatient Prescriptions  Medication Sig Dispense Refill  . Cholecalciferol ([employer] VIT D 5000 HIGH-POTENCY PO) Take by mouth. Kal Vit D. Take 5000 U daily      . letrozole (FEMARA) 2.5 MG tablet Take 1 tablet (2.5 mg total) by mouth daily.  90 tablet  3  . Multiple Vitamin (MULTIVITAMIN) capsule Take 1 capsule by mouth daily.  90 capsule  4  . Specialty Vitamins Products (ONE-A-DAY BONE STRENGTH PO) Take 1 tablet by mouth 3 (three) times daily.      Marland Kitchen UNABLE TO FIND Milk thistle Tumeric       No current facility-administered medications for this visit.    OBJECTIVE: older white woman who appears well in no acute distress Filed Vitals:   07/18/14 0904  BP: 145/93  Pulse: 82  Temp: 99 F (37.2 C)  Resp: 20     Body mass index is 31.63 kg/(m^2).    ECOG FS:1 - Symptomatic but completely ambulatory Filed Weights   07/18/14 0904  Weight: 195 lb 14.4 oz (88.86 kg)   Sclerae unicteric, pupils equal and reactive Oropharynx clear and moist No cervical or supraclavicular adenopathy Lungs  no rales or rhonchi Heart regular rate and rhythm Abd soft, obese, nontender, positive bowel sounds MSK no focal spinal tenderness, no upper extremity lymphedema Neuro: nonfocal, well oriented, appropriate affect Breasts: The right breast is status post mastectomy. There is no evidence of chest wall recurrence. The right axilla is benign. The left breast is unremarkable.     LAB RESULTS:   Lab Results  Component Value Date   WBC 5.2 01/12/2014   NEUTROABS 3.1 01/12/2014   HGB 13.5 01/12/2014   HCT 40.4 01/12/2014   MCV 87.4 01/12/2014   PLT 247 01/12/2014      Chemistry      Component Value Date/Time   NA 144 01/12/2014 0828   NA 139 05/21/2012 1513   K 4.0 01/12/2014 0828   K 4.0 05/21/2012  1513   CL 100 05/21/2012 1513   CO2 25 01/12/2014 0828   CO2 27 05/21/2012 1513   BUN 19.4 01/12/2014 0828   BUN 13 05/21/2012 1513   CREATININE 0.9 01/12/2014 0828   CREATININE 0.75 05/21/2012 1513      Component Value Date/Time   CALCIUM 9.8 01/12/2014 0828   CALCIUM 10.5 05/21/2012 1513   ALKPHOS 77 01/12/2014 0828   ALKPHOS 79 05/21/2012 1513   AST 28 01/12/2014 0828   AST 31 05/21/2012 1513   ALT 26 01/12/2014 0828   ALT 25 05/21/2012 1513   BILITOT 0.31 01/12/2014 0828   BILITOT 0.3 05/21/2012 1513       STUDIES: DUAL X-RAY ABSORPTIOMETRY (DXA) FOR BONE MINERAL DENSITY  FINDINGS:  AP LUMBAR SPINE L2-L4  Bone Mineral Density (BMD): 1.090 g/cm2  Young Adult T-Score: 0.1  Z-Score: 2.5  Left FEMUR neck  Bone Mineral Density (BMD): 0.746 g/cm2  Young Adult T-Score: -0.9  Z-Score: 1.1  ASSESSMENT: Patient's diagnostic category is NORMAL by WHO Criteria.  FRACTURE RISK: NOT INCREASED  FRAX: Not calculated due to T-score at or above -1.0.  COMPARISON: No statistically significant change in bone density over  the lumbar spine compared to 01/24/2012. No statistically  significant change in bone density over the total left hip compared  to 2013.    Most recent left screening mammogram on  10/29/2013 was unremarkable.   ASSESSMENT: 73 y.o. [place] woman  1. Status post right modified radical mastectomy on 06/619/2012 for an mpT4b pN1, stage IIIB invasive ductal carcinoma, grade 3, estrogen receptor positive at 95%, progesterone receptor positive at 70%, with an MIB-1 of 15% and no HER-2 amplification by FISH.  2. Status post 4 cycles of adjuvant chemotherapy with fluorouracil, epirubicin and cyclophosphamide from 05/10/2011 through 07/12/2011.   3. Status post 4 cycles of adjuvant docetaxel from 08/02/2011 through 10/04/2011.   4. Status post adjuvant radiation therapy from 11/18/2011 through 01/02/2012.   5. Started Arimidex 1 mg by mouth daily on 01/09/2012, later switched to exemestane, discontinued because of cost, started on letrozole February 2014.  The plan is to continue antiestrogen therapy for total of 5 years (until March 2018).  6. DEXA scan 02/03/2014 was ormal  PLAN: Brandi Bowen is doing well from a breast cancer point of view, now 3 years out from her definitive surgery. She is tolerating the letrozole well. The plan is to continue letrozole for a total of 5 years, and possibly for a total of 10 years depending on results of pending studies.  She does not have a primary care physician. Today we discussed colonoscopy. She understands this is normal part of screening routine. She is not interested in having one, but if she had one she would like it to be done by her surgeon Madalyn Rob in [facility].  Dayanira has a good understanding of the overall plan. She agrees with it. She knows the goal of treatment in her cases cure. She will call with any problems that may develop before the next visit here. Chauncey Cruel, MD   07/18/2014 9:26 AM

## 2014-07-18 NOTE — Telephone Encounter (Signed)
per pof to sch pt appt-sch & gave pt copy of sch °

## 2014-10-22 ENCOUNTER — Other Ambulatory Visit: Payer: Self-pay | Admitting: Nurse Practitioner

## 2014-10-31 ENCOUNTER — Ambulatory Visit: Payer: Medicare Other

## 2014-12-08 ENCOUNTER — Telehealth: Payer: Self-pay | Admitting: Oncology

## 2014-12-08 NOTE — Telephone Encounter (Signed)
pt cld to get time & date of appt-gave pt sch appt times

## 2015-01-09 ENCOUNTER — Other Ambulatory Visit: Payer: Self-pay | Admitting: *Deleted

## 2015-01-09 DIAGNOSIS — Z853 Personal history of malignant neoplasm of breast: Secondary | ICD-10-CM

## 2015-01-09 MED ORDER — LETROZOLE 2.5 MG PO TABS: 2.5000 mg | ORAL_TABLET | Freq: Every day | ORAL | Status: DC

## 2015-05-18 ENCOUNTER — Other Ambulatory Visit: Payer: Self-pay

## 2015-05-18 DIAGNOSIS — Z1231 Encounter for screening mammogram for malignant neoplasm of breast: Secondary | ICD-10-CM

## 2015-05-18 DIAGNOSIS — Z9011 Acquired absence of right breast and nipple: Secondary | ICD-10-CM

## 2015-05-23 ENCOUNTER — Ambulatory Visit
Admission: RE | Admit: 2015-05-23 | Discharge: 2015-05-23 | Disposition: A | Discharge status: Home / Self Care | Payer: Medicare Other | Source: Ambulatory Visit

## 2015-05-23 DIAGNOSIS — Z1231 Encounter for screening mammogram for malignant neoplasm of breast: Secondary | ICD-10-CM

## 2015-05-23 DIAGNOSIS — Z9011 Acquired absence of right breast and nipple: Secondary | ICD-10-CM

## 2015-07-14 ENCOUNTER — Other Ambulatory Visit: Payer: Self-pay | Admitting: *Deleted

## 2015-07-14 DIAGNOSIS — C50919 Malignant neoplasm of unspecified site of unspecified female breast: Secondary | ICD-10-CM

## 2015-07-15 ENCOUNTER — Other Ambulatory Visit: Payer: Self-pay | Admitting: Oncology

## 2015-07-15 DIAGNOSIS — M8580 Other specified disorders of bone density and structure, unspecified site: Secondary | ICD-10-CM

## 2015-07-17 ENCOUNTER — Other Ambulatory Visit (HOSPITAL_BASED_OUTPATIENT_CLINIC_OR_DEPARTMENT_OTHER): Payer: Medicare Other

## 2015-07-17 DIAGNOSIS — M8580 Other specified disorders of bone density and structure, unspecified site: Secondary | ICD-10-CM

## 2015-07-17 DIAGNOSIS — C50912 Malignant neoplasm of unspecified site of left female breast: Secondary | ICD-10-CM | POA: Diagnosis present

## 2015-07-17 DIAGNOSIS — C50919 Malignant neoplasm of unspecified site of unspecified female breast: Secondary | ICD-10-CM

## 2015-07-17 LAB — COMPREHENSIVE METABOLIC PANEL (CC13)
ALT: 14 U/L (ref 0–55)
AST: 17 U/L (ref 5–34)
Albumin: 4 g/dL (ref 3.5–5.0)
Alkaline Phosphatase: 59 U/L (ref 40–150)
Anion Gap: 9 mEq/L (ref 3–11)
BUN: 18.4 mg/dL (ref 7.0–26.0)
CHLORIDE: 107 meq/L (ref 98–109)
CO2: 26 meq/L (ref 22–29)
Calcium: 9.5 mg/dL (ref 8.4–10.4)
Creatinine: 0.8 mg/dL (ref 0.6–1.1)
EGFR: 70 mL/min/{1.73_m2} — AB (ref >90)
Glucose: 129 mg/dl (ref 70–140)
POTASSIUM: 3.8 meq/L (ref 3.5–5.1)
Sodium: 143 mEq/L (ref 136–145)
Total Bilirubin: 0.25 mg/dL (ref 0.20–1.20)
Total Protein: 6.7 g/dL (ref 6.4–8.3)

## 2015-07-17 LAB — CBC WITH DIFFERENTIAL/PLATELET
BASO%: 0.8 % (ref 0.0–2.0)
Basophils Absolute: 0 10*3/uL (ref 0.0–0.1)
EOS ABS: 0.2 10*3/uL (ref 0.0–0.5)
EOS%: 3.7 % (ref 0.0–7.0)
HCT: 37.5 % (ref 34.8–46.6)
HGB: 12.6 g/dL (ref 11.6–15.9)
LYMPH%: 26.7 % (ref 14.0–49.7)
MCH: 29.1 pg (ref 25.1–34.0)
MCHC: 33.5 g/dL (ref 31.5–36.0)
MCV: 86.9 fL (ref 79.5–101.0)
MONO#: 0.5 10*3/uL (ref 0.1–0.9)
MONO%: 7.8 % (ref 0.0–14.0)
NEUT#: 3.7 10*3/uL (ref 1.5–6.5)
NEUT%: 61 % (ref 38.4–76.8)
Platelets: 267 10*3/uL (ref 145–400)
RBC: 4.32 10*6/uL (ref 3.70–5.45)
RDW: 13.8 % (ref 11.2–14.5)
WBC: 6.1 10*3/uL (ref 3.9–10.3)
lymph#: 1.6 10*3/uL (ref 0.9–3.3)

## 2015-07-18 LAB — VITAMIN D 25 HYDROXY (VIT D DEFICIENCY, FRACTURES): VIT D 25 HYDROXY: 64 ng/mL (ref 30–100)

## 2015-07-24 ENCOUNTER — Telehealth: Payer: Self-pay | Admitting: Oncology

## 2015-07-24 ENCOUNTER — Ambulatory Visit (HOSPITAL_BASED_OUTPATIENT_CLINIC_OR_DEPARTMENT_OTHER): Payer: Medicare Other | Admitting: Oncology

## 2015-07-24 VITALS — BP 133/86 | HR 87 | Temp 98.3°F | Resp 18 | Ht 66.0 in | Wt 193.6 lb

## 2015-07-24 DIAGNOSIS — Z853 Personal history of malignant neoplasm of breast: Secondary | ICD-10-CM

## 2015-07-24 DIAGNOSIS — C50919 Malignant neoplasm of unspecified site of unspecified female breast: Secondary | ICD-10-CM

## 2015-07-24 MED ORDER — LETROZOLE 2.5 MG PO TABS: 2.5000 mg | ORAL_TABLET | Freq: Every day | ORAL | Status: DC

## 2015-07-24 NOTE — Telephone Encounter (Signed)
Pt confirmed labs/ov per 09/19 POF, gave pt AVS and Calendar... KJ °

## 2015-07-24 NOTE — Progress Notes (Signed)
Patient ID: Brandi Bowen, female   DOB: 1941/09/24, 74 y.o.   MRN: [id number] ID: Brandi Bowen OB: Dec 14, 1940  MR#: [id number]  CHY#:[id number]  PCP: no PCP GYN:   SU:  OTHER MD: Thea Silversmith, MD, Roque Cash PA-C  CHIEF COMPLAINT:  Hx of Right Breast Cancer    HISTORY OF PRESENT ILLNESS: From Dr. Rada Hay intake note dated 04/17/2011:  "This is a pleasant 74 year old woman from [facility] referred by Dr. Christian Mate for evaluation and treatment of breast cancer.    Ms. Tung contracted a mass on the right breast approximately 1-1/2 years ago.  She had previously had cysts on her breast and had never actually had a mammogram.  She ultimately was referred to Dr. Christian Mate for evaluation.  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  The tissue was reported as ER/PR positive, HER2 negative.  The patient elected to have no imaging and go immediately to mastectomy which was performed on 03/27/2011.  Final pathology showed a tumor greater than 7 cm.  Margins were essentially negative.  There was nipple invasion present with ulceration as well as multifocal skin involvement with nests of tumor in dermal lymphatics.  This was essentially DCIS.  An axillary node dissection showed a total involvement of 5 out of 6 lymph nodes, tumor involving the nipple adjacent to the mass measuring 7 cm contiguous nodule, 1.6 cm microscopic areas of tumor growth.   The patient had a staging CT scan ordered by a medical oncologist in [facility].  A bone scan showed multiple areas of activity, thought to be related to degenerative disease, large fibroid with resultant right hydronephrosis seen.  A CT scan of the chest, abdomen, and pelvis performed on 04/11/2011 showed small thyroid nodules.  No obvious lung nodules.  The  liver was free of any obvious focal lesions.  In the pelvis,  there was an enlarged, lobulated, heterogeneous type uterus was seen, measuring 15 x 16 cm.  There were multiple fibroid tumors present with the largest appearing to cause right hydroureter and necrosis.    It was recommended to her that she undergo adjuvant chemotherapy and radiation.  Of note is that at baseline, her CA27 and CA29  have been at 69.7 on 04/08/2011.  Surgery had been performed the week before."  Her subsequent history is as detailed below  INTERVAL HISTORY: Brandi Bowen returns today for followup of her breast cancer. She continues on letrozole, which she obtains had a very good price, about $5 a month. She has been very minimal hot flashes and no night sweats from this. She never has had vaginal dryness or arthralgias/myalgias associated with this medication.   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 So long as she is walking its fine. If she sits for a while and then stands it hurts. She has no intention of seeing an orthopedist regarding this. 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 I have suggested she get a fit it or otherwise cancer steps. Aside from these issues a detailed review of systems today was noncontributory.    PAST MEDICAL HISTORY: Past Medical History  Diagnosis Date  . Hydronephrosis   . Cancer     rt breast ca  . Breast cancer   . Hypothyroidism   . Vitamin  D deficiency 02/25/2012    PAST SURGICAL HISTORY: Past Surgical History  Procedure Laterality Date  . Mastectomy      FAMILY HISTORY Family History  Problem Relation Age of Onset  . Cancer Mother     Colon cancer  . Cancer Brother     Prostate CA  the patient's mother died with colon cancer at the age of 62. This was diagnosed a few months before her death. The patient's father lived to be 36. Brandi Bowen had 2 brothers and one sister. One brother was diagnosed with prostate cancer at the age of  50.  GYNECOLOGIC HISTORY:  Menarche age 74, change of life in her early 79s. She is GX P0.  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 Her husband Federico Flake") is a Airline pilot. He has a history of heart disease and is anticoagulated. Sons [name] and Winthrop, both of whom live in [place], are both in the swimming pool business. The patient has 3 biological grandchildren and several step grandchildren. She attends [employer]    ADVANCED DIRECTIVES: Not in place   HEALTH MAINTENANCE: (Updated 01/12/2014) Social History  Substance Use Topics  . Smoking status: Never Smoker   . Smokeless tobacco: Never Used  . Alcohol Use: No     Comment: Rare glass of wine     Colonoscopy: Patient refuses  PAP: Not on file  Bone density:  02/03/2014/normal with a T score of -0.9  Lipid panel: Not on file/ Dr. Rayford Halsted   Allergies  Allergen Reactions  . Shrimp [Shellfish Allergy]     Current Outpatient Prescriptions  Medication Sig Dispense Refill  . calcium carbonate (OS-CAL) 600 MG TABS tablet Take 600 mg by mouth 2 (two) times daily with a meal.    . Cholecalciferol (CVS VIT D 5000 HIGH-POTENCY) 5000 UNITS capsule Take 1 capsule (5,000 Units total) by mouth daily. 90 capsule 4  . letrozole (FEMARA) 2.5 MG tablet Take 1 tablet (2.5 mg total) by mouth daily. 90 tablet 3  . Multiple Vitamin (MULTIVITAMIN) capsule Take 1 capsule by mouth daily. 90 capsule 4  . Omega-3 Fatty Acids (FISH OIL) 1000 MG CAPS Take by mouth.    . Probiotic Product (PROBIOTIC DAILY PO) Take by mouth.    Marland Kitchen Specialty Vitamins Products (ONE-A-DAY BONE STRENGTH PO) Take 1 tablet by mouth 3 (three) times daily.    Marland Kitchen UNABLE TO FIND Milk thistle Tumeric    . vitamin C (ASCORBIC ACID) 500 MG tablet Take 500 mg by mouth daily.     No current facility-administered medications for this visit.    OBJECTIVE: older white woman who appears stated age  74  Vitals:   07/24/15 1549  BP: 133/86  Pulse: 87  Temp: 98.3 F (36.8 C)  Resp: 18     Body mass index is 31.26 kg/(m^2).    ECOG FS:1 - Symptomatic but completely ambulatory Filed Weights   07/24/15 1549  Weight: 193 lb 9.6 oz (87.816 kg)   Sclerae unicteric, EOMs intact Oropharynx clear, dentition in good repair No cervical or supraclavicular adenopathy Lungs no rales or rhonchi Heart regular rate and rhythm Abd soft, nontender, positive bowel sounds MSK no focal spinal tenderness, no upper extremity lymphedema Neuro: nonfocal, well oriented, appropriate affect Breasts: The right breast is status post mastectomy. There is no evidence of chest wall recurrence. The right axilla is benign. The left breast is unremarkable.  LAB RESULTS:  Lab Results  Component Value Date   WBC 6.1 07/17/2015   NEUTROABS 3.7 07/17/2015   HGB 12.6 07/17/2015   HCT 37.5 07/17/2015   MCV 86.9 07/17/2015   PLT 267 07/17/2015      Chemistry      Component Value Date/Time   NA 143 07/17/2015 1556   NA 139 05/21/2012 1513   K 3.8 07/17/2015 1556   K 4.0 05/21/2012 1513   CL 100 05/21/2012 1513   CO2 26 07/17/2015 1556   CO2 27 05/21/2012 1513   BUN 18.4 07/17/2015 1556   BUN 13 05/21/2012 1513   CREATININE 0.8 07/17/2015 1556   CREATININE 0.75 05/21/2012 1513      Component Value Date/Time   CALCIUM 9.5 07/17/2015 1556   CALCIUM 10.5 05/21/2012 1513   ALKPHOS 59 07/17/2015 1556   ALKPHOS 79 05/21/2012 1513   AST 17 07/17/2015 1556   AST 31 05/21/2012 1513   ALT 14 07/17/2015 1556   ALT 25 05/21/2012 1513   BILITOT 0.25 07/17/2015 1556   BILITOT 0.3 05/21/2012 1513       STUDIES: CLINICAL DATA: Screening.  EXAM: DIGITAL SCREENING UNILATERAL LEFT MAMMOGRAM WITH CAD  COMPARISON: Previous exam(s).  ACR Breast Density Category b: There are scattered areas of fibroglandular density.  FINDINGS: The patient has had a right mastectomy. There are no findings suspicious  for malignancy.  Images were processed with CAD.  IMPRESSION: No mammographic evidence of malignancy. A result letter of this screening mammogram will be mailed directly to the patient.  RECOMMENDATION: Screening mammogram in one year. ([id number])  BI-RADS CATEGORY 1: Negative.   Electronically Signed  By: Nolon Nations M.D.  On: 05/23/2015 09:41  ASSESSMENT: 74 y.o. [place] woman  1. Status post right modified radical mastectomy on 06/619/2012 for an mpT4b pN1, stage IIIB invasive ductal carcinoma, grade 3, estrogen receptor positive at 95%, progesterone receptor positive at 70%, with an MIB-1 of 15% and no HER-2 amplification by FISH.  2. Status post 4 cycles of adjuvant chemotherapy with fluorouracil, epirubicin and cyclophosphamide from 05/10/2011 through 07/12/2011.   3. Status post 4 cycles of adjuvant docetaxel from 08/02/2011 through 10/04/2011.   4. Status post adjuvant radiation therapy from 11/18/2011 through 01/02/2012.   5. Started Arimidex 1 mg by mouth daily on 01/09/2012, later switched to exemestane, discontinued because of cost, started on letrozole February 2014.  The plan is to continue antiestrogen therapy for total of 5 years (until March 2018).  6. DEXA scan 02/03/2014 was ormal  PLAN: Brandi Bowen is 4 years out from her definitive surgery for locally advanced breast cancer. It is very favorable that there has not been a recurrence to date.  She continues on letrozole, with good tolerance. We now have data to continue letrozole for a total of 10 years. We will decide whether to stop or continue in her case when she sees me in March 2018.  A year from now however she will see our breast survivorship nurse practitioner. I think Brandi Bowen will get much from that visit. At both visits of course we will review her labs, mammography, and physical exam as well as full review of systems  She knows to call for any problems that may develop before her next  visit here. Chauncey Cruel, MD   07/24/2015 4:17 PM

## 2016-01-02 DIAGNOSIS — R0789 Other chest pain: Secondary | ICD-10-CM | POA: Diagnosis not present

## 2016-01-02 DIAGNOSIS — R739 Hyperglycemia, unspecified: Secondary | ICD-10-CM | POA: Diagnosis not present

## 2016-01-02 DIAGNOSIS — E7800 Pure hypercholesterolemia, unspecified: Secondary | ICD-10-CM | POA: Diagnosis not present

## 2016-01-02 DIAGNOSIS — E781 Pure hyperglyceridemia: Secondary | ICD-10-CM | POA: Diagnosis not present

## 2016-01-25 MED FILL — LETROZOLE 2.5 MG TABLET: 2.5 | 90 days supply | Qty: 90 | Fill #0

## 2016-02-01 DIAGNOSIS — R0789 Other chest pain: Secondary | ICD-10-CM | POA: Diagnosis not present

## 2016-02-13 DIAGNOSIS — H353131 Nonexudative age-related macular degeneration, bilateral, early dry stage: Secondary | ICD-10-CM | POA: Diagnosis not present

## 2016-02-13 DIAGNOSIS — H5213 Myopia, bilateral: Secondary | ICD-10-CM | POA: Diagnosis not present

## 2016-02-20 DIAGNOSIS — R739 Hyperglycemia, unspecified: Secondary | ICD-10-CM | POA: Diagnosis not present

## 2016-02-20 DIAGNOSIS — R0789 Other chest pain: Secondary | ICD-10-CM | POA: Diagnosis not present

## 2016-02-20 DIAGNOSIS — E7800 Pure hypercholesterolemia, unspecified: Secondary | ICD-10-CM | POA: Diagnosis not present

## 2016-02-20 DIAGNOSIS — E781 Pure hyperglyceridemia: Secondary | ICD-10-CM | POA: Diagnosis not present

## 2016-03-07 DIAGNOSIS — D485 Neoplasm of uncertain behavior of skin: Secondary | ICD-10-CM | POA: Diagnosis not present

## 2016-03-07 DIAGNOSIS — D225 Melanocytic nevi of trunk: Secondary | ICD-10-CM | POA: Diagnosis not present

## 2016-03-07 DIAGNOSIS — L814 Other melanin hyperpigmentation: Secondary | ICD-10-CM | POA: Diagnosis not present

## 2016-03-07 DIAGNOSIS — L821 Other seborrheic keratosis: Secondary | ICD-10-CM | POA: Diagnosis not present

## 2016-04-03 DIAGNOSIS — D485 Neoplasm of uncertain behavior of skin: Secondary | ICD-10-CM | POA: Diagnosis not present

## 2016-04-03 DIAGNOSIS — L905 Scar conditions and fibrosis of skin: Secondary | ICD-10-CM | POA: Diagnosis not present

## 2016-04-03 DIAGNOSIS — R238 Other skin changes: Secondary | ICD-10-CM | POA: Diagnosis not present

## 2016-04-16 MED FILL — LETROZOLE 2.5 MG TABLET: 2.5 | 90 days supply | Qty: 90 | Fill #1

## 2016-07-10 MED FILL — LETROZOLE 2.5 MG TABLET: 2.5 | 90 days supply | Qty: 90 | Fill #2

## 2016-07-17 ENCOUNTER — Other Ambulatory Visit: Payer: Medicare Other

## 2016-07-18 ENCOUNTER — Other Ambulatory Visit: Payer: Medicare Other

## 2016-07-22 ENCOUNTER — Other Ambulatory Visit: Payer: Self-pay | Admitting: *Deleted

## 2016-07-22 ENCOUNTER — Other Ambulatory Visit: Payer: Medicare Other

## 2016-07-22 ENCOUNTER — Other Ambulatory Visit (HOSPITAL_BASED_OUTPATIENT_CLINIC_OR_DEPARTMENT_OTHER): Payer: Medicare Other

## 2016-07-22 DIAGNOSIS — C50919 Malignant neoplasm of unspecified site of unspecified female breast: Secondary | ICD-10-CM

## 2016-07-22 DIAGNOSIS — Z780 Asymptomatic menopausal state: Secondary | ICD-10-CM

## 2016-07-22 DIAGNOSIS — E559 Vitamin D deficiency, unspecified: Secondary | ICD-10-CM | POA: Diagnosis not present

## 2016-07-22 DIAGNOSIS — C50912 Malignant neoplasm of unspecified site of left female breast: Secondary | ICD-10-CM | POA: Diagnosis not present

## 2016-07-22 DIAGNOSIS — C50911 Malignant neoplasm of unspecified site of right female breast: Secondary | ICD-10-CM | POA: Diagnosis not present

## 2016-07-22 LAB — COMPREHENSIVE METABOLIC PANEL
ALT: 21 U/L (ref 0–55)
ANION GAP: 10 meq/L (ref 3–11)
AST: 26 U/L (ref 5–34)
Albumin: 4 g/dL (ref 3.5–5.0)
Alkaline Phosphatase: 63 U/L (ref 40–150)
BUN: 12.8 mg/dL (ref 7.0–26.0)
CALCIUM: 9.6 mg/dL (ref 8.4–10.4)
CHLORIDE: 108 meq/L (ref 98–109)
CO2: 24 mEq/L (ref 22–29)
Creatinine: 0.8 mg/dL (ref 0.6–1.1)
EGFR: 72 mL/min/{1.73_m2} — ABNORMAL LOW (ref >90)
Glucose: 97 mg/dl (ref 70–140)
Potassium: 4 mEq/L (ref 3.5–5.1)
Sodium: 143 mEq/L (ref 136–145)
Total Bilirubin: <0.3 mg/dL (ref 0.20–1.20)
Total Protein: 7.1 g/dL (ref 6.4–8.3)

## 2016-07-22 LAB — CBC WITH DIFFERENTIAL/PLATELET
BASO%: 0.4 % (ref 0.0–2.0)
BASOS ABS: 0 10*3/uL (ref 0.0–0.1)
EOS%: 3.2 % (ref 0.0–7.0)
Eosinophils Absolute: 0.2 10*3/uL (ref 0.0–0.5)
HEMATOCRIT: 38 % (ref 34.8–46.6)
HGB: 12.9 g/dL (ref 11.6–15.9)
LYMPH#: 1.7 10*3/uL (ref 0.9–3.3)
LYMPH%: 30.2 % (ref 14.0–49.7)
MCH: 29.3 pg (ref 25.1–34.0)
MCHC: 33.9 g/dL (ref 31.5–36.0)
MCV: 86.4 fL (ref 79.5–101.0)
MONO#: 0.6 10*3/uL (ref 0.1–0.9)
MONO%: 9.7 % (ref 0.0–14.0)
NEUT#: 3.2 10*3/uL (ref 1.5–6.5)
NEUT%: 56.5 % (ref 38.4–76.8)
Platelets: 243 10*3/uL (ref 145–400)
RBC: 4.4 10*6/uL (ref 3.70–5.45)
RDW: 13.3 % (ref 11.2–14.5)
WBC: 5.7 10*3/uL (ref 3.9–10.3)

## 2016-07-23 ENCOUNTER — Other Ambulatory Visit: Payer: Self-pay | Admitting: *Deleted

## 2016-07-23 DIAGNOSIS — Z79811 Long term (current) use of aromatase inhibitors: Secondary | ICD-10-CM

## 2016-07-23 DIAGNOSIS — Z780 Asymptomatic menopausal state: Secondary | ICD-10-CM

## 2016-07-23 DIAGNOSIS — C50919 Malignant neoplasm of unspecified site of unspecified female breast: Secondary | ICD-10-CM

## 2016-07-25 ENCOUNTER — Ambulatory Visit (HOSPITAL_BASED_OUTPATIENT_CLINIC_OR_DEPARTMENT_OTHER): Payer: Medicare Other | Admitting: Adult Health

## 2016-07-25 ENCOUNTER — Encounter: Payer: Self-pay | Admitting: Adult Health

## 2016-07-25 ENCOUNTER — Other Ambulatory Visit: Payer: Self-pay | Admitting: Lab

## 2016-07-25 ENCOUNTER — Other Ambulatory Visit: Payer: Self-pay | Admitting: *Deleted

## 2016-07-25 VITALS — BP 155/89 | HR 78 | Temp 98.6°F | Resp 18 | Ht 66.0 in | Wt 197.1 lb

## 2016-07-25 DIAGNOSIS — C50912 Malignant neoplasm of unspecified site of left female breast: Secondary | ICD-10-CM

## 2016-07-25 DIAGNOSIS — Z170 Estrogen receptor positive status [ER+]: Secondary | ICD-10-CM | POA: Diagnosis not present

## 2016-07-25 DIAGNOSIS — Z79811 Long term (current) use of aromatase inhibitors: Secondary | ICD-10-CM | POA: Diagnosis not present

## 2016-07-25 DIAGNOSIS — Z780 Asymptomatic menopausal state: Secondary | ICD-10-CM

## 2016-07-25 DIAGNOSIS — E559 Vitamin D deficiency, unspecified: Secondary | ICD-10-CM

## 2016-07-25 DIAGNOSIS — Z853 Personal history of malignant neoplasm of breast: Secondary | ICD-10-CM

## 2016-07-25 DIAGNOSIS — M25561 Pain in right knee: Secondary | ICD-10-CM

## 2016-07-25 DIAGNOSIS — C50911 Malignant neoplasm of unspecified site of right female breast: Secondary | ICD-10-CM

## 2016-07-25 DIAGNOSIS — Z1231 Encounter for screening mammogram for malignant neoplasm of breast: Secondary | ICD-10-CM

## 2016-07-25 DIAGNOSIS — C50919 Malignant neoplasm of unspecified site of unspecified female breast: Secondary | ICD-10-CM

## 2016-07-25 MED ORDER — LETROZOLE 2.5 MG PO TABS: 2.5000 mg | ORAL_TABLET | Freq: Every day | ORAL | 3 refills | Status: DC

## 2016-07-25 NOTE — Progress Notes (Signed)
CLINIC:  Survivorship   REASON FOR VISIT:  Routine follow-up for history of breast cancer.   BRIEF ONCOLOGIC HISTORY:  (from Dr. Virgie Dad visit on 07/24/15)    INTERVAL HISTORY:  Brandi Bowen presents to the [facility] today for routine follow-up for her history of breast cancer.  Overall, she reports feeling well, with the exception of right knee pain.  She continues on the Letrozole with good tolerance; denies hot flashes/night sweats. She has no other joint pain but the right knee pain; this is bothering her.  The price of the medication has also gone up; she tells me that it now costs her $40/month, "but the price keeps changing."  She asks how much longer she will need to take the Letrozole.  She has some fatigue, but endorses that she has not been sleeping well.  She falls asleep watching TV in her chair and then has trouble going back to sleep when she goes to bed; she is working on changing these habits.   She has not had a mammogram this year; she does not like to get mammograms every year "because I don't want to be exposed to all of that radiation every year."  She would like to get thermography as screening for breast cancer.  Her last bone density was in 02/2014 and was normal.  She does not exercise regularly.     REVIEW OF SYSTEMS:  Review of Systems  Constitutional: Positive for malaise/fatigue.  HENT: Negative.   Eyes: Negative.   Respiratory: Negative.   Cardiovascular: Negative.   Gastrointestinal: Negative.   Genitourinary: Negative.   Musculoskeletal: Positive for joint pain.       (R) knee pain  Skin: Negative.   Neurological: Negative.   Endo/Heme/Allergies:       "Occasionally I get warm, but they aren't really hot flashes. They don't bother me."   Psychiatric/Behavioral: The patient has insomnia.   GU: Denies vaginal bleeding, discharge, or dryness.  Breast: Denies any new nodularity, masses, tenderness, nipple changes, or nipple discharge.     A 14-point review of systems was completed and was negative, except as noted above.    PAST MEDICAL/SURGICAL HISTORY:  Past Medical History:  Diagnosis Date  . Breast cancer   . Cancer    rt breast ca  . Hydronephrosis   . Hypothyroidism   . Vitamin D deficiency 02/25/2012   Past Surgical History:  Procedure Laterality Date  . MASTECTOMY       ALLERGIES:  Allergies  Allergen Reactions  . Shrimp [Shellfish Allergy]      CURRENT MEDICATIONS:  Outpatient Encounter Prescriptions as of 07/25/2016  Medication Sig  . Cholecalciferol (CVS VIT D 5000 HIGH-POTENCY) 5000 UNITS capsule Take 1 capsule (5,000 Units total) by mouth daily.  Marland Kitchen letrozole (FEMARA) 2.5 MG tablet Take 1 tablet (2.5 mg total) by mouth daily.  . Multiple Vitamin (MULTIVITAMIN) capsule Take 1 capsule by mouth daily.  . Omega-3 Fatty Acids (FISH OIL) 1000 MG CAPS Take by mouth.  . Probiotic Product (PROBIOTIC DAILY PO) Take by mouth.  Marland Kitchen Specialty Vitamins Products (ONE-A-DAY BONE STRENGTH PO) Take 1 tablet by mouth 3 (three) times daily.  Marland Kitchen UNABLE TO FIND Milk thistle Tumeric  . vitamin C (ASCORBIC ACID) 500 MG tablet Take 500 mg by mouth daily.  . [DISCONTINUED] calcium carbonate (OS-CAL) 600 MG TABS tablet Take 600 mg by mouth 2 (two) times daily with a meal.   No facility-administered encounter medications on file as of 07/25/2016.  ONCOLOGIC FAMILY HISTORY:  Family History  Problem Relation Age of Onset  . Cancer Mother     Colon cancer  . Cancer Brother     Prostate CA    GENETIC COUNSELING/TESTING: None.  SOCIAL HISTORY:  ELANTRA CAPRARA is married and lives with her husband in [place], [place].  She has adult twin sons; 1 lives in [place], [place] and the other lives in [place], [place].  She has 2 grandchildren, 60 granddaughter age 43 and 44 grandson age 78 (they live in [place]).  Ms. 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  She denies any tobacco or illicit drug use. She drinks alcohol occasionally.    PHYSICAL EXAMINATION:  Vital Signs: Vitals:   07/25/16 0909  BP: (!) 155/89  Pulse: 78  Resp: 18  Temp: 98.6 F (37 C)   Filed Weights   07/25/16 0909  Weight: 197 lb 1.6 oz (89.4 kg)   General: Well-nourished, well-appearing female in no acute distress.  She is unaccompanied today.   HEENT: Head is normocephalic.  Pupils equal and reactive to light. Conjunctivae clear without exudate.  Sclerae anicteric. Oral mucosa is pink, moist.  Oropharynx is pink without lesions or erythema.  Lymph: No cervical, supraclavicular, or infraclavicular lymphadenopathy noted on palpation.  Cardiovascular: Regular rate and rhythm.Marland Kitchen Respiratory: Clear to auscultation bilaterally. Chest expansion symmetric; breathing non-labored.  Breast Exam:  -Left breast: No appreciable masses on palpation. No skin redness, thickening, or peau d'orange appearance; no nipple retraction or nipple discharge; scattered course, raised nevi in various sizes and colors on chest and abdomen.  -Right breast: s/p mastectomy; no nodularity or masses palpated to chest wall; mastectomy scar healed without nodularity or erythema; again, scattered course, raised nevi in various sizes and colors on chest and abdomen. -Axilla: No axillary adenopathy bilaterally.  GI: Abdomen soft and round; non-tender, non-distended. Bowel sounds normoactive. No hepatosplenomegaly.   GU: Deferred.  Neuro: No focal deficits. Steady gait.  Psych: Mood and affect normal and appropriate for situation.  Extremities: No edema. Skin: Warm and dry.  LABORATORY DATA:  CBC    Component Value Date/Time   WBC 5.7 07/22/2016 1516   WBC 6.1 05/02/2011 1230   RBC 4.40 07/22/2016 1516   RBC 4.58 05/02/2011 1230   HGB 12.9 07/22/2016 1516   HCT 38.0 07/22/2016 1516   PLT 243 07/22/2016 1516   MCV 86.4 07/22/2016 1516   MCH 29.3 07/22/2016 1516   MCH 28.8 05/02/2011 1230    MCHC 33.9 07/22/2016 1516   MCHC 33.5 05/02/2011 1230   RDW 13.3 07/22/2016 1516   LYMPHSABS 1.7 07/22/2016 1516   MONOABS 0.6 07/22/2016 1516   EOSABS 0.2 07/22/2016 1516   BASOSABS 0.0 07/22/2016 1516   CMP Latest Ref Rng & Units 07/22/2016 07/17/2015 07/18/2014  Glucose 70 - 140 mg/dl 97 129 104  BUN 7.0 - 26.0 mg/dL 12.8 18.4 17.7  Creatinine 0.6 - 1.1 mg/dL 0.8 0.8 0.8  Sodium 136 - 145 mEq/L 143 143 143  Potassium 3.5 - 5.1 mEq/L 4.0 3.8 4.5  Chloride 96 - 112 mEq/L - - -  CO2 22 - 29 mEq/L _0 Calcium 8.4 - 10.4 mg/dL 9.6 9.5 10.0  Total Protein 6.4 - 8.3 g/dL 7.1 6.7 6.9  Total Bilirubin 0.20 - 1.20 mg/dL <0.30 0.25 0.24  Alkaline Phos 40 - 150 U/L 63 59 58  AST 5 - 34 U/L 26 17 26  ALT 0 - 55 U/L _0 Vitamin D level pending (patient requested)   *Labs reviewed with patient in detail today; they are largely stable/within normal limits.   DIAGNOSTIC IMAGING:  Most recent mammogram: 05/23/15     ASSESSMENT AND PLAN:  Ms.. Swantek is a pleasant 75 y.o. 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  She went on to have adjuvant radiation, which completed on 01/02/12.  She initially started on anastrozole in 01/2012, then switched to exemestane, then switched to Letrozole in 12/2012.  She [facility] for surveillance and routine follow-up.   1. History of Stage IIIB right breast cancer:  Ms. Jaskulski is currently clinically  without evidence of disease or recurrence of breast cancer.  She is due for annual left breast screening mammogram (see #2 below).  She will follow-up with her medical oncologist, Dr. Jana Hakim, in 01/2017. This will be her 5-year of anti-estrogen therapy visit.  We discussed the opportunity of extending the letrozole beyond 5 years, as we have data  that suggests that 10 years may be better than 5 years in some patients (see #3 below).  Encouraged her to call me with any questions or concerns before her next visit to the cancer center and I could see her sooner, if needed.   2. Annual mammogram vs. thermography: I shared with Ms. Bilotta that there are not enough research studies/scientific evidence to conclude that thermography provides the same detection of breast cancer as mammograms.  Our national organizations continue to conclude that the mammogram remains the best diagnostic tool for early detection.  I let her know that I therefore could not recommend thermography, particularly in a patient who has had breast cancer. She was largely dissatisfied with this answer and is resistant to get mammograms annually.  However, after further discussion, she did allow for me to order her mammogram to be done sometime in 12/2016 before she sees Dr. Jana Hakim again in 01/2017.    3. Extension of aromatase inhibitor use discussion: We discussed that new data has emerged suggesting that 10 years of anti-estrogen therapy is better than 5 years in some women.  I shared with her that the Breast Cancer Index testing is available to help [place] determine which of those women may benefit most. I let her know that if she chooses to have this testing done, that it would provide [place] with more information regarding her risk of recurrence (either low or high) as well as the likelihood of benefit from extension of anti-estrogen therapy (low or high likelihood).  Given that she has right knee pain that bothers her and the cost of the Letrozole has increased for her, she is interested in pursuing Breast Cancer Index testing to help better inform her decision regarding anti-estrogen therapy beyond 5 years (which for her would be in 01/2017).  She states, "I can keep taking the Letrozole if I need to, but if my knee would stop hurting when I stop the medication then that would be great."   Therefore, we decided to move forward with Breast Cancer Index testing to help her decide what may be best for her going forward.  I explained that it may take a few weeks to get the results and we would call her with them; she will also have further  discussion with Dr. Jana Hakim at her visit in 01/2017.   4. Right knee pain: This right knee pain pain is chronic and largely not changed from previous visits.  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  Encouraged her to try OTC Tylenol or NSAIDs to help manage the pain, as needed.  She also takes Tumeric, which seems to help the inflammation and her symptoms as well.  The knee pain is one of the biggest factors in her decision to continue on the letrozole or not.  She will continue on Letrozole through at least 01/2017; Breast Cancer Index results pending.   5. Bone health:  Given Ms. Mizell's age, history of breast cancer, and her current anti-estrogen therapy with Letrozole, she is at risk for bone demineralization. Her last DEXA scan was on 02/03/14 and was normal.  I let her know that she was due for biennial testing. She again is concerned about radiation exposure with these tests, but agreed to have the DEXA scan done at the same time as her mammogram sometime in 12/2016.  In the meantime, she was encouraged to increase her consumption of foods rich in calcium, as well as increase her weight-bearing activities.  She was given education on specific food and activities to promote bone health.  6. Cancer screening:  Due to Ms. Cregger's history and her age, she should receive screening for skin cancers and colon cancer. She has several raised, course moles on her chest and abdomen.  She tells me that she sees a dermatologist once per year; encouraged her to maintain her follow-up with her dermatologist as directed.  She has not had a colonoscopy and does not wish to have one. She tells me her doctor  ordered stool sample testing several years ago that was negative.  She was encouraged to follow-up with her PCP for appropriate cancer screenings.   7. Health maintenance and wellness promotion: She was  encouraged to engage in moderate to vigorous exercise for 30 minutes per day most days of the week. She was instructed to limit her alcohol consumption and continue to abstain from tobacco use.    Dispo:  -Breast Cancer Index ordered today -Left screening mammogram and DEXA scan due now; patient prefers to wait until 12/2016; ordres placed. -Return to cancer center to see Dr. Jana Hakim in 01/2017.   A total of 35 minutes of face-to-face time was spent with this patient with greater than 50% of that time in counseling and care-coordination.   Mike Craze, NP Survivorship Program [facility] [phone]   Note: PRIMARY CARE PROVIDER [place]ill Cotta, [place] [phone]

## 2016-07-26 ENCOUNTER — Telehealth: Payer: Self-pay | Admitting: Adult Health

## 2016-07-26 ENCOUNTER — Telehealth: Payer: Self-pay | Admitting: Oncology

## 2016-07-26 LAB — VITAMIN D 25 HYDROXY (VIT D DEFICIENCY, FRACTURES): Vitamin D, 25-Hydroxy: 56.5 ng/mL (ref 30.0–100.0)

## 2016-07-26 NOTE — Telephone Encounter (Signed)
I called Ms. Trainer to follow-up with her, as requested, to give her the Vitamin D level now that results are available.  Her Vitamin D is 56.7, which is only slightly lower than it was in 04/2015 at 64.  Encouraged her to continue her Vitamin D supplementation.   I also let her know that unfortunately, she does not qualify for Breast Cancer Index (BCI) testing because she had 5 lymph nodes positive at the time of her surgery.  The cutoff to have the test performed is 3 LNs positive.  I apologized for any confusion and recommended she continue taking the Letrozole at least until her visit with Dr. Jana Hakim in 01/2017, which will complete 5 years of therapy.  She voiced understanding and agreed with this plan.  Encouraged her to call me with any additional questions or concerns. She voiced appreciation for my call.   Mike Craze, NP [place] [phone]

## 2016-07-26 NOTE — Telephone Encounter (Signed)
lvm or pt to return call to r/s her March appt per LOS

## 2016-10-10 MED FILL — LETROZOLE 2.5 MG TABLET: 2.5 | 90 days supply | Qty: 90 | Fill #0

## 2016-11-15 ENCOUNTER — Other Ambulatory Visit: Payer: Self-pay | Admitting: Emergency Medicine

## 2016-11-15 ENCOUNTER — Other Ambulatory Visit: Payer: Self-pay | Admitting: Adult Health

## 2016-11-15 DIAGNOSIS — Z780 Asymptomatic menopausal state: Secondary | ICD-10-CM

## 2016-11-15 DIAGNOSIS — C50912 Malignant neoplasm of unspecified site of left female breast: Secondary | ICD-10-CM

## 2016-11-15 DIAGNOSIS — Z79811 Long term (current) use of aromatase inhibitors: Secondary | ICD-10-CM

## 2016-11-15 DIAGNOSIS — C50911 Malignant neoplasm of unspecified site of right female breast: Secondary | ICD-10-CM

## 2016-11-18 ENCOUNTER — Other Ambulatory Visit: Payer: Self-pay | Admitting: *Deleted

## 2016-11-18 DIAGNOSIS — M8580 Other specified disorders of bone density and structure, unspecified site: Secondary | ICD-10-CM

## 2016-11-18 DIAGNOSIS — E2839 Other primary ovarian failure: Secondary | ICD-10-CM

## 2016-12-17 ENCOUNTER — Ambulatory Visit: Payer: Medicare Other

## 2016-12-17 ENCOUNTER — Other Ambulatory Visit: Payer: Medicare Other

## 2016-12-20 ENCOUNTER — Ambulatory Visit
Admission: RE | Admit: 2016-12-20 | Discharge: 2016-12-20 | Disposition: A | Discharge status: Home / Self Care | Payer: Medicare HMO | Source: Ambulatory Visit | Attending: Oncology | Admitting: Oncology

## 2016-12-20 ENCOUNTER — Ambulatory Visit
Admission: RE | Admit: 2016-12-20 | Discharge: 2016-12-20 | Disposition: A | Discharge status: Home / Self Care | Payer: Medicare HMO | Source: Ambulatory Visit | Attending: Adult Health | Admitting: Adult Health

## 2016-12-20 DIAGNOSIS — Z780 Asymptomatic menopausal state: Secondary | ICD-10-CM | POA: Diagnosis not present

## 2016-12-20 DIAGNOSIS — Z1231 Encounter for screening mammogram for malignant neoplasm of breast: Secondary | ICD-10-CM

## 2016-12-20 DIAGNOSIS — M85852 Other specified disorders of bone density and structure, left thigh: Secondary | ICD-10-CM | POA: Diagnosis not present

## 2016-12-20 DIAGNOSIS — M8580 Other specified disorders of bone density and structure, unspecified site: Secondary | ICD-10-CM

## 2016-12-20 DIAGNOSIS — E2839 Other primary ovarian failure: Secondary | ICD-10-CM

## 2017-01-13 MED FILL — LETROZOLE 2.5 MG TABLET: 2.5 | 90 days supply | Qty: 90 | Fill #1

## 2017-01-14 ENCOUNTER — Other Ambulatory Visit: Payer: Self-pay | Admitting: Adult Health

## 2017-01-14 DIAGNOSIS — C50911 Malignant neoplasm of unspecified site of right female breast: Secondary | ICD-10-CM

## 2017-01-14 DIAGNOSIS — C50912 Malignant neoplasm of unspecified site of left female breast: Principal | ICD-10-CM

## 2017-01-14 DIAGNOSIS — E559 Vitamin D deficiency, unspecified: Secondary | ICD-10-CM

## 2017-01-14 DIAGNOSIS — Z170 Estrogen receptor positive status [ER+]: Principal | ICD-10-CM

## 2017-01-16 ENCOUNTER — Other Ambulatory Visit (HOSPITAL_BASED_OUTPATIENT_CLINIC_OR_DEPARTMENT_OTHER): Payer: Medicare HMO

## 2017-01-16 DIAGNOSIS — C50912 Malignant neoplasm of unspecified site of left female breast: Secondary | ICD-10-CM | POA: Diagnosis not present

## 2017-01-16 DIAGNOSIS — C50911 Malignant neoplasm of unspecified site of right female breast: Secondary | ICD-10-CM

## 2017-01-16 DIAGNOSIS — E559 Vitamin D deficiency, unspecified: Secondary | ICD-10-CM | POA: Diagnosis not present

## 2017-01-16 DIAGNOSIS — Z170 Estrogen receptor positive status [ER+]: Principal | ICD-10-CM

## 2017-01-16 LAB — CBC WITH DIFFERENTIAL/PLATELET
BASO%: 0.7 % (ref 0.0–2.0)
Basophils Absolute: 0.1 10*3/uL (ref 0.0–0.1)
EOS ABS: 0.2 10*3/uL (ref 0.0–0.5)
EOS%: 2.5 % (ref 0.0–7.0)
HCT: 38.4 % (ref 34.8–46.6)
HEMOGLOBIN: 13 g/dL (ref 11.6–15.9)
LYMPH%: 17 % (ref 14.0–49.7)
MCH: 29.3 pg (ref 25.1–34.0)
MCHC: 33.9 g/dL (ref 31.5–36.0)
MCV: 86.4 fL (ref 79.5–101.0)
MONO#: 0.6 10*3/uL (ref 0.1–0.9)
MONO%: 7.5 % (ref 0.0–14.0)
NEUT%: 72.3 % (ref 38.4–76.8)
NEUTROS ABS: 5.5 10*3/uL (ref 1.5–6.5)
Platelets: 278 10*3/uL (ref 145–400)
RBC: 4.45 10*6/uL (ref 3.70–5.45)
RDW: 14.5 % (ref 11.2–14.5)
WBC: 7.6 10*3/uL (ref 3.9–10.3)
lymph#: 1.3 10*3/uL (ref 0.9–3.3)

## 2017-01-16 LAB — COMPREHENSIVE METABOLIC PANEL
ALT: 18 U/L (ref 0–55)
AST: 23 U/L (ref 5–34)
Albumin: 4 g/dL (ref 3.5–5.0)
Alkaline Phosphatase: 67 U/L (ref 40–150)
Anion Gap: 10 mEq/L (ref 3–11)
BUN: 17.3 mg/dL (ref 7.0–26.0)
CO2: 27 meq/L (ref 22–29)
Calcium: 9.7 mg/dL (ref 8.4–10.4)
Chloride: 107 mEq/L (ref 98–109)
Creatinine: 1 mg/dL (ref 0.6–1.1)
EGFR: 57 mL/min/{1.73_m2} — AB (ref >90)
GLUCOSE: 107 mg/dL (ref 70–140)
POTASSIUM: 4 meq/L (ref 3.5–5.1)
SODIUM: 143 meq/L (ref 136–145)
TOTAL PROTEIN: 6.9 g/dL (ref 6.4–8.3)
Total Bilirubin: 0.57 mg/dL (ref 0.20–1.20)

## 2017-01-17 ENCOUNTER — Telehealth: Payer: Self-pay

## 2017-01-17 LAB — VITAMIN D 25 HYDROXY (VIT D DEFICIENCY, FRACTURES): Vitamin D, 25-Hydroxy: 68.9 ng/mL (ref 30.0–100.0)

## 2017-01-17 NOTE — Telephone Encounter (Signed)
lvm Vit D in good range.

## 2017-01-17 NOTE — Telephone Encounter (Signed)
-----   Message from Gardenia Phlegm, NP sent at 01/17/2017  8:31 AM EDT ----- Please call patient with results

## 2017-01-23 ENCOUNTER — Ambulatory Visit: Payer: Medicare Other | Admitting: Oncology

## 2017-02-18 DIAGNOSIS — H5213 Myopia, bilateral: Secondary | ICD-10-CM | POA: Diagnosis not present

## 2017-02-18 DIAGNOSIS — H353131 Nonexudative age-related macular degeneration, bilateral, early dry stage: Secondary | ICD-10-CM | POA: Diagnosis not present

## 2017-02-19 DIAGNOSIS — R69 Illness, unspecified: Secondary | ICD-10-CM | POA: Diagnosis not present

## 2017-03-04 ENCOUNTER — Ambulatory Visit (HOSPITAL_BASED_OUTPATIENT_CLINIC_OR_DEPARTMENT_OTHER): Payer: Medicare HMO | Admitting: Oncology

## 2017-03-04 DIAGNOSIS — Z170 Estrogen receptor positive status [ER+]: Secondary | ICD-10-CM | POA: Diagnosis not present

## 2017-03-04 DIAGNOSIS — Z853 Personal history of malignant neoplasm of breast: Secondary | ICD-10-CM | POA: Insufficient documentation

## 2017-03-04 DIAGNOSIS — C50911 Malignant neoplasm of unspecified site of right female breast: Secondary | ICD-10-CM | POA: Diagnosis not present

## 2017-03-04 DIAGNOSIS — Z79811 Long term (current) use of aromatase inhibitors: Secondary | ICD-10-CM | POA: Diagnosis not present

## 2017-03-04 DIAGNOSIS — C50811 Malignant neoplasm of overlapping sites of right female breast: Secondary | ICD-10-CM

## 2017-03-04 NOTE — Progress Notes (Signed)
Patient ID: Brandi Bowen, female   DOB: October 03, 1941, 76 y.o.   MRN: [id number] ID: Brandi Bowen OB: [birth year]  MR#: [id number]  YQM#:[id number]  PCP: no PCP GYN:   SU:  OTHER MD: Brandi Silversmith, MD, Brandi Cash PA-C  CHIEF COMPLAINT:  Estrogen receptor positive breast cancer  CURRENT TREATMENT: Completing 5 years of anti-estrogens    HISTORY OF PRESENT ILLNESS: From Brandi Bowen intake note dated 04/17/2011:  "This is a pleasant 76 year old woman from [facility] referred by Dr. Christian Bowen for evaluation and treatment of breast cancer.    Brandi Bowen contracted a mass on the right breast approximately 1-1/2 years ago.  She had previously had cysts on her breast and had never actually had a mammogram.  She ultimately was referred to Dr. Christian Bowen for evaluation.  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  The tissue was reported as ER/PR positive, HER2 negative.  The patient elected to have no imaging and go immediately to mastectomy which was performed on 03/27/2011.  Final pathology showed a tumor greater than 7 cm.  Margins were essentially negative.  There was nipple invasion present with ulceration as well as multifocal skin involvement with nests of tumor in dermal lymphatics.  This was essentially DCIS.  An axillary node dissection showed a total involvement of 5 out of 6 lymph nodes, tumor involving the nipple adjacent to the mass measuring 7 cm contiguous nodule, 1.6 cm microscopic areas of tumor growth.   The patient had a staging CT scan ordered by a medical oncologist in [facility].  A bone scan showed multiple areas of activity, thought to be related to degenerative disease, large fibroid with resultant right hydronephrosis seen.  A CT scan of the chest, abdomen, and pelvis performed on 04/11/2011 showed small thyroid nodules.  No obvious lung  nodules.  The  liver was free of any obvious focal lesions.  In the pelvis, there was an enlarged, lobulated, heterogeneous type uterus was seen, measuring 15 x 16 cm.  There were multiple fibroid tumors present with the largest appearing to cause right hydroureter and necrosis.    It was recommended to her that she undergo adjuvant chemotherapy and radiation.  Of note is that at baseline, her CA27 and CA29  have been at 69.7 on 04/08/2011.  Surgery had been performed the week before."  Her subsequent history is as detailed below  INTERVAL HISTORY: Brandi Bowen returns today for follow-up of her estrogen receptor positive breast cancer. She continues on letrozole, with good tolerance.  Hot flashes and vaginal dryness are not a major issue. She never developed the arthralgias or myalgias that many patients can experience on this medication. She obtains it at a good price.  Unfortunately Brandi Bowen's husband is currently in has skilled nursing facility, with hospice support. He is not expected to survive more than a few days from his metastatic bladder carcinoma. She tells me he is confused and does not know her sometimes and this is very painful to her. They're age-year-old grandson and the rest of the family is coming this weekend and she hopes he can hold on until then.  REVIEW OF SYSTEMS: Brandi Bowen is grieving appropriately and doing the best she can regarding her husband's difficult situation. She is not exercising regularly. A detailed review of systems today was otherwise stable    PAST MEDICAL HISTORY: Past Medical History:  Diagnosis  Date  . Breast cancer ([place])   . Cancer ([place])    rt breast ca  . Hydronephrosis   . Hypothyroidism   . Vitamin D deficiency 02/25/2012    PAST SURGICAL HISTORY: Past Surgical History:  Procedure Laterality Date  . MASTECTOMY      FAMILY HISTORY Family History  Problem Relation Age of Onset  . Cancer Mother     Colon cancer  . Cancer Brother     Prostate CA   the patient's mother died with colon cancer at the age of 35. This was diagnosed a few months before her death. The patient's father lived to be 59. Brandi Bowen had 2 brothers and one sister. One brother was diagnosed with prostate cancer at the age of 11.  GYNECOLOGIC HISTORY:  Menarche age 71, change of life in her early 14s. She is GX P0.  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 Her husband Brandi Bowen") is a Airline pilot. He has a history of heart disease and is anticoagulated. Sons Brandi Bowen and Brandi Bowen, both of whom live in [place], are both in the swimming pool business. The patient has 3 biological grandchildren and several step grandchildren. She attends [employer]    ADVANCED DIRECTIVES: Not in place   HEALTH MAINTENANCE: (Updated 01/12/2014) Social History  Substance Use Topics  . Smoking status: Never Smoker  . Smokeless tobacco: Never Used  . Alcohol use No     Comment: Rare glass of wine     Colonoscopy: Patient refuses  PAP: Not on file  Bone density:  02/03/2014/normal with a T score of -0.9  Lipid panel: Not on file/ Dr. Rayford Bowen   Allergies  Allergen Reactions  . Shrimp [Shellfish Allergy]     Current Outpatient Prescriptions  Medication Sig Dispense Refill  . Cholecalciferol (CVS VIT D 5000 HIGH-POTENCY) 5000 UNITS capsule Take 1 capsule (5,000 Units total) by mouth daily. 90 capsule 4  . Multiple Vitamin (MULTIVITAMIN) capsule Take 1 capsule by mouth daily. 90 capsule 4  . Omega-3 Fatty Acids (FISH OIL) 1000 MG CAPS Take by mouth.    . Probiotic Product (PROBIOTIC DAILY PO) Take by mouth.    Marland Kitchen Specialty Vitamins Products (ONE-A-DAY BONE STRENGTH PO) Take 1 tablet by mouth 3 (three) times daily.    Marland Kitchen UNABLE TO FIND Milk thistle Tumeric    . vitamin C (ASCORBIC ACID) 500 MG tablet Take 500 mg by mouth daily.     No current facility-administered medications for this visit.     OBJECTIVE: older  white woman In no acute distress  Vitals:   03/04/17 0859  BP: (!) 147/85  Pulse: 78  Resp: 20  Temp: 98 F (36.7 C)     Body mass index is 31.39 kg/m.    ECOG FS:1 - Symptomatic but completely ambulatory Filed Weights   03/04/17 0859  Weight: 194 lb 8 oz (88.2 kg)   Sclerae unicteric, pupils round and equal Oropharynx clear and moist No cervical or supraclavicular adenopathy Lungs no rales or rhonchi Heart regular rate and rhythm Abd soft, obese, nontender, positive bowel sounds MSK no focal spinal tenderness, no upper extremity lymphedema Neuro: nonfocal, well oriented, appropriate affect Breasts: The right breast is status post mastectomy, with no evidence of chest wall recurrence. The left breast is unremarkable. Both axillae are benign.  LAB RESULTS:   Lab Results  Component Value Date   WBC 7.6 01/16/2017   NEUTROABS 5.5  01/16/2017   HGB 13.0 01/16/2017   HCT 38.4 01/16/2017   MCV 86.4 01/16/2017   PLT 278 01/16/2017      Chemistry      Component Value Date/Time   NA 143 01/16/2017 0858   K 4.0 01/16/2017 0858   CL 100 05/21/2012 1513   CO2 27 01/16/2017 0858   BUN 17.3 01/16/2017 0858   CREATININE 1.0 01/16/2017 0858      Component Value Date/Time   CALCIUM 9.7 01/16/2017 0858   ALKPHOS 67 01/16/2017 0858   AST 23 01/16/2017 0858   ALT 18 01/16/2017 0858   BILITOT 0.57 01/16/2017 0858       STUDIES: Mammography at the [facility] 12/20/2016 to the breast density to be category B. There was no evidence of malignancy.  ASSESSMENT: 76 y.o. [place] woman  1. Status post right modified radical mastectomy on 06/619/2012 for an mpT4b pN1, stage IIIB invasive ductal carcinoma, grade 3, estrogen receptor positive at 95%, progesterone receptor positive at 70%, with an MIB-1 of 15% and no HER-2 amplification by FISH.  2. Status post 4 cycles of adjuvant chemotherapy with fluorouracil, epirubicin and cyclophosphamide from 05/10/2011 through 07/12/2011.    3. Status post 4 cycles of adjuvant docetaxel from 08/02/2011 through 10/04/2011.   4. Status post adjuvant radiation therapy from 11/18/2011 through 01/02/2012.   5. Started Arimidex 1 mg by mouth daily on 01/09/2012, later switched to exemestane, discontinued because of cost, started on letrozole February 2014.  Stopped May 2018  6. DEXA scan 02/03/2014 was normal  (a) repeat bone density 12/20/2016 showed a T score of -1.1  PLAN: Brandi Bowen is now just about 6 years out from definitive surgery for her breast cancer with no evidence of disease recurrence. This is very favorable.  She is completing 5 years of anti-estrogens. We discussed the fact that some patients do take letrozole for a total of 7 years. The benefit of the 2 extra years is very small, in their 1% range. On the other hand side effects can be significant, in particular with regards to bone density issues.  After much discussion we decided she would stop the letrozole after she runs out of her current supply, which will be sometime in May of this year.  I gave her information on the [employer] program, the trails to recovery program, and our tai chi and yoga classes here.  We discussed releasing her from follow-up here, but she likes the idea of seeing is once a year and in any case right now she is going through a very difficult. Because of her husband's upcoming death. Accordingly she will return to see me in one year.  She knows to call for any problems that may develop before that visit. Chauncey Cruel, MD   03/04/2017 9:23 AM

## 2017-04-04 DIAGNOSIS — M5441 Lumbago with sciatica, right side: Secondary | ICD-10-CM | POA: Diagnosis not present

## 2017-04-04 DIAGNOSIS — M546 Pain in thoracic spine: Secondary | ICD-10-CM | POA: Diagnosis not present

## 2017-04-04 DIAGNOSIS — M9903 Segmental and somatic dysfunction of lumbar region: Secondary | ICD-10-CM | POA: Diagnosis not present

## 2017-04-04 DIAGNOSIS — M9902 Segmental and somatic dysfunction of thoracic region: Secondary | ICD-10-CM | POA: Diagnosis not present

## 2017-04-07 DIAGNOSIS — M9903 Segmental and somatic dysfunction of lumbar region: Secondary | ICD-10-CM | POA: Diagnosis not present

## 2017-04-07 DIAGNOSIS — M5441 Lumbago with sciatica, right side: Secondary | ICD-10-CM | POA: Diagnosis not present

## 2017-04-07 DIAGNOSIS — M9902 Segmental and somatic dysfunction of thoracic region: Secondary | ICD-10-CM | POA: Diagnosis not present

## 2017-04-07 DIAGNOSIS — M546 Pain in thoracic spine: Secondary | ICD-10-CM | POA: Diagnosis not present

## 2017-04-09 DIAGNOSIS — M546 Pain in thoracic spine: Secondary | ICD-10-CM | POA: Diagnosis not present

## 2017-04-09 DIAGNOSIS — M5441 Lumbago with sciatica, right side: Secondary | ICD-10-CM | POA: Diagnosis not present

## 2017-04-09 DIAGNOSIS — M9903 Segmental and somatic dysfunction of lumbar region: Secondary | ICD-10-CM | POA: Diagnosis not present

## 2017-04-09 DIAGNOSIS — M9902 Segmental and somatic dysfunction of thoracic region: Secondary | ICD-10-CM | POA: Diagnosis not present

## 2017-04-14 DIAGNOSIS — M9903 Segmental and somatic dysfunction of lumbar region: Secondary | ICD-10-CM | POA: Diagnosis not present

## 2017-04-14 DIAGNOSIS — M9902 Segmental and somatic dysfunction of thoracic region: Secondary | ICD-10-CM | POA: Diagnosis not present

## 2017-04-14 DIAGNOSIS — M5441 Lumbago with sciatica, right side: Secondary | ICD-10-CM | POA: Diagnosis not present

## 2017-04-14 DIAGNOSIS — M546 Pain in thoracic spine: Secondary | ICD-10-CM | POA: Diagnosis not present

## 2017-04-17 DIAGNOSIS — M546 Pain in thoracic spine: Secondary | ICD-10-CM | POA: Diagnosis not present

## 2017-04-17 DIAGNOSIS — M5441 Lumbago with sciatica, right side: Secondary | ICD-10-CM | POA: Diagnosis not present

## 2017-04-17 DIAGNOSIS — M9903 Segmental and somatic dysfunction of lumbar region: Secondary | ICD-10-CM | POA: Diagnosis not present

## 2017-04-17 DIAGNOSIS — M9902 Segmental and somatic dysfunction of thoracic region: Secondary | ICD-10-CM | POA: Diagnosis not present

## 2017-04-21 DIAGNOSIS — M5441 Lumbago with sciatica, right side: Secondary | ICD-10-CM | POA: Diagnosis not present

## 2017-04-21 DIAGNOSIS — M546 Pain in thoracic spine: Secondary | ICD-10-CM | POA: Diagnosis not present

## 2017-04-21 DIAGNOSIS — M9902 Segmental and somatic dysfunction of thoracic region: Secondary | ICD-10-CM | POA: Diagnosis not present

## 2017-04-21 DIAGNOSIS — M9903 Segmental and somatic dysfunction of lumbar region: Secondary | ICD-10-CM | POA: Diagnosis not present

## 2017-04-24 DIAGNOSIS — M9902 Segmental and somatic dysfunction of thoracic region: Secondary | ICD-10-CM | POA: Diagnosis not present

## 2017-04-24 DIAGNOSIS — M9903 Segmental and somatic dysfunction of lumbar region: Secondary | ICD-10-CM | POA: Diagnosis not present

## 2017-04-24 DIAGNOSIS — M546 Pain in thoracic spine: Secondary | ICD-10-CM | POA: Diagnosis not present

## 2017-04-24 DIAGNOSIS — M5441 Lumbago with sciatica, right side: Secondary | ICD-10-CM | POA: Diagnosis not present

## 2017-04-28 DIAGNOSIS — M9903 Segmental and somatic dysfunction of lumbar region: Secondary | ICD-10-CM | POA: Diagnosis not present

## 2017-04-28 DIAGNOSIS — M5441 Lumbago with sciatica, right side: Secondary | ICD-10-CM | POA: Diagnosis not present

## 2017-04-28 DIAGNOSIS — M546 Pain in thoracic spine: Secondary | ICD-10-CM | POA: Diagnosis not present

## 2017-04-28 DIAGNOSIS — M9902 Segmental and somatic dysfunction of thoracic region: Secondary | ICD-10-CM | POA: Diagnosis not present

## 2017-05-05 DIAGNOSIS — M5441 Lumbago with sciatica, right side: Secondary | ICD-10-CM | POA: Diagnosis not present

## 2017-05-05 DIAGNOSIS — M9903 Segmental and somatic dysfunction of lumbar region: Secondary | ICD-10-CM | POA: Diagnosis not present

## 2017-05-05 DIAGNOSIS — M9902 Segmental and somatic dysfunction of thoracic region: Secondary | ICD-10-CM | POA: Diagnosis not present

## 2017-05-05 DIAGNOSIS — M546 Pain in thoracic spine: Secondary | ICD-10-CM | POA: Diagnosis not present

## 2017-05-26 DIAGNOSIS — M9902 Segmental and somatic dysfunction of thoracic region: Secondary | ICD-10-CM | POA: Diagnosis not present

## 2017-05-26 DIAGNOSIS — M9903 Segmental and somatic dysfunction of lumbar region: Secondary | ICD-10-CM | POA: Diagnosis not present

## 2017-05-26 DIAGNOSIS — M546 Pain in thoracic spine: Secondary | ICD-10-CM | POA: Diagnosis not present

## 2017-05-26 DIAGNOSIS — M5441 Lumbago with sciatica, right side: Secondary | ICD-10-CM | POA: Diagnosis not present

## 2017-06-11 DIAGNOSIS — M9902 Segmental and somatic dysfunction of thoracic region: Secondary | ICD-10-CM | POA: Diagnosis not present

## 2017-06-11 DIAGNOSIS — M5441 Lumbago with sciatica, right side: Secondary | ICD-10-CM | POA: Diagnosis not present

## 2017-06-11 DIAGNOSIS — M546 Pain in thoracic spine: Secondary | ICD-10-CM | POA: Diagnosis not present

## 2017-06-11 DIAGNOSIS — M9903 Segmental and somatic dysfunction of lumbar region: Secondary | ICD-10-CM | POA: Diagnosis not present

## 2017-08-13 DIAGNOSIS — R69 Illness, unspecified: Secondary | ICD-10-CM | POA: Diagnosis not present

## 2017-09-10 DIAGNOSIS — D485 Neoplasm of uncertain behavior of skin: Secondary | ICD-10-CM | POA: Diagnosis not present

## 2017-09-10 DIAGNOSIS — L821 Other seborrheic keratosis: Secondary | ICD-10-CM | POA: Diagnosis not present

## 2017-09-10 DIAGNOSIS — D225 Melanocytic nevi of trunk: Secondary | ICD-10-CM | POA: Diagnosis not present

## 2017-10-07 DIAGNOSIS — R030 Elevated blood-pressure reading, without diagnosis of hypertension: Secondary | ICD-10-CM | POA: Diagnosis not present

## 2017-10-07 DIAGNOSIS — Z0000 Encounter for general adult medical examination without abnormal findings: Secondary | ICD-10-CM | POA: Diagnosis not present

## 2017-10-07 DIAGNOSIS — R739 Hyperglycemia, unspecified: Secondary | ICD-10-CM | POA: Diagnosis not present

## 2017-10-07 DIAGNOSIS — E7800 Pure hypercholesterolemia, unspecified: Secondary | ICD-10-CM | POA: Diagnosis not present

## 2017-10-21 DIAGNOSIS — E7800 Pure hypercholesterolemia, unspecified: Secondary | ICD-10-CM | POA: Diagnosis not present

## 2017-10-21 DIAGNOSIS — Z79899 Other long term (current) drug therapy: Secondary | ICD-10-CM | POA: Diagnosis not present

## 2017-10-21 DIAGNOSIS — R739 Hyperglycemia, unspecified: Secondary | ICD-10-CM | POA: Diagnosis not present

## 2018-01-19 ENCOUNTER — Telehealth: Payer: Self-pay | Admitting: Oncology

## 2018-01-19 NOTE — Telephone Encounter (Signed)
Called patient regarding voicemail  °

## 2018-01-20 ENCOUNTER — Other Ambulatory Visit: Payer: Self-pay | Admitting: Cardiology

## 2018-01-20 ENCOUNTER — Ambulatory Visit: Payer: Medicare HMO | Admitting: Oncology

## 2018-01-20 ENCOUNTER — Other Ambulatory Visit: Payer: Medicare HMO

## 2018-01-20 DIAGNOSIS — Z1231 Encounter for screening mammogram for malignant neoplasm of breast: Secondary | ICD-10-CM

## 2018-01-20 DIAGNOSIS — R030 Elevated blood-pressure reading, without diagnosis of hypertension: Secondary | ICD-10-CM | POA: Diagnosis not present

## 2018-01-20 DIAGNOSIS — R5383 Other fatigue: Secondary | ICD-10-CM | POA: Diagnosis not present

## 2018-01-20 DIAGNOSIS — E7800 Pure hypercholesterolemia, unspecified: Secondary | ICD-10-CM | POA: Diagnosis not present

## 2018-01-20 DIAGNOSIS — E559 Vitamin D deficiency, unspecified: Secondary | ICD-10-CM | POA: Diagnosis not present

## 2018-01-20 DIAGNOSIS — R079 Chest pain, unspecified: Secondary | ICD-10-CM | POA: Diagnosis not present

## 2018-01-20 DIAGNOSIS — R739 Hyperglycemia, unspecified: Secondary | ICD-10-CM | POA: Diagnosis not present

## 2018-02-10 DIAGNOSIS — R030 Elevated blood-pressure reading, without diagnosis of hypertension: Secondary | ICD-10-CM | POA: Diagnosis not present

## 2018-02-10 DIAGNOSIS — E7800 Pure hypercholesterolemia, unspecified: Secondary | ICD-10-CM | POA: Diagnosis not present

## 2018-02-17 DIAGNOSIS — H52229 Regular astigmatism, unspecified eye: Secondary | ICD-10-CM | POA: Diagnosis not present

## 2018-03-05 DIAGNOSIS — R69 Illness, unspecified: Secondary | ICD-10-CM | POA: Diagnosis not present

## 2018-05-19 DIAGNOSIS — R030 Elevated blood-pressure reading, without diagnosis of hypertension: Secondary | ICD-10-CM | POA: Diagnosis not present

## 2018-05-19 DIAGNOSIS — Z79899 Other long term (current) drug therapy: Secondary | ICD-10-CM | POA: Diagnosis not present

## 2018-05-19 DIAGNOSIS — E559 Vitamin D deficiency, unspecified: Secondary | ICD-10-CM | POA: Diagnosis not present

## 2018-05-19 DIAGNOSIS — E7800 Pure hypercholesterolemia, unspecified: Secondary | ICD-10-CM | POA: Diagnosis not present

## 2018-05-19 DIAGNOSIS — R739 Hyperglycemia, unspecified: Secondary | ICD-10-CM | POA: Diagnosis not present

## 2018-05-26 DIAGNOSIS — R030 Elevated blood-pressure reading, without diagnosis of hypertension: Secondary | ICD-10-CM | POA: Diagnosis not present

## 2018-05-26 DIAGNOSIS — R739 Hyperglycemia, unspecified: Secondary | ICD-10-CM | POA: Diagnosis not present

## 2018-05-26 DIAGNOSIS — E7800 Pure hypercholesterolemia, unspecified: Secondary | ICD-10-CM | POA: Diagnosis not present

## 2018-08-17 DIAGNOSIS — M5441 Lumbago with sciatica, right side: Secondary | ICD-10-CM | POA: Diagnosis not present

## 2018-08-17 DIAGNOSIS — M9903 Segmental and somatic dysfunction of lumbar region: Secondary | ICD-10-CM | POA: Diagnosis not present

## 2018-08-17 DIAGNOSIS — M546 Pain in thoracic spine: Secondary | ICD-10-CM | POA: Diagnosis not present

## 2018-08-17 DIAGNOSIS — M9902 Segmental and somatic dysfunction of thoracic region: Secondary | ICD-10-CM | POA: Diagnosis not present

## 2018-08-19 DIAGNOSIS — M5441 Lumbago with sciatica, right side: Secondary | ICD-10-CM | POA: Diagnosis not present

## 2018-08-19 DIAGNOSIS — M9903 Segmental and somatic dysfunction of lumbar region: Secondary | ICD-10-CM | POA: Diagnosis not present

## 2018-08-19 DIAGNOSIS — M546 Pain in thoracic spine: Secondary | ICD-10-CM | POA: Diagnosis not present

## 2018-08-19 DIAGNOSIS — M9902 Segmental and somatic dysfunction of thoracic region: Secondary | ICD-10-CM | POA: Diagnosis not present

## 2018-08-20 DIAGNOSIS — M9902 Segmental and somatic dysfunction of thoracic region: Secondary | ICD-10-CM | POA: Diagnosis not present

## 2018-08-20 DIAGNOSIS — M546 Pain in thoracic spine: Secondary | ICD-10-CM | POA: Diagnosis not present

## 2018-08-20 DIAGNOSIS — M5441 Lumbago with sciatica, right side: Secondary | ICD-10-CM | POA: Diagnosis not present

## 2018-08-20 DIAGNOSIS — M9903 Segmental and somatic dysfunction of lumbar region: Secondary | ICD-10-CM | POA: Diagnosis not present

## 2018-08-24 DIAGNOSIS — M9903 Segmental and somatic dysfunction of lumbar region: Secondary | ICD-10-CM | POA: Diagnosis not present

## 2018-08-24 DIAGNOSIS — M546 Pain in thoracic spine: Secondary | ICD-10-CM | POA: Diagnosis not present

## 2018-08-24 DIAGNOSIS — M5441 Lumbago with sciatica, right side: Secondary | ICD-10-CM | POA: Diagnosis not present

## 2018-08-24 DIAGNOSIS — M9902 Segmental and somatic dysfunction of thoracic region: Secondary | ICD-10-CM | POA: Diagnosis not present

## 2018-08-27 DIAGNOSIS — M546 Pain in thoracic spine: Secondary | ICD-10-CM | POA: Diagnosis not present

## 2018-08-27 DIAGNOSIS — M5441 Lumbago with sciatica, right side: Secondary | ICD-10-CM | POA: Diagnosis not present

## 2018-08-27 DIAGNOSIS — M9903 Segmental and somatic dysfunction of lumbar region: Secondary | ICD-10-CM | POA: Diagnosis not present

## 2018-08-27 DIAGNOSIS — R69 Illness, unspecified: Secondary | ICD-10-CM | POA: Diagnosis not present

## 2018-08-27 DIAGNOSIS — M9902 Segmental and somatic dysfunction of thoracic region: Secondary | ICD-10-CM | POA: Diagnosis not present

## 2018-08-31 DIAGNOSIS — M546 Pain in thoracic spine: Secondary | ICD-10-CM | POA: Diagnosis not present

## 2018-08-31 DIAGNOSIS — M5441 Lumbago with sciatica, right side: Secondary | ICD-10-CM | POA: Diagnosis not present

## 2018-08-31 DIAGNOSIS — M9902 Segmental and somatic dysfunction of thoracic region: Secondary | ICD-10-CM | POA: Diagnosis not present

## 2018-08-31 DIAGNOSIS — M9903 Segmental and somatic dysfunction of lumbar region: Secondary | ICD-10-CM | POA: Diagnosis not present

## 2018-09-07 DIAGNOSIS — M546 Pain in thoracic spine: Secondary | ICD-10-CM | POA: Diagnosis not present

## 2018-09-07 DIAGNOSIS — M9903 Segmental and somatic dysfunction of lumbar region: Secondary | ICD-10-CM | POA: Diagnosis not present

## 2018-09-07 DIAGNOSIS — M5441 Lumbago with sciatica, right side: Secondary | ICD-10-CM | POA: Diagnosis not present

## 2018-09-07 DIAGNOSIS — M9902 Segmental and somatic dysfunction of thoracic region: Secondary | ICD-10-CM | POA: Diagnosis not present

## 2018-09-10 DIAGNOSIS — M9903 Segmental and somatic dysfunction of lumbar region: Secondary | ICD-10-CM | POA: Diagnosis not present

## 2018-09-10 DIAGNOSIS — M9902 Segmental and somatic dysfunction of thoracic region: Secondary | ICD-10-CM | POA: Diagnosis not present

## 2018-09-10 DIAGNOSIS — M5441 Lumbago with sciatica, right side: Secondary | ICD-10-CM | POA: Diagnosis not present

## 2018-09-10 DIAGNOSIS — M546 Pain in thoracic spine: Secondary | ICD-10-CM | POA: Diagnosis not present

## 2018-09-15 DIAGNOSIS — E7800 Pure hypercholesterolemia, unspecified: Secondary | ICD-10-CM | POA: Diagnosis not present

## 2018-09-15 DIAGNOSIS — R739 Hyperglycemia, unspecified: Secondary | ICD-10-CM | POA: Diagnosis not present

## 2018-09-15 DIAGNOSIS — Z79899 Other long term (current) drug therapy: Secondary | ICD-10-CM | POA: Diagnosis not present

## 2018-09-15 DIAGNOSIS — R9431 Abnormal electrocardiogram [ECG] [EKG]: Secondary | ICD-10-CM | POA: Diagnosis not present

## 2018-09-15 DIAGNOSIS — R0602 Shortness of breath: Secondary | ICD-10-CM | POA: Diagnosis not present

## 2018-09-15 DIAGNOSIS — E559 Vitamin D deficiency, unspecified: Secondary | ICD-10-CM | POA: Diagnosis not present

## 2018-09-17 DIAGNOSIS — M5441 Lumbago with sciatica, right side: Secondary | ICD-10-CM | POA: Diagnosis not present

## 2018-09-17 DIAGNOSIS — M9903 Segmental and somatic dysfunction of lumbar region: Secondary | ICD-10-CM | POA: Diagnosis not present

## 2018-09-17 DIAGNOSIS — M9902 Segmental and somatic dysfunction of thoracic region: Secondary | ICD-10-CM | POA: Diagnosis not present

## 2018-09-17 DIAGNOSIS — M546 Pain in thoracic spine: Secondary | ICD-10-CM | POA: Diagnosis not present

## 2018-09-22 DIAGNOSIS — R0602 Shortness of breath: Secondary | ICD-10-CM | POA: Diagnosis not present

## 2018-09-22 DIAGNOSIS — E7800 Pure hypercholesterolemia, unspecified: Secondary | ICD-10-CM | POA: Diagnosis not present

## 2018-09-22 DIAGNOSIS — R030 Elevated blood-pressure reading, without diagnosis of hypertension: Secondary | ICD-10-CM | POA: Diagnosis not present

## 2018-09-22 DIAGNOSIS — R739 Hyperglycemia, unspecified: Secondary | ICD-10-CM | POA: Diagnosis not present

## 2018-09-24 DIAGNOSIS — M546 Pain in thoracic spine: Secondary | ICD-10-CM | POA: Diagnosis not present

## 2018-09-24 DIAGNOSIS — M5441 Lumbago with sciatica, right side: Secondary | ICD-10-CM | POA: Diagnosis not present

## 2018-09-24 DIAGNOSIS — M9903 Segmental and somatic dysfunction of lumbar region: Secondary | ICD-10-CM | POA: Diagnosis not present

## 2018-09-24 DIAGNOSIS — M9902 Segmental and somatic dysfunction of thoracic region: Secondary | ICD-10-CM | POA: Diagnosis not present

## 2018-10-08 DIAGNOSIS — M9902 Segmental and somatic dysfunction of thoracic region: Secondary | ICD-10-CM | POA: Diagnosis not present

## 2018-10-08 DIAGNOSIS — M9903 Segmental and somatic dysfunction of lumbar region: Secondary | ICD-10-CM | POA: Diagnosis not present

## 2018-10-08 DIAGNOSIS — M5441 Lumbago with sciatica, right side: Secondary | ICD-10-CM | POA: Diagnosis not present

## 2018-10-08 DIAGNOSIS — M546 Pain in thoracic spine: Secondary | ICD-10-CM | POA: Diagnosis not present

## 2018-10-22 DIAGNOSIS — M9902 Segmental and somatic dysfunction of thoracic region: Secondary | ICD-10-CM | POA: Diagnosis not present

## 2018-10-22 DIAGNOSIS — M546 Pain in thoracic spine: Secondary | ICD-10-CM | POA: Diagnosis not present

## 2018-10-22 DIAGNOSIS — M9903 Segmental and somatic dysfunction of lumbar region: Secondary | ICD-10-CM | POA: Diagnosis not present

## 2018-10-22 DIAGNOSIS — M5441 Lumbago with sciatica, right side: Secondary | ICD-10-CM | POA: Diagnosis not present

## 2019-12-29 ENCOUNTER — Telehealth: Payer: Self-pay | Admitting: *Deleted

## 2019-12-29 NOTE — Telephone Encounter (Signed)
Attempted to call the paitent to schedule an appt. Left the patient a message to call the office back.

## 2019-12-30 NOTE — Telephone Encounter (Signed)
Patient called back yesterday, patient stated "I was wondering why I still need to come. I was told that I could see Dr Julien Girt in a year." Explained that I would have Dr Julien Girt office call her

## 2020-01-06 ENCOUNTER — Telehealth: Payer: Self-pay | Admitting: *Deleted

## 2020-01-06 NOTE — Telephone Encounter (Signed)
Patient called and scheduled an appt for 3/15

## 2020-01-12 NOTE — Progress Notes (Addendum)
GYNECOLOGIC ONCOLOGY NEW PATIENT CONSULTATION   Patient Name: Brandi Bowen  Patient Age: 79 y.o. Date of Service: 3/15 Referring Provider: Gwendel Hanson [facility]  [address] [place],  [place] [address]   Primary Care Provider: [name], PA-C Consulting Provider: Jeral Pinch, MD   Assessment/Plan:  Postmenopausal patient with limited episode of vaginal spotting approximately 6 months ago in the setting of a known, enlarged, fibroid uterus.  Given her exam, endometrial sampling is not possible.  I discussed my concern that even in the operating room under anesthesia, I do not think it will be possible to vaginally sample her endometrium.  Additionally, given the number of uterine fibroids and distortion of her endometrial cavity, they were not able to assess her endometrial lining on outside pelvic ultrasound.  We discussed that overall the most likely cause for postmenopausal bleeding is atrophy.  I think the risk that her bleeding was related to hyperplasia or cancer is quite low given its limited nature and no recurrent bleeding.  Without the ability to assess her lining by ultrasound or sampling vaginally, we are left with a discussion regarding the need for definitive surgery (hysterectomy) to rule out uterine pathology.  We discussed that because of the size of her uterus, especially the lower uterine segment, it would likely require an open surgery and possibly a modified radical hysterectomy.  The patient lives alone here, works part-time, and would like to avoid surgery.  I think it is very reasonable to defer any further work-up given the radical nature of surgery that would be required to proceed with hysterectomy and what I feel to be the low risk that there is significant and concerning uterine pathology.  I recommend that we repeat an ultrasound about 6 months after her last 1 to assure no significant change in the size of her uterus or the ability  to see her endometrial lining.  I have also stressed the importance of calling clinic to be seen sooner if she has any recurrence of vaginal bleeding.  The patient was comfortable with this plan and I will call her with the results of her pelvic ultrasound in June.  Discussed in general the risk factors for uterine cancer, notably excess estrogen exposure.  We discussed the role that adipose tissue plays in conversion of hormones to estrogen.  The patient is motivated to work on weight loss and will do this over the next 6 months or so.  A copy of this note was sent to the patient's referring provider.   45 minutes of total time was spent for this patient encounter, including preparation, face-to-face counseling with the patient and coordination of care, and documentation of the encounter.   Jeral Pinch, MD  Division of Gynecologic Oncology  Department of Obstetrics and Gynecology  [facility]  ___________________________________________  Chief Complaint: Chief Complaint  Patient presents with  . Post-menopausal bleeding    New patient    History of Present Illness:  Brandi Bowen is a 79 y.o. y.o. female who is seen in consultation at the request of [name], PA-C for an evaluation of postmenopausal bleeding in the setting of a large fibroid uterus.  Was seen initially for PMB (5-6 days of light bleeding in October of 2020, no further episodes). This was the first episodes of PMB since menopause in her 89s. Had  A CT at [facility]. Pelvic ultrasound showed fibroid uterus, endometrium not measured. Gynecologist  unable to measure endometrial lining or perform EMB/pap due to exam. EUA performed and cervix could not be visualized. CA-125 and CEA are wnl.  Patient reports today that she went through menopause around the age of 56.  Last September or October she had 5 days of very light vaginal spotting which she describes as pink noted on  the tissue when she wiped after urinating.  She denies having any blood visible on the peripad denies any associated pain or cramping.  She denies any vaginal discharge.  She had no subsequent vaginal bleeding.  PAST MEDICAL HISTORY:  Past Medical History:  Diagnosis Date  . Breast cancer ([place])   . Cancer ([place])    rt breast ca  . Hydronephrosis   . Hyperlipidemia   . Hypothyroidism   . Uterine leiomyoma   . Vitamin D deficiency 02/25/2012     PAST SURGICAL HISTORY:  Past Surgical History:  Procedure Laterality Date  . MASTECTOMY      OB/GYN HISTORY:  OB History  Gravida Para Term Preterm AB Living  0 0 0 0 0 0  SAB TAB Ectopic Multiple Live Births  0 0 0 0 0    No LMP recorded. Patient is postmenopausal.  Age at menarche: 81 Age at menopause: 4 Hx of HRT: no Hx of STDs: denies Last pap: years ago History of abnormal pap smears: no  SCREENING STUDIES:  Last mammogram: 2020  Last colonoscopy: n/a Last bone mineral density: 2018  MEDICATIONS: Outpatient Encounter Medications as of 01/17/2020  Medication Sig  . Cholecalciferol ([employer] VIT D 5000 HIGH-POTENCY) 5000 UNITS capsule Take 1 capsule (5,000 Units total) by mouth daily.  . Multiple Vitamin (MULTIVITAMIN) capsule Take 1 capsule by mouth daily.  . Omega-3 Fatty Acids (FISH OIL) 1000 MG CAPS Take by mouth.  . Probiotic Product (PROBIOTIC DAILY PO) Take by mouth.  Marland Kitchen Specialty Vitamins Products (ONE-A-DAY BONE STRENGTH PO) Take 1 tablet by mouth 3 (three) times daily.  Marland Kitchen UNABLE TO FIND Milk thistle Tumeric  . vitamin C (ASCORBIC ACID) 500 MG tablet Take 500 mg by mouth daily.   No facility-administered encounter medications on file as of 01/17/2020.    ALLERGIES:  Allergies  Allergen Reactions  . Shrimp [Shellfish Allergy]      FAMILY HISTORY:  Family History  Problem Relation Age of Onset  . Cancer Mother        Colon cancer  . Cancer Brother        Prostate CA     SOCIAL HISTORY:    Social  Connections:   . Frequency of Communication with Friends and Family:   . Frequency of Social Gatherings with Friends and Family:   . Attends Religious Services:   . Active Member of Clubs or Organizations:   . Attends Archivist Meetings:   Marland Kitchen Marital Status:     REVIEW OF SYSTEMS:  Denies appetite changes, fevers, chills, fatigue, unexplained weight changes. Denies hearing loss, neck lumps or masses, mouth sores, ringing in ears or voice changes. Denies cough or wheezing.  Denies shortness of breath. Denies chest pain or palpitations. Denies leg swelling. Denies abdominal distention, pain, blood in stools, constipation, diarrhea, nausea, vomiting, or early satiety. Denies pain with intercourse, dysuria, frequency, hematuria or incontinence. Denies hot flashes, pelvic pain, vaginal bleeding or vaginal discharge.   Denies joint pain, back pain or muscle pain/cramps. Denies itching, rash, or wounds. Denies dizziness, headaches, numbness or seizures. Denies swollen lymph nodes or glands, denies easy bruising  or bleeding. Denies anxiety, depression, confusion, or decreased concentration.  Physical Exam:  Vital Signs for this encounter:  Blood pressure (!) 155/113, pulse 98, temperature 98 F (36.7 C), temperature source Temporal, weight 192 lb 6 oz (87.3 kg), SpO2 98 %. Body mass index is 31.05 kg/m. General: Alert, oriented, no acute distress.  HEENT: Normocephalic, atraumatic. Sclera anicteric.  Chest: Clear to auscultation bilaterally. No wheezes, rhonchi, or rales. Cardiovascular: Regular rate and rhythm, no murmurs, rubs, or gallops.  Abdomen: Obese. Normoactive bowel sounds. Soft, nondistended, nontender to palpation. No masses or hepatosplenomegaly appreciated. No palpable fluid wave. Laxity of abdominal wall.  Extremities: Grossly normal range of motion. Warm, well perfused. No edema bilaterally.  Skin: No rashes or lesions.  Lymphatics: No cervical, supraclavicular,  or inguinal adenopathy.  GU:  Normal external female genitalia. No discharge or bleeding.             Bladder/urethra:  No lesions or masses, well supported bladder             Vagina: mildly atrophic.             Cervix: Unable to appreciate on speculum exam.  On bimanual exam, cervix is significantly deviated anteriorly and is not palpable above the pubic symphysis.  It is difficult to tell if this is because there is a large cervical fibroid versus lower uterine segment fibroid significantly distorting the patient's anatomy.  Attempted to pass an endometrial Pipelle over my finger however could not find the cervical os.             Uterus: Significantly enlarged palpable almost up to the umbilicus, somewhat mobile, large lower uterine segment versus cervical fibroid.             Adnexa: No masses appreciated.  Rectal: No nodularity.  LABORATORY AND RADIOLOGIC DATA:  Outside medical records were reviewed to synthesize the above history, along with the history and physical obtained during the visit.   Lab Results  Component Value Date   WBC 7.6 01/16/2017   HGB 13.0 01/16/2017   HCT 38.4 01/16/2017   PLT 278 01/16/2017   GLUCOSE 107 01/16/2017   ALT 18 01/16/2017   AST 23 01/16/2017   NA 143 01/16/2017   K 4.0 01/16/2017   CL 100 05/21/2012   CREATININE 1.0 01/16/2017   BUN 17.3 01/16/2017   CO2 27 01/16/2017   12/16/19: pap - negative CEA 3.0 CA-125 32.6  11/09/19: pelvic ultrasound Uterus 8.8x6.5x9.2cm, fibroid uterus, endometrium not well visualized due to fibroids. Ovaries not visualized, no adnexal masses. No free fluid.  Pelvic MRI 2010: 1.  Diffuse uterine involvement by fibroids, with 19 cm pedunculated subserosal fibroid occupying the central pelvis and cul-de-sac which displaces the uterine corpus anteriorly, superiorly, and to the left. 2.  Normal ovaries.  Small amount of free fluid noted in pelvic cul- de-sac.

## 2020-01-13 ENCOUNTER — Encounter: Payer: Self-pay | Admitting: *Deleted

## 2020-01-17 ENCOUNTER — Inpatient Hospital Stay: Payer: Medicare HMO | Attending: Gynecologic Oncology | Admitting: Gynecologic Oncology

## 2020-01-17 ENCOUNTER — Other Ambulatory Visit: Payer: Self-pay

## 2020-01-17 ENCOUNTER — Encounter: Payer: Self-pay | Admitting: Gynecologic Oncology

## 2020-01-17 VITALS — BP 155/113 | HR 98 | Temp 98.0°F | Wt 192.4 lb

## 2020-01-17 DIAGNOSIS — E785 Hyperlipidemia, unspecified: Secondary | ICD-10-CM

## 2020-01-17 DIAGNOSIS — D259 Leiomyoma of uterus, unspecified: Secondary | ICD-10-CM | POA: Diagnosis not present

## 2020-01-17 DIAGNOSIS — Z800 Family history of malignant neoplasm of digestive organs: Secondary | ICD-10-CM | POA: Insufficient documentation

## 2020-01-17 DIAGNOSIS — Z8042 Family history of malignant neoplasm of prostate: Secondary | ICD-10-CM | POA: Diagnosis not present

## 2020-01-17 DIAGNOSIS — Z79899 Other long term (current) drug therapy: Secondary | ICD-10-CM

## 2020-01-17 DIAGNOSIS — E039 Hypothyroidism, unspecified: Secondary | ICD-10-CM | POA: Insufficient documentation

## 2020-01-17 DIAGNOSIS — N950 Postmenopausal bleeding: Secondary | ICD-10-CM | POA: Diagnosis not present

## 2020-01-17 DIAGNOSIS — Z853 Personal history of malignant neoplasm of breast: Secondary | ICD-10-CM | POA: Diagnosis not present

## 2020-01-17 DIAGNOSIS — D219 Benign neoplasm of connective and other soft tissue, unspecified: Secondary | ICD-10-CM | POA: Insufficient documentation

## 2020-01-17 NOTE — Patient Instructions (Signed)
I would like to plan a repeat ultrasound in 4-6 months.  Please call if you have any vaginal bleeding, spotting or discharge prior to your next ultrasound.  You can reach the clinic at [phone].

## 2020-02-29 ENCOUNTER — Ambulatory Visit: Payer: Medicare HMO | Admitting: Gynecologic Oncology

## 2020-04-26 ENCOUNTER — Ambulatory Visit (HOSPITAL_COMMUNITY): Payer: Medicare HMO

## 2020-04-27 ENCOUNTER — Telehealth: Payer: Self-pay | Admitting: Gynecologic Oncology

## 2020-04-27 ENCOUNTER — Ambulatory Visit (HOSPITAL_COMMUNITY)
Admission: RE | Admit: 2020-04-27 | Discharge: 2020-04-27 | Disposition: A | Discharge status: Home / Self Care | Payer: Medicare HMO | Source: Ambulatory Visit | Attending: Gynecologic Oncology | Admitting: Gynecologic Oncology

## 2020-04-27 ENCOUNTER — Other Ambulatory Visit: Payer: Self-pay

## 2020-04-27 DIAGNOSIS — D219 Benign neoplasm of connective and other soft tissue, unspecified: Secondary | ICD-10-CM | POA: Diagnosis present

## 2020-04-27 DIAGNOSIS — N950 Postmenopausal bleeding: Secondary | ICD-10-CM

## 2020-04-27 NOTE — Telephone Encounter (Signed)
Left messge to verify phone visit for pre reg

## 2020-04-28 ENCOUNTER — Encounter: Payer: Self-pay | Admitting: Gynecologic Oncology

## 2020-04-28 ENCOUNTER — Inpatient Hospital Stay: Payer: Medicare HMO | Attending: Gynecologic Oncology | Admitting: Gynecologic Oncology

## 2020-04-28 DIAGNOSIS — D219 Benign neoplasm of connective and other soft tissue, unspecified: Secondary | ICD-10-CM | POA: Diagnosis not present

## 2020-04-28 NOTE — Progress Notes (Signed)
Gynecologic Oncology Telehealth Consult Note: Gyn-Onc  I connected with Brandi Bowen on 04/28/20 at  3:30 PM EDT by telephone and verified that I am speaking with the correct person using two identifiers.  I discussed the limitations, risks, security and privacy concerns of performing an evaluation and management service by telemedicine and the availability of in-person appointments. I also discussed with the patient that there may be a patient responsible charge related to this service. The patient expressed understanding and agreed to proceed.  Other persons participating in the visit and their role in the encounter: none.  Patient's location: home Provider's location: [facility]  Reason for Visit: discuss ultrasound results, treatment planning  Interval History: Overall, Brandi Bowen has done well since I saw her in March.  She denies any further vaginal bleeding or discharge.  She denies any abdominal or pelvic pain or cramping.  She reports a good appetite without nausea or emesis.  She denies early satiety.  She reports bowel and bladder function are at their baseline.  Past Medical/Surgical History: Past Medical History:  Diagnosis Date  . Breast cancer ([place])   . Cancer ([place])    rt breast ca  . Hydronephrosis   . Hyperlipidemia   . Hypothyroidism   . Uterine leiomyoma   . Vitamin D deficiency 02/25/2012    Past Surgical History:  Procedure Laterality Date  . MASTECTOMY      Family History  Problem Relation Age of Onset  . Cancer Mother        Colon cancer  . Cancer Brother        Prostate CA    Social History   Socioeconomic History  . Marital status: Widowed    Spouse name: Not on file  . Number of children: Not on file  . Years of education: Not on file  . Highest education level: Not on file  Occupational History  . Not on file  Tobacco Use  . Smoking status: Never Smoker  . Smokeless tobacco: Never Used  Vaping Use  . Vaping Use: Never used  Substance and  Sexual Activity  . Alcohol use: No    Comment: Rare glass of wine  . Drug use: No  . Sexual activity: Yes    Birth control/protection: Post-menopausal  Other Topics Concern  . Not on file  Social History Narrative  . Not on file   Social Determinants of Health   Financial Resource Strain:   . Difficulty of Paying Living Expenses:   Food Insecurity:   . Worried About Charity fundraiser in the Last Year:   . Arboriculturist in the Last Year:   Transportation Needs:   . Film/video editor (Medical):   Marland Kitchen Lack of Transportation (Non-Medical):   Physical Activity:   . Days of Exercise per Week:   . Minutes of Exercise per Session:   Stress:   . Feeling of Stress :   Social Connections:   . Frequency of Communication with Friends and Family:   . Frequency of Social Gatherings with Friends and Family:   . Attends Religious Services:   . Active Member of Clubs or Organizations:   . Attends Archivist Meetings:   Marland Kitchen Marital Status:     Current Medications:  Current Outpatient Medications:  .  Cholecalciferol ([employer] VIT D 5000 HIGH-POTENCY) 5000 UNITS capsule, Take 1 capsule (5,000 Units total) by mouth daily., Disp: 90 capsule, Rfl: 4 .  Multiple Vitamin (MULTIVITAMIN) capsule, Take 1  capsule by mouth daily., Disp: 90 capsule, Rfl: 4 .  Omega-3 Fatty Acids (FISH OIL) 1000 MG CAPS, Take by mouth., Disp: , Rfl:  .  Probiotic Product (PROBIOTIC DAILY PO), Take by mouth., Disp: , Rfl:  .  Specialty Vitamins Products (ONE-A-DAY BONE STRENGTH PO), Take 1 tablet by mouth 3 (three) times daily., Disp: , Rfl:  .  UNABLE TO FIND, Milk thistle Tumeric, Disp: , Rfl:  .  vitamin C (ASCORBIC ACID) 500 MG tablet, Take 500 mg by mouth daily., Disp: , Rfl:   Review of Symptoms: Review of systems is negative.  Physical Exam: There were no vitals taken for this visit. Not performed given limitations of phone visit.  Laboratory & Radiologic Studies: Pelvic ultrasound on  6/24: IMPRESSION: Unable to delineate uterus, endometrial complex and ovaries.  Large soft tissue mass in pelvis up to 17.8 cm diameter, favor representing an enlarged leiomyomatous uterus, less likely pelvic mass of other etiology such as ovarian or non gynecologic.  Additional 5.2 cm cystic structure LEFT adnexa.  Further evaluation of the pelvis by MR imaging with and without contrast recommended to assess.  Assessment & Plan: Brandi Bowen is a 79 y.o. woman with a large asymptomatic fibroid uterus who presented with postmenopausal bleeding approximately 9 months ago, 1 episode, with no further bleeding since.  Reviewed ultrasound findings from yesterday which show an enlarged fibroid versus enlarged fibroid uterus.  The uterus itself and endometrium are difficult to characterize given their size.  Overall, I suspect there has been minimal change since her last imaging.  Patient has been completely asymptomatic.  We discussed again the difficulty with surgery given the large size of her uterus.  Patient continues to want to avoid surgery.  I think this is very reasonable.  I would like to repeat imaging in 4-6 months.  We will get an ultrasound as well as a pelvic MRI.  The patient knows to call if she develops any's symptoms such as recurrent bleeding between now and her scheduled appointment with me.  I discussed the assessment and treatment plan with the patient. The patient was provided with an opportunity to ask questions and all were answered. The patient agreed with the plan and demonstrated an understanding of the instructions.   The patient was advised to call back or see an in-person evaluation if the symptoms worsen or if the condition fails to improve as anticipated.   20 minutes of total time was spent for this patient encounter, including preparation, face-to-face counseling with the patient and coordination of care, and documentation of the encounter.   Jeral Pinch, MD  Division of Gynecologic Oncology  Department of Obstetrics and Gynecology  [facility]

## 2020-05-02 ENCOUNTER — Telehealth: Payer: Self-pay | Admitting: *Deleted

## 2020-05-02 ENCOUNTER — Encounter: Payer: Self-pay | Admitting: *Deleted

## 2020-05-02 NOTE — Telephone Encounter (Signed)
Called and scheduled the patient for an [place] and MRI scan on 12/14. Scheduled that patient for a follow phone visit on 12/15. Called and gave the patient the dates and times of the above appts

## 2020-05-15 NOTE — Progress Notes (Signed)
error 

## 2020-05-22 ENCOUNTER — Telehealth: Payer: Self-pay

## 2020-05-22 NOTE — Telephone Encounter (Signed)
Pt requested her records to be sent to her cardiologist/Michael Duran PA-C. She had an appointment there today and the records were not in her chart from [name].  Apologized to Ms Mulhall for the over site.  Told her the office notes and [place] will be faxed to Mr. Duran. Office Notes faxed to Mr. Duran PA-C. Confirmation of fax received.

## 2020-07-26 ENCOUNTER — Other Ambulatory Visit: Payer: Self-pay | Admitting: Obstetrics and Gynecology

## 2020-07-26 DIAGNOSIS — D259 Leiomyoma of uterus, unspecified: Secondary | ICD-10-CM

## 2020-08-04 ENCOUNTER — Ambulatory Visit
Admission: RE | Admit: 2020-08-04 | Discharge: 2020-08-04 | Disposition: A | Discharge status: Home / Self Care | Payer: Medicare HMO | Source: Ambulatory Visit | Attending: Obstetrics and Gynecology | Admitting: Obstetrics and Gynecology

## 2020-08-04 ENCOUNTER — Other Ambulatory Visit: Payer: Self-pay | Admitting: Obstetrics and Gynecology

## 2020-08-04 DIAGNOSIS — D259 Leiomyoma of uterus, unspecified: Secondary | ICD-10-CM

## 2020-08-04 MED ORDER — GADOBENATE DIMEGLUMINE 529 MG/ML IV SOLN: 17.0000 mL | Freq: Once | INTRAVENOUS | Status: DC | PRN

## 2020-08-11 ENCOUNTER — Emergency Department (HOSPITAL_COMMUNITY): Payer: Medicare HMO

## 2020-08-11 ENCOUNTER — Emergency Department (HOSPITAL_BASED_OUTPATIENT_CLINIC_OR_DEPARTMENT_OTHER): Payer: Medicare HMO

## 2020-08-11 ENCOUNTER — Other Ambulatory Visit: Payer: Self-pay

## 2020-08-11 ENCOUNTER — Telehealth: Payer: Self-pay | Admitting: *Deleted

## 2020-08-11 ENCOUNTER — Encounter (HOSPITAL_COMMUNITY): Payer: Self-pay | Admitting: Emergency Medicine

## 2020-08-11 ENCOUNTER — Inpatient Hospital Stay (HOSPITAL_COMMUNITY)
Admission: EM | Admit: 2020-08-11 | Discharge: 2020-08-24 | DRG: 374 | Disposition: A | Discharge status: Hospice | Payer: Medicare HMO | Source: Ambulatory Visit | Attending: Internal Medicine | Admitting: Internal Medicine

## 2020-08-11 DIAGNOSIS — Z20822 Contact with and (suspected) exposure to covid-19: Secondary | ICD-10-CM | POA: Diagnosis present

## 2020-08-11 DIAGNOSIS — R180 Malignant ascites: Secondary | ICD-10-CM

## 2020-08-11 DIAGNOSIS — Z515 Encounter for palliative care: Secondary | ICD-10-CM

## 2020-08-11 DIAGNOSIS — Z800 Family history of malignant neoplasm of digestive organs: Secondary | ICD-10-CM

## 2020-08-11 DIAGNOSIS — I82452 Acute embolism and thrombosis of left peroneal vein: Secondary | ICD-10-CM | POA: Diagnosis present

## 2020-08-11 DIAGNOSIS — C269 Malignant neoplasm of ill-defined sites within the digestive system: Secondary | ICD-10-CM | POA: Diagnosis present

## 2020-08-11 DIAGNOSIS — C786 Secondary malignant neoplasm of retroperitoneum and peritoneum: Secondary | ICD-10-CM

## 2020-08-11 DIAGNOSIS — R112 Nausea with vomiting, unspecified: Secondary | ICD-10-CM

## 2020-08-11 DIAGNOSIS — J90 Pleural effusion, not elsewhere classified: Secondary | ICD-10-CM

## 2020-08-11 DIAGNOSIS — C799 Secondary malignant neoplasm of unspecified site: Secondary | ICD-10-CM

## 2020-08-11 DIAGNOSIS — E872 Acidosis: Secondary | ICD-10-CM | POA: Diagnosis present

## 2020-08-11 DIAGNOSIS — Z66 Do not resuscitate: Secondary | ICD-10-CM | POA: Diagnosis not present

## 2020-08-11 DIAGNOSIS — E43 Unspecified severe protein-calorie malnutrition: Secondary | ICD-10-CM | POA: Diagnosis present

## 2020-08-11 DIAGNOSIS — Z91013 Allergy to seafood: Secondary | ICD-10-CM

## 2020-08-11 DIAGNOSIS — D72829 Elevated white blood cell count, unspecified: Secondary | ICD-10-CM | POA: Diagnosis present

## 2020-08-11 DIAGNOSIS — Z8042 Family history of malignant neoplasm of prostate: Secondary | ICD-10-CM

## 2020-08-11 DIAGNOSIS — Z86718 Personal history of other venous thrombosis and embolism: Secondary | ICD-10-CM

## 2020-08-11 DIAGNOSIS — R188 Other ascites: Secondary | ICD-10-CM | POA: Diagnosis present

## 2020-08-11 DIAGNOSIS — R1011 Right upper quadrant pain: Secondary | ICD-10-CM | POA: Diagnosis not present

## 2020-08-11 DIAGNOSIS — I82402 Acute embolism and thrombosis of unspecified deep veins of left lower extremity: Secondary | ICD-10-CM | POA: Diagnosis present

## 2020-08-11 DIAGNOSIS — I2699 Other pulmonary embolism without acute cor pulmonale: Secondary | ICD-10-CM | POA: Diagnosis not present

## 2020-08-11 DIAGNOSIS — D219 Benign neoplasm of connective and other soft tissue, unspecified: Secondary | ICD-10-CM | POA: Diagnosis present

## 2020-08-11 DIAGNOSIS — D259 Leiomyoma of uterus, unspecified: Secondary | ICD-10-CM | POA: Diagnosis present

## 2020-08-11 DIAGNOSIS — M7989 Other specified soft tissue disorders: Secondary | ICD-10-CM

## 2020-08-11 DIAGNOSIS — J910 Malignant pleural effusion: Secondary | ICD-10-CM | POA: Diagnosis present

## 2020-08-11 DIAGNOSIS — E785 Hyperlipidemia, unspecified: Secondary | ICD-10-CM | POA: Diagnosis present

## 2020-08-11 DIAGNOSIS — E039 Hypothyroidism, unspecified: Secondary | ICD-10-CM | POA: Diagnosis present

## 2020-08-11 DIAGNOSIS — Z9221 Personal history of antineoplastic chemotherapy: Secondary | ICD-10-CM

## 2020-08-11 DIAGNOSIS — K5900 Constipation, unspecified: Secondary | ICD-10-CM

## 2020-08-11 DIAGNOSIS — N179 Acute kidney failure, unspecified: Secondary | ICD-10-CM | POA: Diagnosis not present

## 2020-08-11 DIAGNOSIS — Z86711 Personal history of pulmonary embolism: Secondary | ICD-10-CM

## 2020-08-11 DIAGNOSIS — E559 Vitamin D deficiency, unspecified: Secondary | ICD-10-CM | POA: Diagnosis present

## 2020-08-11 DIAGNOSIS — Z6829 Body mass index (BMI) 29.0-29.9, adult: Secondary | ICD-10-CM

## 2020-08-11 DIAGNOSIS — R5381 Other malaise: Secondary | ICD-10-CM

## 2020-08-11 DIAGNOSIS — Z853 Personal history of malignant neoplasm of breast: Secondary | ICD-10-CM

## 2020-08-11 DIAGNOSIS — Z9011 Acquired absence of right breast and nipple: Secondary | ICD-10-CM

## 2020-08-11 DIAGNOSIS — Z923 Personal history of irradiation: Secondary | ICD-10-CM

## 2020-08-11 DIAGNOSIS — I10 Essential (primary) hypertension: Secondary | ICD-10-CM | POA: Diagnosis present

## 2020-08-11 DIAGNOSIS — K567 Ileus, unspecified: Secondary | ICD-10-CM | POA: Diagnosis not present

## 2020-08-11 LAB — URINALYSIS, ROUTINE W REFLEX MICROSCOPIC
Bacteria, UA: NONE SEEN
Bilirubin Urine: NEGATIVE
Glucose, UA: NEGATIVE mg/dL
Hgb urine dipstick: NEGATIVE
Ketones, ur: NEGATIVE mg/dL
Leukocytes,Ua: NEGATIVE
Nitrite: NEGATIVE
Protein, ur: 100 mg/dL — AB
Specific Gravity, Urine: 1.027 (ref 1.005–1.030)
pH: 5 (ref 5.0–8.0)

## 2020-08-11 LAB — CBC
HCT: 42.3 % (ref 36.0–46.0)
Hemoglobin: 13.4 g/dL (ref 12.0–15.0)
MCH: 26.5 pg (ref 26.0–34.0)
MCHC: 31.7 g/dL (ref 30.0–36.0)
MCV: 83.6 fL (ref 80.0–100.0)
Platelets: 520 10*3/uL — ABNORMAL HIGH (ref 150–400)
RBC: 5.06 MIL/uL (ref 3.87–5.11)
RDW: 13.8 % (ref 11.5–15.5)
WBC: 17.3 10*3/uL — ABNORMAL HIGH (ref 4.0–10.5)
nRBC: 0 % (ref 0.0–0.2)

## 2020-08-11 LAB — COMPREHENSIVE METABOLIC PANEL
ALT: 14 U/L (ref 0–44)
AST: 21 U/L (ref 15–41)
Albumin: 2.8 g/dL — ABNORMAL LOW (ref 3.5–5.0)
Alkaline Phosphatase: 138 U/L — ABNORMAL HIGH (ref 38–126)
Anion gap: 17 — ABNORMAL HIGH (ref 5–15)
BUN: 26 mg/dL — ABNORMAL HIGH (ref 8–23)
CO2: 21 mmol/L — ABNORMAL LOW (ref 22–32)
Calcium: 9.1 mg/dL (ref 8.9–10.3)
Chloride: 100 mmol/L (ref 98–111)
Creatinine, Ser: 0.97 mg/dL (ref 0.44–1.00)
GFR, Estimated: 55 mL/min — ABNORMAL LOW (ref >60)
Glucose, Bld: 151 mg/dL — ABNORMAL HIGH (ref 70–99)
Potassium: 4.4 mmol/L (ref 3.5–5.1)
Sodium: 138 mmol/L (ref 135–145)
Total Bilirubin: 0.5 mg/dL (ref 0.3–1.2)
Total Protein: 6.5 g/dL (ref 6.5–8.1)

## 2020-08-11 LAB — RESPIRATORY PANEL BY RT PCR (FLU A&B, COVID)
Influenza A by PCR: NEGATIVE
Influenza B by PCR: NEGATIVE
SARS Coronavirus 2 by RT PCR: NEGATIVE

## 2020-08-11 LAB — LIPASE, BLOOD: Lipase: 25 U/L (ref 11–51)

## 2020-08-11 LAB — PROTIME-INR
INR: 1.3 — ABNORMAL HIGH (ref 0.8–1.2)
Prothrombin Time: 15.7 seconds — ABNORMAL HIGH (ref 11.4–15.2)

## 2020-08-11 LAB — APTT: aPTT: 30 seconds (ref 24–36)

## 2020-08-11 MED ORDER — MORPHINE SULFATE (PF) 2 MG/ML IV SOLN
2.0000 mg | Freq: Once | INTRAVENOUS | Status: AC
  Administered 2020-08-11: 2 mg via INTRAVENOUS
  Filled 2020-08-11: qty 1

## 2020-08-11 MED ORDER — IOHEXOL 300 MG/ML  SOLN
100.0000 mL | Freq: Once | INTRAMUSCULAR | Status: AC | PRN
  Administered 2020-08-11: 100 mL via INTRAVENOUS

## 2020-08-11 MED ORDER — HEPARIN BOLUS VIA INFUSION
2000.0000 [IU] | INTRAVENOUS | Status: AC
  Administered 2020-08-11: 2000 [IU] via INTRAVENOUS
  Filled 2020-08-11: qty 2000

## 2020-08-11 MED ORDER — ATENOLOL 50 MG PO TABS
50.0000 mg | ORAL_TABLET | Freq: Every day | ORAL | Status: DC
  Administered 2020-08-11 – 2020-08-18 (×8): 50 mg via ORAL
  Filled 2020-08-11 (×8): qty 1

## 2020-08-11 MED ORDER — ONDANSETRON HCL 4 MG/2ML IJ SOLN
4.0000 mg | Freq: Once | INTRAMUSCULAR | Status: AC
  Administered 2020-08-11: 4 mg via INTRAVENOUS
  Filled 2020-08-11: qty 2

## 2020-08-11 MED ORDER — SODIUM CHLORIDE (PF) 0.9 % IJ SOLN
INTRAMUSCULAR | Status: AC
  Filled 2020-08-11: qty 50

## 2020-08-11 MED ORDER — HEPARIN (PORCINE) 25000 UT/250ML-% IV SOLN
1150.0000 [IU]/h | INTRAVENOUS | Status: AC
  Administered 2020-08-11: 1150 [IU]/h via INTRAVENOUS
  Filled 2020-08-11 (×2): qty 250

## 2020-08-11 NOTE — Progress Notes (Signed)
Lower extremity venous has been completed.   Preliminary results in CV Proc.   Brandi Bowen 08/11/2020 5:29 PM

## 2020-08-11 NOTE — ED Notes (Signed)
Bladder scan showed 50mL twice.

## 2020-08-11 NOTE — H&P (Signed)
History and Physical    Brandi Bowen CHE:[id number] DOB: [birth year] DOA: 08/11/2020  PCP: [name], PA-C  Patient coming from: Home.  Chief Complaint: Abdominal distention.  HPI: Brandi Bowen is a 79 y.o. female with history of breast cancer in remission has been recently experiencing abdominal distention which has progressed over the last 2 weeks.  Patient has been followed by GYN oncology [name] for post menopausal bleeding previously.  Recently patient had followed up with gynecologist and had MRI of the pelvis which showed large fibroids with ascites and was advised to follow with [name].  Since patient's abdominal girth got worse patient presents to the ER.  Patient also noted increasing swelling in the left lower extremity.  ED Course: In the ER patient had a CT abdomen pelvis which shows features concerning for peritoneal carcinomatosis with significant ascites and bilateral pleural effusion.  There is also concern for endometrial malignancy.  Dopplers of the lower extremity showed left peroneal DVT.  For which patient was started on heparin.  Lab work show WBC count of 17.3 albumin 2.8 glucose 151.  Covid test was negative.  Review of Systems: As per HPI, rest all negative.   Past Medical History:  Diagnosis Date  . Breast cancer ([place])   . Cancer ([place])    rt breast ca  . Hydronephrosis   . Hyperlipidemia   . Hypothyroidism   . Uterine leiomyoma   . Vitamin D deficiency 02/25/2012    Past Surgical History:  Procedure Laterality Date  . MASTECTOMY       reports that she has never smoked. She has never used smokeless tobacco. She reports that she does not drink alcohol and does not use drugs.  Allergies  Allergen Reactions  . Shrimp [Shellfish Allergy]     Family History  Problem Relation Age of Onset  . Cancer Mother        Colon cancer  . Cancer Brother        Prostate CA    Prior to Admission medications   Medication Sig Start Date End  Date Taking? Authorizing Provider  atenolol (TENORMIN) 50 MG tablet Take 50 mg by mouth daily.   Yes [provider]  Cholecalciferol ([employer] VIT D 5000 HIGH-POTENCY) 5000 UNITS capsule Take 1 capsule (5,000 Units total) by mouth daily. 07/18/14  Yes Magrinat, Virgie Dad, MD  Meclizine HCl (BONINE) 25 MG CHEW Chew 25 tablets by mouth daily as needed (nausea).   Yes [provider]  Multiple Vitamin (MULTIVITAMIN) capsule Take 1 capsule by mouth daily. 07/18/14  Yes Magrinat, Virgie Dad, MD  Omega-3 Fatty Acids (FISH OIL) 1000 MG CAPS Take 1 capsule by mouth daily.    Yes [provider]  Probiotic Product (PROBIOTIC DAILY PO) Take 1 capsule by mouth daily.    Yes [provider]  Specialty Vitamins Products (ONE-A-DAY BONE STRENGTH PO) Take 1 tablet by mouth in the morning and at bedtime.    Yes [provider]  vitamin C (ASCORBIC ACID) 500 MG tablet Take 500 mg by mouth 2 (two) times daily.    Yes [provider]    Physical Exam: Constitutional: Moderately built and nourished. Vitals:   08/11/20 1800 08/11/20 1833 08/11/20 1935 08/11/20 2026  BP: (!) 132/96  125/90 (!) 127/98  Pulse: 89  87 85  Resp: 17  18 20   Temp:      TempSrc:      SpO2: 96%  93% 94%  Weight:  81.6 kg    Height:  5\' 5"  (1.651 m)     Eyes: Anicteric no pallor. ENMT: No discharge from the ears eyes nose or mouth. Neck: No mass felt.  No neck rigidity. Respiratory: No rhonchi or crepitations. Cardiovascular: S1-S2 heard. Abdomen: Distended nontender bowel sounds present. Musculoskeletal: Both lower extremities are cold to touch more on the right side but pulses are felt.  Edema on the left side. Skin: No rash. Neurologic: Alert awake oriented to time place and person.  Moves all extremities. Psychiatric: Appears normal.  Normal affect.   Labs on Admission: I have personally reviewed following labs and imaging studies  CBC: Recent Labs  Lab 08/11/20 1422  WBC  17.3*  HGB 13.4  HCT 42.3  MCV 83.6  PLT 161*   Basic Metabolic Panel: Recent Labs  Lab 08/11/20 1422  NA 138  K 4.4  CL 100  CO2 21*  GLUCOSE 151*  BUN 26*  CREATININE 0.97  CALCIUM 9.1   GFR: Estimated Creatinine Clearance: 48.8 mL/min (by C-G formula based on SCr of 0.97 mg/dL). Liver Function Tests: Recent Labs  Lab 08/11/20 1422  AST 21  ALT 14  ALKPHOS 138*  BILITOT 0.5  PROT 6.5  ALBUMIN 2.8*   Recent Labs  Lab 08/11/20 1422  LIPASE 25   No results for input(s): AMMONIA in the last 168 hours. Coagulation Profile: Recent Labs  Lab 08/11/20 1835  INR 1.3*   Cardiac Enzymes: No results for input(s): CKTOTAL, CKMB, CKMBINDEX, TROPONINI in the last 168 hours. BNP (last 3 results) No results for input(s): PROBNP in the last 8760 hours. HbA1C: No results for input(s): HGBA1C in the last 72 hours. CBG: No results for input(s): GLUCAP in the last 168 hours. Lipid Profile: No results for input(s): CHOL, HDL, LDLCALC, TRIG, CHOLHDL, LDLDIRECT in the last 72 hours. Thyroid Function Tests: No results for input(s): TSH, T4TOTAL, FREET4, T3FREE, THYROIDAB in the last 72 hours. Anemia Panel: No results for input(s): VITAMINB12, FOLATE, FERRITIN, TIBC, IRON, RETICCTPCT in the last 72 hours. Urine analysis:    Component Value Date/Time   COLORURINE AMBER (A) 08/11/2020 1422   APPEARANCEUR HAZY (A) 08/11/2020 1422   LABSPEC 1.027 08/11/2020 1422   PHURINE 5.0 08/11/2020 1422   GLUCOSEU NEGATIVE 08/11/2020 1422   HGBUR NEGATIVE 08/11/2020 [place] 08/11/2020 1422   KETONESUR NEGATIVE 08/11/2020 1422   PROTEINUR 100 (A) 08/11/2020 1422   NITRITE NEGATIVE 08/11/2020 1422   LEUKOCYTESUR NEGATIVE 08/11/2020 1422   Sepsis Labs: @LABRCNTIP (procalcitonin:4,lacticidven:4) ) Recent Results (from the past 240 hour(s))  Respiratory Panel by RT PCR (Flu A&B, Covid) - Nasopharyngeal Swab     Status: None   Collection Time: 08/11/20  6:40 PM    Specimen: Nasopharyngeal Swab  Result Value Ref Range Status   SARS Coronavirus 2 by RT PCR NEGATIVE NEGATIVE Final    Comment: (NOTE) SARS-CoV-2 target nucleic acids are NOT DETECTED.  The SARS-CoV-2 RNA is generally detectable in upper respiratoy specimens during the acute phase of infection. The lowest concentration of SARS-CoV-2 viral copies this assay can detect is 131 copies/mL. A negative result does not preclude SARS-Cov-2 infection and should not be used as the sole basis for treatment or other patient management decisions. A negative result may occur with  improper specimen collection/handling, submission of specimen other than nasopharyngeal swab, presence of viral mutation(s) within the areas targeted by this assay, and inadequate number of viral copies (<131 copies/mL). A negative result must be  combined with clinical observations, patient history, and epidemiological information. The expected result is Negative.  Fact Sheet for Patients:  PinkCheek.be  Fact Sheet for Healthcare Providers:  GravelBags.it  This test is no t yet approved or cleared by the [place] FDA and  has been authorized for detection and/or diagnosis of SARS-CoV-2 by FDA under an Emergency Use Authorization (EUA). This EUA will remain  in effect (meaning this test can be used) for the duration of the COVID-19 declaration under Section 564(b)(1) of the Act, 21 U.S.C. section 360bbb-3(b)(1), unless the authorization is terminated or revoked sooner.     Influenza A by PCR NEGATIVE NEGATIVE Final   Influenza B by PCR NEGATIVE NEGATIVE Final    Comment: (NOTE) The Xpert Xpress SARS-CoV-2/FLU/RSV assay is intended as an aid in  the diagnosis of influenza from Nasopharyngeal swab specimens and  should not be used as a sole basis for treatment. Nasal washings and  aspirates are unacceptable for Xpert Xpress SARS-CoV-2/FLU/RSV   testing.  Fact Sheet for Patients: PinkCheek.be  Fact Sheet for Healthcare Providers: GravelBags.it  This test is not yet approved or cleared by the [place] FDA and  has been authorized for detection and/or diagnosis of SARS-CoV-2 by  FDA under an Emergency Use Authorization (EUA). This EUA will remain  in effect (meaning this test can be used) for the duration of the  Covid-19 declaration under Section 564(b)(1) of the Act, 21  U.S.C. section 360bbb-3(b)(1), unless the authorization is  terminated or revoked. Performed at [facility], [place] [address], [place], [place] [address]      Radiological Exams on Admission: DG Chest 2 View  Result Date: 08/11/2020 CLINICAL DATA:  Shortness of breath. Abdominal distention. Ascites. EXAM: CHEST - 2 VIEW COMPARISON:  None. FINDINGS: Moderate left pleural effusion and small right pleural effusion are seen. Atelectasis or consolidation is seen in the left lung base. Upper lung fields are clear. Heart size is within normal limits. IMPRESSION: Moderate left and small right pleural effusions. Left basilar atelectasis versus consolidation. Electronically Signed   By: Marlaine Hind M.D.   On: 08/11/2020 17:17   CT Abdomen Pelvis W Contrast  Result Date: 08/11/2020 CLINICAL DATA:  Abdominal distension. Ascites. Fibroids. Personal history of breast carcinoma. EXAM: CT ABDOMEN AND PELVIS WITH CONTRAST TECHNIQUE: Multidetector CT imaging of the abdomen and pelvis was performed using the standard protocol following bolus administration of intravenous contrast. CONTRAST:  135mL OMNIPAQUE IOHEXOL 300 MG/ML  SOLN COMPARISON:  Pelvis MRI on 08/04/2020 FINDINGS: Lower Chest: Moderate left and small right pleural effusions, with left greater than right lower lobe atelectasis. Previous right mastectomy. Hepatobiliary: No hepatic masses identified. Gallbladder is unremarkable. No evidence  of biliary ductal dilatation. Pancreas:  No mass or inflammatory changes. Spleen: Within normal limits in size and appearance. Adrenals/Urinary Tract: No masses identified. 5 cm left renal cyst noted. No evidence of ureteral calculi or hydronephrosis. Stomach/Bowel: Moderate hiatal hernia is seen. No evidence of obstruction, inflammatory process or abnormal fluid collections. Vascular/Lymphatic: No pathologically enlarged lymph nodes. No abdominal aortic aneurysm. Aortic atherosclerotic calcification noted. Reproductive: Markedly enlarged uterus is seen due to multiple partially calcified uterine fibroids. The largest fibroid is subserosal in location in the posterior uterine corpus measuring 15.6 cm. Thickening of the endometrium is seen in the fundal region uterus which measures up to 15 mm. Endometrial carcinoma cannot be excluded in a postmenopausal female. No separate adnexal masses are identified. Other: A large amount of ascites is seen. Diffuse  mesenteric soft tissue stranding is seen as well as omental caking. Mild peritoneal thickening and enhancement is also seen in the lower abdomen pelvis. These findings are highly suspicious for peritoneal carcinomatosis. Musculoskeletal:  No suspicious bone lesions identified. IMPRESSION: Large amount of ascites, and diffuse mesenteric soft tissue stranding and omental caking, highly suspicious for peritoneal carcinomatosis. Markedly enlarged uterus due to multiple fibroids measuring up to 15 cm. Thickening of the endometrium in the fundal region up to 15 mm. Endometrial carcinoma cannot be excluded in a postmenopausal female. Moderate hiatal hernia. Moderate left and small right pleural effusions, with bilateral lower lobe atelectasis. Electronically Signed   By: Marlaine Hind M.D.   On: 08/11/2020 17:14   VAS [place] LOWER EXTREMITY VENOUS (DVT) (ONLY [facility] & [facility] 7a-7p)  Result Date: 08/11/2020  Lower Venous DVTStudy Indications: Swelling.  Comparison Study: no prior  Performing Technologist: Abram Sander RVS  Examination Guidelines: A complete evaluation includes B-mode imaging, spectral Doppler, color Doppler, and power Doppler as needed of all accessible portions of each vessel. Bilateral testing is considered an integral part of a complete examination. Limited examinations for reoccurring indications may be performed as noted. The reflux portion of the exam is performed with the patient in reverse Trendelenburg.  +-----+---------------+---------+-----------+----------+--------------+ RIGHTCompressibilityPhasicitySpontaneityPropertiesThrombus Aging +-----+---------------+---------+-----------+----------+--------------+ CFV  Full           Yes      Yes                                 +-----+---------------+---------+-----------+----------+--------------+   +---------+---------------+---------+-----------+----------+--------------+ LEFT     CompressibilityPhasicitySpontaneityPropertiesThrombus Aging +---------+---------------+---------+-----------+----------+--------------+ CFV      Full           Yes      Yes                                 +---------+---------------+---------+-----------+----------+--------------+ SFJ      Full                                                        +---------+---------------+---------+-----------+----------+--------------+ FV Prox  Full                                                        +---------+---------------+---------+-----------+----------+--------------+ FV Mid   Full                                                        +---------+---------------+---------+-----------+----------+--------------+ FV DistalFull                                                        +---------+---------------+---------+-----------+----------+--------------+ PFV      Full                                                         +---------+---------------+---------+-----------+----------+--------------+  POP      Full           Yes      Yes                                 +---------+---------------+---------+-----------+----------+--------------+ PTV      Full                                                        +---------+---------------+---------+-----------+----------+--------------+ PERO     None           No       No                   Acute          +---------+---------------+---------+-----------+----------+--------------+     Summary: RIGHT: - No evidence of common femoral vein obstruction.  LEFT: - Findings consistent with acute deep vein thrombosis involving the left peroneal veins. - No cystic structure found in the popliteal fossa.  *See table(s) above for measurements and observations. Electronically signed by Servando Snare MD on 08/11/2020 at 5:30:50 PM.    Final     EKG: Independently reviewed.  Normal sinus rhythm.  Assessment/Plan Principal Problem:   Ascites Active Problems:   Acute deep vein thrombosis (DVT) of left lower extremity (HCC)    1. Ascites with CT scan showing features concerning for peritoneal carcinomatosis and possible endometrial carcinoma and uterine fibroids will need paracentesis for symptom relief and also will check for cytology and will need input from patient's GYN oncologist [name].  May also discuss with patient's oncology Dr. Jana Hakim. 2. DVT of the left lower extremity likely precipitated by malignancy.  On heparin for now. 3. Hypertension on atenolol. 4. Bilateral pleural effusion likely from third spacing from possible malignancy and also low albumin levels. 5. Protein calorie malnutrition.   DVT prophylaxis: Heparin infusion. Code Status: Full code. Family Communication: Patient's son at the bedside. Disposition Plan: Home. Consults called: None. Admission status: Observation.   Rise Patience MD [facility] Pager [phone].  If 7PM-7AM, please contact night-coverage [email or web address] Password [email or web address]  08/11/2020, 9:18 PM

## 2020-08-11 NOTE — Progress Notes (Signed)
ANTICOAGULATION CONSULT NOTE - Initial Consult  Pharmacy Consult for IV heparin  Indication: DVT  Allergies  Allergen Reactions  . Shrimp [Shellfish Allergy]     Patient Measurements: Height: 5\' 5"  (165.1 cm) Weight: 81.6 kg (180 lb) IBW/kg (Calculated) : 57 Heparin Dosing Weight: 74.4 kg  Vital Signs: Temp: 98.1 F (36.7 C) (10/08 1349) Temp Source: Oral (10/08 1349) BP: 128/89 (10/08 1722) Pulse Rate: 91 (10/08 1722)  Labs: Recent Labs    08/11/20 1422  HGB 13.4  HCT 42.3  PLT 520*  CREATININE 0.97    Estimated Creatinine Clearance: 48.8 mL/min (by C-G formula based on SCr of 0.97 mg/dL).   Medical History: Past Medical History:  Diagnosis Date  . Breast cancer ([place])   . Cancer ([place])    rt breast ca  . Hydronephrosis   . Hyperlipidemia   . Hypothyroidism   . Uterine leiomyoma   . Vitamin D deficiency 02/25/2012    Assessment: 89 y/oF who presented with 1 week of abdominal bloating and discomfort. CT abd/pelvis revealed large amount of ascites, diffuse mesenteric soft tissue stranding and omental caking, highly suspicious for peritoneal carcinomatosis, enlarged uterus due to fibroids, thickening of the endometrium in the fundal region, moderate left and small right pleural effusions. Lower extremity venous duplex performed today + for LLE DVT. Pharmacy consulted for IV heparin dosing for the DVT. Patient not on any anticoagulants PTA. H/H WNL, Pltc elevated at 520K.   Goal of Therapy:  Heparin level 0.3-0.7 units/ml Monitor platelets by anticoagulation protocol: Yes   Plan:   Baseline aPTT, PT/INR  Heparin 2000 units IV bolus x 1, then start infusion at 1150 units/hr  Heparin level 8 hours after initiation   Daily CBC, heparin level  Monitor closely for s/sx of bleeding   Lindell Spar, PharmD, BCPS Clinical Pharmacist  08/11/2020,6:40 PM

## 2020-08-11 NOTE — Telephone Encounter (Signed)
Called and spoke with the patient's son; scheduled a new patient appt on 10/14 with Dr Denman George. Explained that Dr Berline Lopes is out of the office for several weeks with an injured leg. He stated "I talked with the Dr Julien Girt office and they remanded taking her to the ER to have the fluid drained off to help with the pain. Does your office agree." Explained that since we have not seen her we can't give an advise.

## 2020-08-11 NOTE — ED Provider Notes (Signed)
[facility] DEPT Provider Note   CSN: [id number] Arrival date & time: 08/11/20  1329     History Chief Complaint  Patient presents with  . Abdominal Pain    Brandi Bowen is a 79 y.o. female who presents with about 1 week abdominal bloating and discomfort.  She states that only presented today because her son is in town visiting and encouraged her to come.  She has a history of large uterine fibroids (largest 17 cm) for many years. She had an MR of her pelvis on 08/04/20, which revealed large uterine fibroid fibroid uterus, a large amount of abdominal ascites.  She denies chest pain, shortness of breath cough, dyspnea on exertion, abdominal pain.  She endorses mild nausea, generalized back aching, decreased appetite, difficulty urinating (states she feels her bladder is compressed), and bilateral leg swelling that is new for her. She denies history of ascites in the past.  States that she only very occasionally participates in alcohol, has never had history of abuse.  I have personally reviewed this patient's medical records, she has a history of postmenopausal bleeding, uterine fibroids, and right breast cancer for which she was treated over 10 years ago.  She has not upcoming new patient appointment with gynecologic oncology, but the patient was unaware of this.  She states she does not know why it was made.   Collateral history obtained from her son [name] who is now at the bedside.  He states that gynecologic appointment scheduled for this coming Thursday was made due to concern for evolution of uterine fibroids into malignancy, per OB/GYN.  She is not vaccinated against COVID-19.  HPI     Past Medical History:  Diagnosis Date  . Breast cancer ([place])   . Cancer ([place])    rt breast ca  . Hydronephrosis   . Hyperlipidemia   . Hypothyroidism   . Uterine leiomyoma   . Vitamin D deficiency 02/25/2012    Patient Active Problem List   Diagnosis Date  Noted  . Ascites 08/11/2020  . Fibroids 01/17/2020  . Malignant neoplasm of overlapping sites of right breast in female, estrogen receptor positive ([place]) 03/04/2017  . Post-menopausal bleeding 01/12/2014    Past Surgical History:  Procedure Laterality Date  . MASTECTOMY       OB History    Gravida  0   Para  0   Term  0   Preterm  0   AB  0   Living  0     SAB  0   TAB  0   Ectopic  0   Multiple  0   Live Births  0           Family History  Problem Relation Age of Onset  . Cancer Mother        Colon cancer  . Cancer Brother        Prostate CA    Social History   Tobacco Use  . Smoking status: Never Smoker  . Smokeless tobacco: Never Used  Vaping Use  . Vaping Use: Never used  Substance Use Topics  . Alcohol use: No    Comment: Rare glass of wine  . Drug use: No    Home Medications Prior to Admission medications   Medication Sig Start Date End Date Taking? Authorizing Provider  atenolol (TENORMIN) 50 MG tablet Take 50 mg by mouth daily.   Yes [provider]  Cholecalciferol ([employer] VIT D 5000 HIGH-POTENCY) 5000 UNITS  capsule Take 1 capsule (5,000 Units total) by mouth daily. 07/18/14  Yes Magrinat, Virgie Dad, MD  Meclizine HCl (BONINE) 25 MG CHEW Chew 25 tablets by mouth daily as needed (nausea).   Yes [provider]  Multiple Vitamin (MULTIVITAMIN) capsule Take 1 capsule by mouth daily. 07/18/14  Yes Magrinat, Virgie Dad, MD  Omega-3 Fatty Acids (FISH OIL) 1000 MG CAPS Take 1 capsule by mouth daily.    Yes [provider]  Probiotic Product (PROBIOTIC DAILY PO) Take 1 capsule by mouth daily.    Yes [provider]  Specialty Vitamins Products (ONE-A-DAY BONE STRENGTH PO) Take 1 tablet by mouth in the morning and at bedtime.    Yes [provider]  vitamin C (ASCORBIC ACID) 500 MG tablet Take 500 mg by mouth 2 (two) times daily.    Yes [provider]    Allergies    Shrimp [shellfish  allergy]  Review of Systems   Review of Systems  Constitutional: Positive for fatigue. Negative for appetite change, chills, diaphoresis and fever.  HENT: Negative.   Eyes: Negative.   Respiratory: Negative for cough, choking, chest tightness and shortness of breath.   Cardiovascular: Positive for leg swelling. Negative for chest pain and palpitations.  Gastrointestinal: Positive for abdominal distention and nausea. Negative for abdominal pain, constipation, diarrhea and vomiting.  Genitourinary: Positive for difficulty urinating. Negative for dysuria, frequency, urgency, vaginal bleeding and vaginal discharge.  Musculoskeletal: Positive for back pain.  Neurological: Positive for weakness. Negative for dizziness, tremors, syncope, light-headedness and headaches.    Physical Exam Updated Vital Signs BP (!) 127/98 (BP Location: Right Arm)   Pulse 85   Temp 98.1 F (36.7 C) (Oral)   Resp 20   Ht 5' 5"  (1.651 m)   Wt 81.6 kg   SpO2 94%   BMI 29.95 kg/m   Physical Exam Vitals and nursing note reviewed.  HENT:     Head: Normocephalic and atraumatic.     Mouth/Throat:     Mouth: Mucous membranes are moist.     Pharynx: No oropharyngeal exudate or posterior oropharyngeal erythema.  Eyes:     General: No scleral icterus.       Right eye: No discharge.        Left eye: No discharge.     Conjunctiva/sclera: Conjunctivae normal.     Pupils: Pupils are equal, round, and reactive to light.  Cardiovascular:     Rate and Rhythm: Normal rate and regular rhythm.     Pulses:          Radial pulses are 1+ on the right side and 1+ on the left side.     Heart sounds: Normal heart sounds. No murmur heard.  No gallop.      Comments: Patient with shortness of breath speaking with me room.  Dorsalis pedis pulses nonpalpable, identified with Doppler still weak. Pulmonary:     Effort: Pulmonary effort is normal. No respiratory distress.     Breath sounds: Examination of the left-lower field  reveals decreased breath sounds. Decreased breath sounds present. No wheezing, rhonchi or rales.  Abdominal:     General: Abdomen is protuberant. A surgical scar is present. There is distension.     Palpations: Abdomen is rigid.     Tenderness: There is no abdominal tenderness. There is no right CVA tenderness, left CVA tenderness, guarding or rebound.       Comments: Unable to assess bowel sounds due to large abdominal ascites.  Musculoskeletal:  General: No deformity.     Cervical back: Neck supple. No spasms, tenderness or bony tenderness. No pain with movement, spinous process tenderness or muscular tenderness.     Thoracic back: No spasms, tenderness or bony tenderness.     Lumbar back: No spasms, tenderness or bony tenderness.     Right lower leg: 1+ Pitting Edema present.     Left lower leg: 1+ Pitting Edema present.  Lymphadenopathy:     Cervical: No cervical adenopathy.  Skin:    General: Skin is warm and dry.     Capillary Refill: Capillary refill takes less than 2 seconds.     Comments: Many moles, keratoses of her neck, chest, abdomen  Neurological:     General: No focal deficit present.     Mental Status: She is alert. Mental status is at baseline.  Psychiatric:        Mood and Affect: Mood normal.    ED Results / Procedures / Treatments   Labs (all labs ordered are listed, but only abnormal results are displayed) Labs Reviewed  COMPREHENSIVE METABOLIC PANEL - Abnormal; Notable for the following components:      Result Value   CO2 21 (*)    Glucose, Bld 151 (*)    BUN 26 (*)    Albumin 2.8 (*)    Alkaline Phosphatase 138 (*)    GFR, Estimated 55 (*)    Anion gap 17 (*)    All other components within normal limits  CBC - Abnormal; Notable for the following components:   WBC 17.3 (*)    Platelets 520 (*)    All other components within normal limits  URINALYSIS, ROUTINE W REFLEX MICROSCOPIC - Abnormal; Notable for the following components:   Color, Urine  AMBER (*)    APPearance HAZY (*)    Protein, ur 100 (*)    All other components within normal limits  PROTIME-INR - Abnormal; Notable for the following components:   Prothrombin Time 15.7 (*)    INR 1.3 (*)    All other components within normal limits  RESPIRATORY PANEL BY RT PCR (FLU A&B, COVID)  LIPASE, BLOOD  APTT  CBC  HEPARIN LEVEL (UNFRACTIONATED)    EKG EKG Interpretation  Date/Time:  Friday August 11 2020 16:24:00 EDT Ventricular Rate:  87 PR Interval:    QRS Duration: 78 QT Interval:  374 QTC Calculation: 450 R Axis:   -17 Text Interpretation: Sinus rhythm Borderline left axis deviation Abnormal R-wave progression, early transition No old tracing to compare Confirmed by Malvin Johns [phone]) on 08/11/2020 4:37:52 PM   Radiology DG Chest 2 View  Result Date: 08/11/2020 CLINICAL DATA:  Shortness of breath. Abdominal distention. Ascites. EXAM: CHEST - 2 VIEW COMPARISON:  None. FINDINGS: Moderate left pleural effusion and small right pleural effusion are seen. Atelectasis or consolidation is seen in the left lung base. Upper lung fields are clear. Heart size is within normal limits. IMPRESSION: Moderate left and small right pleural effusions. Left basilar atelectasis versus consolidation. Electronically Signed   By: Marlaine Hind M.D.   On: 08/11/2020 17:17   CT Abdomen Pelvis W Contrast  Result Date: 08/11/2020 CLINICAL DATA:  Abdominal distension. Ascites. Fibroids. Personal history of breast carcinoma. EXAM: CT ABDOMEN AND PELVIS WITH CONTRAST TECHNIQUE: Multidetector CT imaging of the abdomen and pelvis was performed using the standard protocol following bolus administration of intravenous contrast. CONTRAST:  181m OMNIPAQUE IOHEXOL 300 MG/ML  SOLN COMPARISON:  Pelvis MRI on 08/04/2020 FINDINGS: Lower Chest:  Moderate left and small right pleural effusions, with left greater than right lower lobe atelectasis. Previous right mastectomy. Hepatobiliary: No hepatic masses  identified. Gallbladder is unremarkable. No evidence of biliary ductal dilatation. Pancreas:  No mass or inflammatory changes. Spleen: Within normal limits in size and appearance. Adrenals/Urinary Tract: No masses identified. 5 cm left renal cyst noted. No evidence of ureteral calculi or hydronephrosis. Stomach/Bowel: Moderate hiatal hernia is seen. No evidence of obstruction, inflammatory process or abnormal fluid collections. Vascular/Lymphatic: No pathologically enlarged lymph nodes. No abdominal aortic aneurysm. Aortic atherosclerotic calcification noted. Reproductive: Markedly enlarged uterus is seen due to multiple partially calcified uterine fibroids. The largest fibroid is subserosal in location in the posterior uterine corpus measuring 15.6 cm. Thickening of the endometrium is seen in the fundal region uterus which measures up to 15 mm. Endometrial carcinoma cannot be excluded in a postmenopausal female. No separate adnexal masses are identified. Other: A large amount of ascites is seen. Diffuse mesenteric soft tissue stranding is seen as well as omental caking. Mild peritoneal thickening and enhancement is also seen in the lower abdomen pelvis. These findings are highly suspicious for peritoneal carcinomatosis. Musculoskeletal:  No suspicious bone lesions identified. IMPRESSION: Large amount of ascites, and diffuse mesenteric soft tissue stranding and omental caking, highly suspicious for peritoneal carcinomatosis. Markedly enlarged uterus due to multiple fibroids measuring up to 15 cm. Thickening of the endometrium in the fundal region up to 15 mm. Endometrial carcinoma cannot be excluded in a postmenopausal female. Moderate hiatal hernia. Moderate left and small right pleural effusions, with bilateral lower lobe atelectasis. Electronically Signed   By: Marlaine Hind M.D.   On: 08/11/2020 17:14   VAS [place] LOWER EXTREMITY VENOUS (DVT) (ONLY [facility] & [facility] 7a-7p)  Result Date: 08/11/2020  Lower Venous DVTStudy  Indications: Swelling.  Comparison Study: no prior Performing Technologist: Abram Sander RVS  Examination Guidelines: A complete evaluation includes B-mode imaging, spectral Doppler, color Doppler, and power Doppler as needed of all accessible portions of each vessel. Bilateral testing is considered an integral part of a complete examination. Limited examinations for reoccurring indications may be performed as noted. The reflux portion of the exam is performed with the patient in reverse Trendelenburg.  +-----+---------------+---------+-----------+----------+--------------+ RIGHTCompressibilityPhasicitySpontaneityPropertiesThrombus Aging +-----+---------------+---------+-----------+----------+--------------+ CFV  Full           Yes      Yes                                 +-----+---------------+---------+-----------+----------+--------------+   +---------+---------------+---------+-----------+----------+--------------+ LEFT     CompressibilityPhasicitySpontaneityPropertiesThrombus Aging +---------+---------------+---------+-----------+----------+--------------+ CFV      Full           Yes      Yes                                 +---------+---------------+---------+-----------+----------+--------------+ SFJ      Full                                                        +---------+---------------+---------+-----------+----------+--------------+ FV Prox  Full                                                        +---------+---------------+---------+-----------+----------+--------------+  FV Mid   Full                                                        +---------+---------------+---------+-----------+----------+--------------+ FV DistalFull                                                        +---------+---------------+---------+-----------+----------+--------------+ PFV      Full                                                         +---------+---------------+---------+-----------+----------+--------------+ POP      Full           Yes      Yes                                 +---------+---------------+---------+-----------+----------+--------------+ PTV      Full                                                        +---------+---------------+---------+-----------+----------+--------------+ PERO     None           No       No                   Acute          +---------+---------------+---------+-----------+----------+--------------+     Summary: RIGHT: - No evidence of common femoral vein obstruction.  LEFT: - Findings consistent with acute deep vein thrombosis involving the left peroneal veins. - No cystic structure found in the popliteal fossa.  *See table(s) above for measurements and observations. Electronically signed by Servando Snare MD on 08/11/2020 at 5:30:50 PM.    Final     Procedures Procedures (including critical care time)  Medications Ordered in ED Medications  heparin bolus via infusion 2,000 Units (2,000 Units Intravenous Bolus from Bag 08/11/20 2031)    Followed by  heparin ADULT infusion 100 units/mL (25000 units/245m sodium chloride 0.45%) (1,150 Units/hr Intravenous New Bag/Given 08/11/20 2036)  iohexol (OMNIPAQUE) 300 MG/ML solution 100 mL (100 mLs Intravenous Contrast Given 08/11/20 1631)  sodium chloride (PF) 0.9 % injection (  Given 08/11/20 1650)  morphine 2 MG/ML injection 2 mg (2 mg Intravenous Given 08/11/20 1845)  ondansetron (ZOFRAN) injection 4 mg (4 mg Intravenous Given 08/11/20 1843)  morphine 2 MG/ML injection 2 mg (2 mg Intravenous Given 08/11/20 2027)    ED Course  I have reviewed the triage vital signs and the nursing notes.  Pertinent labs & imaging results that were available during my care of the patient were reviewed by me and considered in my medical decision making (see chart for details).    MDM Rules/Calculators/A&P  79 year old patient  with known large uterine fibroids, now with new large abdominal ascites on MRI 08/04/2020. Differential diagnosis for etiology of abdominal distention includes is not limited to cirrhosis, malignancy, heart failure, intestinal obstruction, SBP.   Afebrile on intake, mildly hypertensive to 137/99  CBC with leukocytosis 17.3, thrombocytosis with platelets of 520  With elevated BUN 26, normal creatinine 0.97.  Hypoalbuminemia 2.8, elevated alk phos 138, anion gap mildly elevated to 17  UA significant for protein 100  INR mildly elevated to 1.3  EKG, chest x-ray, CT abdomen pelvis with contrast pending.  Pain medication for, antiemetic for nausea was offered.  Patient declined meds at this time.  Reevaluate the patient using Doppler to identify bilateral dorsalis pedis pulses.  Per discussion with attending physician, concern for left leg DVT.  Will order left leg venous Doppler US to rule out DVT.   Chest x-ray with moderate left pleural effusion and left basilar atelectasis versus consolidation; small right pleural effusion  CT abdomen pelvis with evidence of large amount of ascites, diffuse mesenteric soft tissue stranding and omental caking which is highly suspicious for peritoneal carcinomatosis.  Known marked large uterus multiple fibroids that have calcified.  Thickening in the endometrium concerning for endometrial carcinoma  Venous doppler of LLE: Venous ultrasound of the left lower extremity with acute DVT of the left peroneal veins.  Heparin per pharmacy ordered for anticoagulation  Morphine, Zofran ordered for patient comfort.  Plan to admit the patient to the hospital for further evaluation and for paracentesis to relieve discomfort from large ascites. At time of my reevaluation of patient she is lying comfortably in her hospital bed.  Her son [name] is at the bedside.  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  I explained plan to admit Raisha to the hospital for further evaluation and possible oncologic consultation.  Plan for inpatient paracentesis to relieve discomfort large abdominal ascites.  Both Kischa and her son voiced understanding of our findings and treatment plan.  Each of their questions were answered to their expressed satisfaction.  Care of this patient was staffed with attending physician Dr. Tamera Punt.  She agrees with plan to admit the patient.  My colleague Providence Lanius, PA-C consulted hospitalist Dr. Hal Hope who agreed to admit the patient. Appreciate his willingness to consult and care for this patient.    Final Clinical Impression(s) / ED Diagnoses Final diagnoses:  None    Rx / DC Orders ED Discharge Orders    None       Aura Dials 08/11/20 2043    Malvin Johns, MD 08/11/20 2146

## 2020-08-11 NOTE — ED Triage Notes (Signed)
Per pt, states she has fluid in her stomach-states lower extremity swelling and back pain as well-states she has'nt been urinating like she should-states she had a MRI last Friday and had a follow up appointment with PCP next Thursday-states symptoms might be related to her uterine fibroids

## 2020-08-11 NOTE — ED Notes (Signed)
Pt returned from CT °

## 2020-08-12 ENCOUNTER — Observation Stay (HOSPITAL_COMMUNITY): Payer: Medicare HMO

## 2020-08-12 DIAGNOSIS — I10 Essential (primary) hypertension: Secondary | ICD-10-CM | POA: Diagnosis present

## 2020-08-12 DIAGNOSIS — C786 Secondary malignant neoplasm of retroperitoneum and peritoneum: Secondary | ICD-10-CM | POA: Diagnosis present

## 2020-08-12 DIAGNOSIS — D72829 Elevated white blood cell count, unspecified: Secondary | ICD-10-CM | POA: Diagnosis present

## 2020-08-12 DIAGNOSIS — D219 Benign neoplasm of connective and other soft tissue, unspecified: Secondary | ICD-10-CM | POA: Diagnosis not present

## 2020-08-12 DIAGNOSIS — E039 Hypothyroidism, unspecified: Secondary | ICD-10-CM | POA: Diagnosis present

## 2020-08-12 DIAGNOSIS — J910 Malignant pleural effusion: Secondary | ICD-10-CM | POA: Diagnosis present

## 2020-08-12 DIAGNOSIS — E43 Unspecified severe protein-calorie malnutrition: Secondary | ICD-10-CM | POA: Diagnosis present

## 2020-08-12 DIAGNOSIS — Z20822 Contact with and (suspected) exposure to covid-19: Secondary | ICD-10-CM | POA: Diagnosis present

## 2020-08-12 DIAGNOSIS — I82452 Acute embolism and thrombosis of left peroneal vein: Secondary | ICD-10-CM

## 2020-08-12 DIAGNOSIS — R112 Nausea with vomiting, unspecified: Secondary | ICD-10-CM | POA: Diagnosis not present

## 2020-08-12 DIAGNOSIS — Z9221 Personal history of antineoplastic chemotherapy: Secondary | ICD-10-CM | POA: Diagnosis not present

## 2020-08-12 DIAGNOSIS — Z923 Personal history of irradiation: Secondary | ICD-10-CM | POA: Diagnosis not present

## 2020-08-12 DIAGNOSIS — D259 Leiomyoma of uterus, unspecified: Secondary | ICD-10-CM | POA: Diagnosis present

## 2020-08-12 DIAGNOSIS — Z9011 Acquired absence of right breast and nipple: Secondary | ICD-10-CM | POA: Diagnosis not present

## 2020-08-12 DIAGNOSIS — R180 Malignant ascites: Secondary | ICD-10-CM

## 2020-08-12 DIAGNOSIS — R1011 Right upper quadrant pain: Secondary | ICD-10-CM | POA: Diagnosis present

## 2020-08-12 DIAGNOSIS — E785 Hyperlipidemia, unspecified: Secondary | ICD-10-CM | POA: Diagnosis present

## 2020-08-12 DIAGNOSIS — R188 Other ascites: Secondary | ICD-10-CM | POA: Diagnosis present

## 2020-08-12 DIAGNOSIS — N179 Acute kidney failure, unspecified: Secondary | ICD-10-CM | POA: Diagnosis not present

## 2020-08-12 DIAGNOSIS — Z8042 Family history of malignant neoplasm of prostate: Secondary | ICD-10-CM | POA: Diagnosis not present

## 2020-08-12 DIAGNOSIS — Z66 Do not resuscitate: Secondary | ICD-10-CM | POA: Diagnosis not present

## 2020-08-12 DIAGNOSIS — K567 Ileus, unspecified: Secondary | ICD-10-CM | POA: Diagnosis not present

## 2020-08-12 DIAGNOSIS — R5381 Other malaise: Secondary | ICD-10-CM | POA: Diagnosis not present

## 2020-08-12 DIAGNOSIS — J90 Pleural effusion, not elsewhere classified: Secondary | ICD-10-CM | POA: Diagnosis not present

## 2020-08-12 DIAGNOSIS — E559 Vitamin D deficiency, unspecified: Secondary | ICD-10-CM | POA: Diagnosis present

## 2020-08-12 DIAGNOSIS — Z86711 Personal history of pulmonary embolism: Secondary | ICD-10-CM | POA: Diagnosis not present

## 2020-08-12 DIAGNOSIS — C269 Malignant neoplasm of ill-defined sites within the digestive system: Secondary | ICD-10-CM | POA: Diagnosis present

## 2020-08-12 DIAGNOSIS — E872 Acidosis: Secondary | ICD-10-CM | POA: Diagnosis present

## 2020-08-12 DIAGNOSIS — Z515 Encounter for palliative care: Secondary | ICD-10-CM | POA: Diagnosis not present

## 2020-08-12 DIAGNOSIS — C801 Malignant (primary) neoplasm, unspecified: Secondary | ICD-10-CM | POA: Diagnosis not present

## 2020-08-12 DIAGNOSIS — I2699 Other pulmonary embolism without acute cor pulmonale: Secondary | ICD-10-CM | POA: Diagnosis not present

## 2020-08-12 LAB — HEPATIC FUNCTION PANEL
ALT: 10 U/L (ref 0–44)
AST: 16 U/L (ref 15–41)
Albumin: 2.4 g/dL — ABNORMAL LOW (ref 3.5–5.0)
Alkaline Phosphatase: 112 U/L (ref 38–126)
Bilirubin, Direct: 0.1 mg/dL (ref 0.0–0.2)
Indirect Bilirubin: 0.7 mg/dL (ref 0.3–0.9)
Total Bilirubin: 0.8 mg/dL (ref 0.3–1.2)
Total Protein: 5.4 g/dL — ABNORMAL LOW (ref 6.5–8.1)

## 2020-08-12 LAB — CBC
HCT: 36.5 % (ref 36.0–46.0)
Hemoglobin: 11.4 g/dL — ABNORMAL LOW (ref 12.0–15.0)
MCH: 27 pg (ref 26.0–34.0)
MCHC: 31.2 g/dL (ref 30.0–36.0)
MCV: 86.5 fL (ref 80.0–100.0)
Platelets: 407 10*3/uL — ABNORMAL HIGH (ref 150–400)
RBC: 4.22 MIL/uL (ref 3.87–5.11)
RDW: 13.8 % (ref 11.5–15.5)
WBC: 13.6 10*3/uL — ABNORMAL HIGH (ref 4.0–10.5)
nRBC: 0 % (ref 0.0–0.2)

## 2020-08-12 LAB — BODY FLUID CELL COUNT WITH DIFFERENTIAL
Eos, Fluid: 5 %
Lymphs, Fluid: 74 %
Monocyte-Macrophage-Serous Fluid: 11 % — ABNORMAL LOW (ref 50–90)
Neutrophil Count, Fluid: 10 % (ref 0–25)
Total Nucleated Cell Count, Fluid: 977 cu mm (ref 0–1000)

## 2020-08-12 LAB — HEPARIN LEVEL (UNFRACTIONATED): Heparin Unfractionated: 0.39 IU/mL (ref 0.30–0.70)

## 2020-08-12 MED ORDER — LIP MEDEX EX OINT
TOPICAL_OINTMENT | CUTANEOUS | Status: AC
  Filled 2020-08-12: qty 7

## 2020-08-12 MED ORDER — LIDOCAINE HCL 1 % IJ SOLN
INTRAMUSCULAR | Status: AC
  Filled 2020-08-12: qty 20

## 2020-08-12 MED ORDER — RIVAROXABAN 15 MG PO TABS
15.0000 mg | ORAL_TABLET | Freq: Two times a day (BID) | ORAL | Status: DC
  Administered 2020-08-12 – 2020-08-16 (×8): 15 mg via ORAL
  Filled 2020-08-12 (×8): qty 1

## 2020-08-12 MED ORDER — RIVAROXABAN 20 MG PO TABS: 20.0000 mg | ORAL_TABLET | Freq: Every day | ORAL | Status: DC

## 2020-08-12 MED ORDER — TRAMADOL HCL 50 MG PO TABS
50.0000 mg | ORAL_TABLET | Freq: Four times a day (QID) | ORAL | Status: DC | PRN
  Administered 2020-08-12 – 2020-08-17 (×7): 50 mg via ORAL
  Filled 2020-08-12 (×8): qty 1

## 2020-08-12 NOTE — Consult Note (Signed)
Consult Note: Gyn-Onc  Consult was requested by Dr. Antonieta Pert for the evaluation of Brandi Bowen 79 y.o. female  CC:  Chief Complaint  Patient presents with  . Abdominal Pain    Assessment/Plan:  Brandi Bowen  is a 79 y.o.  year old with an acute DVT and apparent carcinomatosis and ascites in the setting of a fibroid uterus and thickened endometrium.  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  I am recommending paracentesis for symptom relief and to send for cytology to confirm diagnosis of malignancy and determine origin of cancer. We will facilitate referral to medical oncology to discuss chemotherapy options pending the results of this cytology.  I recommend converting heparin gtt to either lovenox or oral anticoagulant therapy (eg xarelto) given that she lives alone I favor oral. I do not plan that she will need to have invasive procedures other than paracentesis during this hospitalization therefore heparin gtt not necessary.   I counseled Brandi Whisman and her son regarding the CT findings and my suspicions regarding her underlying condition. I explained that I was recommending chemotherapy. She may or may not be a candidate for hysterectomy later (after she has shown response to chemotherapy). If this was stage IV endometrial cancer, she is not a candidate for primary debulking surgery given that she has severe protein wasting malnutrition, acute VTE for which she requires therapeutic anticoagulation and poor functional performance status.   I explained that I had concerns about her home situation (lives alone with no children/family close by). I recommended that she and her son discuss options for living arrangements for the future (eg assisted living, nursing home etc) as I anticipate that she will not be able to live home alone for much longer.   HPI: Brandi Bowen is a 79 year old parous woman who was seen in  consultation at the request of Dr Lupita Leash for evaluation of ascites, thickened endometrium and carcinomatosis in the setting of an acute DVT.  The patient had been previously seen by my partner, Dr. Gaylyn Rong, for evaluation of postmenopausal bleeding and fibroid uterus in June 2021.  Endometrial sampling was determined to be not possible at that time due to displacement of the uterus from a large posterior fibroid.  Given that she was having no ongoing symptoms of endometrial bleeding, and did not desire a radical open surgical procedure for diagnostic purposes, they elected for expectant management.  She was scheduled for repeat imaging and evaluation by [name] in December 2021.  She reported no ongoing bleeding after that episode in the spring 2021.  The patient reported that prior to the admission she had approximately 2-week history for abdominal distention, early satiety, and discomfort.  She notified her gynecologist and an MRI was performed of the pelvis on August 04, 2020 which showed numerous bulky uterine fibroids most notably a very large subserosal pedunculated fibroid in the posterior body measuring approximately 17 cm in largest dimension.  There was a large volume of ascites throughout the lower abdomen and pelvis.  The patient was scheduled to return to see [name] however became increasingly uncomfortable in her abdomen.  She presented to the emergency department on August 11, 2020 with symptoms of abdominal distention and discomfort.  A CT scan of the abdomen and pelvis at that time showed large volume ascites throughout the abdomen.  Bilateral pleural effusions that were small with the left greater than right.  Carcinomatosis and omental caking with diffuse mesenteric soft tissue stranding.  A markedly enlarged uterus with calcified fibroids.  Thickening of the endometrium in the fundal region measuring up to 15 mm.  She was diagnosed with a lower extremity DVT and started on IV  heparin.  Her family cancer history is her mother had a history of colon cancer in her 81s.  Her uncle had a history of prostate cancer.  She lives alone and reported being independent with ADL's. Her children do not live locally.   Current Meds: Current Facility-Administered Medications:  .  atenolol (TENORMIN) tablet 50 mg, 50 mg, Oral, QHS, Rise Patience, MD, 50 mg at 08/11/20 2239 .  [COMPLETED] heparin bolus via infusion 2,000 Units, 2,000 Units, Intravenous, STAT, 2,000 Units at 08/11/20 2031 **FOLLOWED BY** heparin ADULT infusion 100 units/mL (25000 units/255mL sodium chloride 0.45%), 1,150 Units/hr, Intravenous, Continuous, Rise Patience, MD, Last Rate: 11.5 mL/hr at 08/12/20 0622, 1,150 Units/hr at 08/12/20 0622 .  traMADol (ULTRAM) tablet 50 mg, 50 mg, Oral, Q6H PRN, Kc, Ramesh, MD, 50 mg at 08/12/20 1033   Allergy:  Allergies  Allergen Reactions  . Shrimp [Shellfish Allergy]     Social Hx:   Social History   Socioeconomic History  . Marital status: Widowed    Spouse name: Not on file  . Number of children: Not on file  . Years of education: Not on file  . Highest education level: Not on file  Occupational History  . Not on file  Tobacco Use  . Smoking status: Never Smoker  . Smokeless tobacco: Never Used  Vaping Use  . Vaping Use: Never used  Substance and Sexual Activity  . Alcohol use: No    Comment: Rare glass of wine  . Drug use: No  . Sexual activity: Yes    Birth control/protection: Post-menopausal  Other Topics Concern  . Not on file  Social History Narrative  . Not on file   Social Determinants of Health   Financial Resource Strain:   . Difficulty of Paying Living Expenses: Not on file  Food Insecurity:   . Worried About Charity fundraiser in the Last Year: Not on file  . Ran Out of Food in the Last Year: Not on file  Transportation Needs:   . Lack of Transportation (Medical): Not on file  . Lack of Transportation (Non-Medical):  Not on file  Physical Activity:   . Days of Exercise per Week: Not on file  . Minutes of Exercise per Session: Not on file  Stress:   . Feeling of Stress : Not on file  Social Connections:   . Frequency of Communication with Friends and Family: Not on file  . Frequency of Social Gatherings with Friends and Family: Not on file  . Attends Religious Services: Not on file  . Active Member of Clubs or Organizations: Not on file  . Attends Archivist Meetings: Not on file  . Marital Status: Not on file  Intimate Partner Violence:   . Fear of Current or Ex-Partner: Not on file  . Emotionally Abused: Not on file  . Physically Abused: Not on file  . Sexually Abused: Not on file    Past Surgical Hx:  Past Surgical History:  Procedure Laterality Date  . MASTECTOMY      Past Medical Hx:  Past Medical History:  Diagnosis Date  . Breast cancer ([place])   . Cancer ([place])    rt breast ca  . Hydronephrosis   .  Hyperlipidemia   . Hypothyroidism   . Uterine leiomyoma   . Vitamin D deficiency 02/25/2012    Past Gynecological History:   No LMP recorded. Patient is postmenopausal.  Family Hx:  Family History  Problem Relation Age of Onset  . Cancer Mother        Colon cancer  . Cancer Brother        Prostate CA    Review of Systems:  Constitutional  Feels fatigued  ENT Normal appearing ears and nares bilaterally Skin/Breast  No rash, sores, jaundice, itching, dryness Cardiovascular  No chest pain, shortness of breath, or edema  Pulmonary  No cough or wheeze.  Gastro Intestinal  No nausea, vomitting, or diarrhoea. No bright red blood per rectum, no abdominal pain, change in bowel movement, or constipation. + distension and early satiety. Genito Urinary  No frequency, urgency, dysuria, no bleeding Musculo Skeletal  No myalgia, arthralgia, joint swelling or pain  Neurologic  No weakness, numbness, change in gait,  Psychology  No depression, anxiety, insomnia.    Vitals:  Blood pressure 119/76, pulse 70, temperature 97.9 F (36.6 C), temperature source Oral, resp. rate 17, height 5\' 5"  (1.651 m), weight 190 lb 0.6 oz (86.2 kg), SpO2 94 %.  Physical Exam: WD in NAD Neck  Supple NROM, without any enlargements.  Lymph Node Survey No cervical supraclavicular or inguinal adenopathy Cardiovascular  Well perfused peripheries Lungs  No increased WOB Skin  No rash/lesions/breakdown  Psychiatry  Alert and oriented to person, place, and time  Abdomen  Normoactive bowel sounds, abdomen soft, non-tender and obese without evidence of hernia. Distended, dull to percuss.  Back No CVA tenderness Genito Urinary  Deferred due to lack of gyn exam table Rectal  deferred Extremities  Mild bilateral edema  60 minutes of total time was spent for this patient encounter, including preparation, face-to-face counseling with the patient and coordination of care, review of imaging (results and images), communication with the referring provider and documentation of the encounter. I personally spoke with Consulting provider, Dr Lupita Leash to convey my findings and plan.    Thereasa Solo, MD  08/12/2020, 10:36 AM

## 2020-08-12 NOTE — Progress Notes (Signed)
Pt back from ultrasound in stable condition. She did c/o belly cramping after procedure that is subsiding. Family at bedside.

## 2020-08-12 NOTE — Plan of Care (Signed)
  Problem: Clinical Measurements: Goal: Will remain free from infection Outcome: Progressing   Problem: Clinical Measurements: Goal: Diagnostic test results will improve Outcome: Progressing   Problem: Clinical Measurements: Goal: Respiratory complications will improve Outcome: Progressing   Problem: Activity: Goal: Risk for activity intolerance will decrease Outcome: Progressing   Problem: Nutrition: Goal: Adequate nutrition will be maintained Outcome: Progressing   Problem: Coping: Goal: Level of anxiety will decrease Outcome: Progressing   

## 2020-08-12 NOTE — Progress Notes (Signed)
PROGRESS NOTE    Brandi Bowen  IOX:[id number] DOB: [birth year] DOA: 08/11/2020 PCP: [name], PA-C   Chief Complaint  Patient presents with  . Abdominal Pain   Brief Narrative: As per admitting MD: 79 y.o. female with history of breast cancer in remission has been recently experiencing abdominal distention which has progressed over the last 2 weeks.  Patient has been followed by GYN oncology [name] for post menopausal bleeding previously.  Recently patient had followed up with gynecologist and had MRI of the pelvis which showed large fibroids with ascites and was advised to follow with [name].  Since patient's abdominal girth got worse patient presents to the ER.  Patient also noted increasing swelling in the left lower extremity.  ED Course: In the ER patient had a CT abdomen pelvis which shows features concerning for peritoneal carcinomatosis with significant ascites and bilateral pleural effusion.  There is also concern for endometrial malignancy.  Dopplers of the lower extremity showed left peroneal DVT.  For which patient was started on heparin.  Lab work show WBC count of 17.3 albumin 2.8 glucose 151.  Covid test was negative  Subjective:  BP stable, on RA, afebrile overnight Some back pain no other complaint chest pain.  Assessment & Plan:  New onset Ascites likely malignant/ with CT scan showing features concerning for peritoneal carcinomatosis, ascites bilateral pleural effusion and concern for endometrial malignancy.  Discussed with GYN oncology and as per recommendation plan for diagnostic and therapeutic paracentesis with cytology, IR consulted for ultrasound paracentesis.   Bilateral pleural effusion likely due to #1, daughter is visiting due to low albumin. Continue respiratory support  Acute deep vein thrombosis (DVT) of left lower extremity: Has been admitted overnight on heparin drip.  Can transition to oral DOAC after paracentesis-based on recommendation  from GYN oncology.  Discussed with the patient and family and they prefer Xarelto due to once a day dosing  Hypoalbuminemia, severely Albumin 2.8.  In the setting of #1.  Metabolic acidosis bicarb 21 on admission-encourage hydration  Hypertension BP is controlled.  Continue home atenolol.  Leukocytosis likely from #1.  No fever.  Repeat CBC.  Nutrition: Diet Order            Diet Heart Room service appropriate? Yes; Fluid consistency: Thin; Fluid restriction: 1200 mL Fluid  Diet effective now                DVT prophylaxis: iv heparin Code Status:   Code Status: Full Code  Family Communication: plan of care discussed with patient and her family at bedside.  Status is: Admitted as observation  Patient remains hospitalized for management of new onset ascites possible carcinomatosis endometrial carcinoma along with DVT requiring further input from consultation and procedures.   Dispo: The patient is from: Home              Anticipated d/c is to: Home              Anticipated d/c date is: 2 days              Patient currently is not medically stable to d/c.  Consultants:see note  Procedures:see note  Culture/Microbiology No results found for: SDES, SPECREQUEST, CULT, REPTSTATUS  Other culture-see note  Medications: Scheduled Meds: . atenolol  50 mg Oral QHS   Continuous Infusions: . heparin 1,150 Units/hr (08/12/20 0622)    Antimicrobials: Anti-infectives (From admission, onward)   None     Objective: Vitals: Today's  Vitals   08/12/20 0748 08/12/20 0920 08/12/20 1033 08/12/20 1118  BP:  119/76    Pulse:  70    Resp:  17    Temp:  97.9 F (36.6 C)    TempSrc:  Oral    SpO2:  94%    Weight:      Height:      PainSc: 0-No pain  6  3     Intake/Output Summary (Last 24 hours) at 08/12/2020 1243 Last data filed at 08/12/2020 0622 Gross per 24 hour  Intake 229.52 ml  Output 300 ml  Net -70.48 ml   Filed Weights   08/11/20 1833 08/11/20 2150  Weight:  81.6 kg 86.2 kg   Weight change:   Intake/Output from previous day: 10/08 0701 - 10/09 0700 In: 229.5 [P.O.:120; I.V.:109.5] Out: 300 [Urine:300] Intake/Output this shift: No intake/output data recorded.  Examination: General exam: AAO x3, elderly,NAD, weak appearing. HEENT:Oral mucosa moist, Ear/Nose WNL grossly,dentition normal. Respiratory system: bilaterally clear,no wheezing or crackles,no use of accessory muscle, non tender. Cardiovascular system: S1 & S2 +, regular, No JVD. Gastrointestinal system: Abdomen soft, distended w/ ascites, BS+. Nervous System:Alert, awake, moving extremities and grossly nonfocal Extremities: No edema, distal peripheral pulses palpable.  Skin: No rashes,no icterus. MSK: Normal muscle bulk,tone, power  Data Reviewed: I have personally reviewed following labs and imaging studies CBC: Recent Labs  Lab 08/11/20 1422 08/12/20 0721  WBC 17.3* 13.6*  HGB 13.4 11.4*  HCT 42.3 36.5  MCV 83.6 86.5  PLT 520* 063*   Basic Metabolic Panel: Recent Labs  Lab 08/11/20 1422  NA 138  K 4.4  CL 100  CO2 21*  GLUCOSE 151*  BUN 26*  CREATININE 0.97  CALCIUM 9.1   GFR: Estimated Creatinine Clearance: 50.2 mL/min (by C-G formula based on SCr of 0.97 mg/dL). Liver Function Tests: Recent Labs  Lab 08/11/20 1422 08/12/20 0721  AST 21 16  ALT 14 10  ALKPHOS 138* 112  BILITOT 0.5 0.8  PROT 6.5 5.4*  ALBUMIN 2.8* 2.4*   Recent Labs  Lab 08/11/20 1422  LIPASE 25   No results for input(s): AMMONIA in the last 168 hours. Coagulation Profile: Recent Labs  Lab 08/11/20 1835  INR 1.3*   Cardiac Enzymes: No results for input(s): CKTOTAL, CKMB, CKMBINDEX, TROPONINI in the last 168 hours. BNP (last 3 results) No results for input(s): PROBNP in the last 8760 hours. HbA1C: No results for input(s): HGBA1C in the last 72 hours. CBG: No results for input(s): GLUCAP in the last 168 hours. Lipid Profile: No results for input(s): CHOL, HDL,  LDLCALC, TRIG, CHOLHDL, LDLDIRECT in the last 72 hours. Thyroid Function Tests: No results for input(s): TSH, T4TOTAL, FREET4, T3FREE, THYROIDAB in the last 72 hours. Anemia Panel: No results for input(s): VITAMINB12, FOLATE, FERRITIN, TIBC, IRON, RETICCTPCT in the last 72 hours. Sepsis Labs: No results for input(s): PROCALCITON, LATICACIDVEN in the last 168 hours.  Recent Results (from the past 240 hour(s))  Respiratory Panel by RT PCR (Flu A&B, Covid) - Nasopharyngeal Swab     Status: None   Collection Time: 08/11/20  6:40 PM   Specimen: Nasopharyngeal Swab  Result Value Ref Range Status   SARS Coronavirus 2 by RT PCR NEGATIVE NEGATIVE Final    Comment: (NOTE) SARS-CoV-2 target nucleic acids are NOT DETECTED.  The SARS-CoV-2 RNA is generally detectable in upper respiratoy specimens during the acute phase of infection. The lowest concentration of SARS-CoV-2 viral copies this assay can detect is 131 copies/mL.  A negative result does not preclude SARS-Cov-2 infection and should not be used as the sole basis for treatment or other patient management decisions. A negative result may occur with  improper specimen collection/handling, submission of specimen other than nasopharyngeal swab, presence of viral mutation(s) within the areas targeted by this assay, and inadequate number of viral copies (<131 copies/mL). A negative result must be combined with clinical observations, patient history, and epidemiological information. The expected result is Negative.  Fact Sheet for Patients:  PinkCheek.be  Fact Sheet for Healthcare Providers:  GravelBags.it  This test is no t yet approved or cleared by the [place] FDA and  has been authorized for detection and/or diagnosis of SARS-CoV-2 by FDA under an Emergency Use Authorization (EUA). This EUA will remain  in effect (meaning this test can be used) for the duration of  the COVID-19 declaration under Section 564(b)(1) of the Act, 21 U.S.C. section 360bbb-3(b)(1), unless the authorization is terminated or revoked sooner.     Influenza A by PCR NEGATIVE NEGATIVE Final   Influenza B by PCR NEGATIVE NEGATIVE Final    Comment: (NOTE) The Xpert Xpress SARS-CoV-2/FLU/RSV assay is intended as an aid in  the diagnosis of influenza from Nasopharyngeal swab specimens and  should not be used as a sole basis for treatment. Nasal washings and  aspirates are unacceptable for Xpert Xpress SARS-CoV-2/FLU/RSV  testing.  Fact Sheet for Patients: PinkCheek.be  Fact Sheet for Healthcare Providers: GravelBags.it  This test is not yet approved or cleared by the [place] FDA and  has been authorized for detection and/or diagnosis of SARS-CoV-2 by  FDA under an Emergency Use Authorization (EUA). This EUA will remain  in effect (meaning this test can be used) for the duration of the  Covid-19 declaration under Section 564(b)(1) of the Act, 21  U.S.C. section 360bbb-3(b)(1), unless the authorization is  terminated or revoked. Performed at [facility], [place] [address] [place], [place] [address]      Radiology Studies: DG Chest 2 View  Result Date: 08/11/2020 CLINICAL DATA:  Shortness of breath. Abdominal distention. Ascites. EXAM: CHEST - 2 VIEW COMPARISON:  None. FINDINGS: Moderate left pleural effusion and small right pleural effusion are seen. Atelectasis or consolidation is seen in the left lung base. Upper lung fields are clear. Heart size is within normal limits. IMPRESSION: Moderate left and small right pleural effusions. Left basilar atelectasis versus consolidation. Electronically Signed   By: Marlaine Hind M.D.   On: 08/11/2020 17:17   CT Abdomen Pelvis W Contrast  Result Date: 08/11/2020 CLINICAL DATA:  Abdominal distension. Ascites. Fibroids. Personal history of breast  carcinoma. EXAM: CT ABDOMEN AND PELVIS WITH CONTRAST TECHNIQUE: Multidetector CT imaging of the abdomen and pelvis was performed using the standard protocol following bolus administration of intravenous contrast. CONTRAST:  170mL OMNIPAQUE IOHEXOL 300 MG/ML  SOLN COMPARISON:  Pelvis MRI on 08/04/2020 FINDINGS: Lower Chest: Moderate left and small right pleural effusions, with left greater than right lower lobe atelectasis. Previous right mastectomy. Hepatobiliary: No hepatic masses identified. Gallbladder is unremarkable. No evidence of biliary ductal dilatation. Pancreas:  No mass or inflammatory changes. Spleen: Within normal limits in size and appearance. Adrenals/Urinary Tract: No masses identified. 5 cm left renal cyst noted. No evidence of ureteral calculi or hydronephrosis. Stomach/Bowel: Moderate hiatal hernia is seen. No evidence of obstruction, inflammatory process or abnormal fluid collections. Vascular/Lymphatic: No pathologically enlarged lymph nodes. No abdominal aortic aneurysm. Aortic atherosclerotic calcification noted. Reproductive: Markedly enlarged uterus is seen  due to multiple partially calcified uterine fibroids. The largest fibroid is subserosal in location in the posterior uterine corpus measuring 15.6 cm. Thickening of the endometrium is seen in the fundal region uterus which measures up to 15 mm. Endometrial carcinoma cannot be excluded in a postmenopausal female. No separate adnexal masses are identified. Other: A large amount of ascites is seen. Diffuse mesenteric soft tissue stranding is seen as well as omental caking. Mild peritoneal thickening and enhancement is also seen in the lower abdomen pelvis. These findings are highly suspicious for peritoneal carcinomatosis. Musculoskeletal:  No suspicious bone lesions identified. IMPRESSION: Large amount of ascites, and diffuse mesenteric soft tissue stranding and omental caking, highly suspicious for peritoneal carcinomatosis. Markedly  enlarged uterus due to multiple fibroids measuring up to 15 cm. Thickening of the endometrium in the fundal region up to 15 mm. Endometrial carcinoma cannot be excluded in a postmenopausal female. Moderate hiatal hernia. Moderate left and small right pleural effusions, with bilateral lower lobe atelectasis. Electronically Signed   By: Marlaine Hind M.D.   On: 08/11/2020 17:14   VAS [place] LOWER EXTREMITY VENOUS (DVT) (ONLY [facility] & [facility] 7a-7p)  Result Date: 08/11/2020  Lower Venous DVTStudy Indications: Swelling.  Comparison Study: no prior Performing Technologist: Abram Sander RVS  Examination Guidelines: A complete evaluation includes B-mode imaging, spectral Doppler, color Doppler, and power Doppler as needed of all accessible portions of each vessel. Bilateral testing is considered an integral part of a complete examination. Limited examinations for reoccurring indications may be performed as noted. The reflux portion of the exam is performed with the patient in reverse Trendelenburg.  +-----+---------------+---------+-----------+----------+--------------+ RIGHTCompressibilityPhasicitySpontaneityPropertiesThrombus Aging +-----+---------------+---------+-----------+----------+--------------+ CFV  Full           Yes      Yes                                 +-----+---------------+---------+-----------+----------+--------------+   +---------+---------------+---------+-----------+----------+--------------+ LEFT     CompressibilityPhasicitySpontaneityPropertiesThrombus Aging +---------+---------------+---------+-----------+----------+--------------+ CFV      Full           Yes      Yes                                 +---------+---------------+---------+-----------+----------+--------------+ SFJ      Full                                                        +---------+---------------+---------+-----------+----------+--------------+ FV Prox  Full                                                         +---------+---------------+---------+-----------+----------+--------------+ FV Mid   Full                                                        +---------+---------------+---------+-----------+----------+--------------+ FV DistalFull                                                        +---------+---------------+---------+-----------+----------+--------------+  PFV      Full                                                        +---------+---------------+---------+-----------+----------+--------------+ POP      Full           Yes      Yes                                 +---------+---------------+---------+-----------+----------+--------------+ PTV      Full                                                        +---------+---------------+---------+-----------+----------+--------------+ PERO     None           No       No                   Acute          +---------+---------------+---------+-----------+----------+--------------+     Summary: RIGHT: - No evidence of common femoral vein obstruction.  LEFT: - Findings consistent with acute deep vein thrombosis involving the left peroneal veins. - No cystic structure found in the popliteal fossa.  *See table(s) above for measurements and observations. Electronically signed by Servando Snare MD on 08/11/2020 at 5:30:50 PM.    Final      LOS: 0 days   Antonieta Pert, MD [facility]  08/12/2020, 12:43 PM

## 2020-08-12 NOTE — Progress Notes (Signed)
Ultrasound contacted concerning paracentesis today. Tech to call pa to arrange this today. Rn did notify them that the pt is on heparin gtt.

## 2020-08-12 NOTE — Progress Notes (Addendum)
[place] for IV heparin transition to Xarelto Indication: DVT  Allergies  Allergen Reactions  . Shrimp [Shellfish Allergy]    Patient Measurements: Height: 5\' 5"  (165.1 cm) Weight: 86.2 kg (190 lb 0.6 oz) IBW/kg (Calculated) : 57 Heparin Dosing Weight: 74.4 kg  Vital Signs: Temp: 97.9 F (36.6 C) (10/09 0530) Temp Source: Oral (10/09 0530) BP: 136/78 (10/09 0530) Pulse Rate: 65 (10/09 0530)  Labs: Recent Labs    08/11/20 1422 08/11/20 1835 08/12/20 0721  HGB 13.4  --  11.4*  HCT 42.3  --  36.5  PLT 520*  --  407*  APTT  --  30  --   LABPROT  --  15.7*  --   INR  --  1.3*  --   HEPARINUNFRC  --   --  0.39  CREATININE 0.97  --   --    Estimated Creatinine Clearance: 50.2 mL/min (by C-G formula based on SCr of 0.97 mg/dL).  Medical History: Past Medical History:  Diagnosis Date  . Breast cancer ([place])   . Cancer ([place])    rt breast ca  . Hydronephrosis   . Hyperlipidemia   . Hypothyroidism   . Uterine leiomyoma   . Vitamin D deficiency 02/25/2012   Assessment: 46 y/oF who presented with 1 week of abdominal bloating and discomfort. CT abd/pelvis revealed large amount of ascites, diffuse mesenteric soft tissue stranding and omental caking, highly suspicious for peritoneal carcinomatosis, enlarged uterus due to fibroids, thickening of the endometrium in the fundal region, moderate left and small right pleural effusions. Lower extremity venous duplex performed today + for LLE DVT. Pharmacy consulted for IV heparin dosing for the DVT. Patient not on any anticoagulants PTA. H/H WNL, Pltc elev at 520K.    Baseline aPTT, PT/INR ~ wnl  Heparin 2000 units IV bolus x 1, then start infusion at 1150 units/hr begin 10/8 pm  Note R arm restricted for blood draw s/p mastectomy  Goal of Therapy:  Heparin level 0.3-0.7 units/ml Monitor platelets by anticoagulation protocol: Yes   Today, 08/12/2020 Heparin infusing into L arm,  venipuncture from hand 0721 Hep level = 0.39 units/ml, in therapeutic range Likely need paracentesis, anticipate Heparin would be held around procedure per MD   Plan:   Continue Heparin at 1150 units/hr  Recheck Heparin level in 8 hours    Daily CBC, order heparin level at steady state  Monitor closely for s/sx of bleeding  Minda Ditto PharmD 08/12/2020, 8:52 AM   Addendum 2:52 PM  Begin Xarelto 10/9 for LLE DVT Xarelto 15mg  bid with a meal x 21 days, then 20mg  daily with a meal on 10/31 Discontinue Heparin at 1700 - same time as first Xarelto dose Will counsel patient 10/10  Minda Ditto PharmD 08/12/2020, 2:53 PM

## 2020-08-12 NOTE — Procedures (Signed)
PROCEDURE SUMMARY:  Successful US guided paracentesis from right abdomen  Yielded 4.8 L of light pink fluid.  No immediate complications.  Pt tolerated well.   Specimen sent for labs.  EBL < 51mL  Theresa Duty, NP 08/12/2020 1:51 PM

## 2020-08-13 DIAGNOSIS — R180 Malignant ascites: Secondary | ICD-10-CM | POA: Diagnosis not present

## 2020-08-13 LAB — BASIC METABOLIC PANEL
Anion gap: 10 (ref 5–15)
BUN: 28 mg/dL — ABNORMAL HIGH (ref 8–23)
CO2: 22 mmol/L (ref 22–32)
Calcium: 8.4 mg/dL — ABNORMAL LOW (ref 8.9–10.3)
Chloride: 103 mmol/L (ref 98–111)
Creatinine, Ser: 0.97 mg/dL (ref 0.44–1.00)
GFR, Estimated: 55 mL/min — ABNORMAL LOW (ref >60)
Glucose, Bld: 134 mg/dL — ABNORMAL HIGH (ref 70–99)
Potassium: 4.3 mmol/L (ref 3.5–5.1)
Sodium: 135 mmol/L (ref 135–145)

## 2020-08-13 LAB — CBC
HCT: 38.2 % (ref 36.0–46.0)
Hemoglobin: 12.2 g/dL (ref 12.0–15.0)
MCH: 27 pg (ref 26.0–34.0)
MCHC: 31.9 g/dL (ref 30.0–36.0)
MCV: 84.5 fL (ref 80.0–100.0)
Platelets: 424 10*3/uL — ABNORMAL HIGH (ref 150–400)
RBC: 4.52 MIL/uL (ref 3.87–5.11)
RDW: 13.7 % (ref 11.5–15.5)
WBC: 14.9 10*3/uL — ABNORMAL HIGH (ref 4.0–10.5)
nRBC: 0 % (ref 0.0–0.2)

## 2020-08-13 MED ORDER — POLYETHYLENE GLYCOL 3350 17 G PO PACK
17.0000 g | PACK | Freq: Every day | ORAL | Status: DC | PRN
  Administered 2020-08-13: 17 g via ORAL
  Filled 2020-08-13: qty 1

## 2020-08-13 MED ORDER — DOCUSATE SODIUM 100 MG PO CAPS
100.0000 mg | ORAL_CAPSULE | Freq: Two times a day (BID) | ORAL | Status: DC
  Administered 2020-08-13 – 2020-08-18 (×10): 100 mg via ORAL
  Filled 2020-08-13 (×11): qty 1

## 2020-08-13 MED ORDER — ONDANSETRON HCL 4 MG/2ML IJ SOLN
4.0000 mg | Freq: Four times a day (QID) | INTRAMUSCULAR | Status: DC | PRN
  Administered 2020-08-13 – 2020-08-19 (×11): 4 mg via INTRAVENOUS
  Filled 2020-08-13 (×11): qty 2

## 2020-08-13 NOTE — Progress Notes (Signed)
Pt did well with ambulation in hall. No walker or equipment needed. Pt was weak overall but tolerated ambulation well. Family at pt side for walk as well. Rn will continue to monitor.

## 2020-08-13 NOTE — Progress Notes (Signed)
PROGRESS NOTE    Brandi Bowen  BJY:[id number] DOB: 1941/06/26 DOA: 08/11/2020 PCP: [name], PA-C   Chief Complaint  Patient presents with  . Abdominal Pain   Brief Narrative: As per admitting MD: 79 y.o. female with history of breast cancer in remission has been recently experiencing abdominal distention which has progressed over the last 2 weeks.  Patient has been followed by GYN oncology [name] for post menopausal bleeding previously.  Recently patient had followed up with gynecologist and had MRI of the pelvis which showed large fibroids with ascites and was advised to follow with [name].  Since patient's abdominal girth got worse patient presents to the ER.  Patient also noted increasing swelling in the left lower extremity.  ED Course: In the ER patient had a CT abdomen pelvis which shows features concerning for peritoneal carcinomatosis with significant ascites and bilateral pleural effusion.  There is also concern for endometrial malignancy.  Dopplers of the lower extremity showed left peroneal DVT.  For which patient was started on heparin.  Lab work show WBC count of 17.3 albumin 2.8 glucose 151.  Covid test was negative Patient was admitted and seen by oncology.  Subjective:  This morning having constipation most bowel movement on Friday but no abdominal pain.  ;later in noon  was nauseous.  Ambulatory in the hallway but appeared weak. No chest pain  vomiting fever chills. Assessment & Plan:  New onset Ascites likely malignant/ with CT scan showing features concerning for peritoneal carcinomatosis, ascites bilateral pleural effusion and concern for endometrial malignancy.  Appreciate Dr. Leucine code, status post diagnostic/therapeutic paracentesis, concomitantly.  Light pink fluid removed.  Cytology pending.  Gram stain no growth.  Patient will need outpatient follow-up to be arranged by her oncologist.    Bilateral pleural effusion likely due to #1:  Asymptomatic.   Acute deep vein thrombosis (DVT) of left lower extremity: Apparently it has been constant Xarelto based upon GYN oncology recommendation  Hypoalbuminemia, severely Albumin 2.8.  In the setting of #1.  Metabolic acidosis bicarb 21 on admission-resolved  Hypertension: Controlled continue home atenolol.  Leukocytosis likely from #1.  No fever. Wbc 13-14k.  Constipation added Colace and as needed MiraLAX.  Likely in the setting of pain medication use ascites  Nausea added Zofran.  Deconditioning continue to monitor.Increase activity.  Nutrition: Diet Order            Diet Heart Room service appropriate? Yes; Fluid consistency: Thin; Fluid restriction: 1500 mL Fluid  Diet effective now                DVT prophylaxis: iv heparin Code Status:   Code Status: Full Code  Family Communication: plan of care discussed with patient and her family at bedside.  Status is: Admitted as observation  Patient was changed to inpatient due to ongoing need for inpatient hospitalization for diagnostic and therapeutic work-up of her ascites, new cancer, and consultation from oncology.  Dispo: The patient is from: Home              Anticipated d/c is to: Home              Anticipated d/c date is: 1 days              Patient currently is not medically stable to d/c.  Consultants:see note  Procedures:see note  Culture/Microbiology    Component Value Date/Time   SDES  08/12/2020 1402    PERITONEAL Performed at [facility]  [place] [address] [place], [place] [address]    SPECREQUEST  08/12/2020 1402    NONE Performed at [facility], [place] [address], [place], [place] [address]    REPTSTATUS PENDING 08/12/2020 1402    Other culture-see note  Medications: Scheduled Meds: . atenolol  50 mg Oral QHS  . docusate sodium  100 mg Oral BID  . rivaroxaban  15 mg Oral BID WC   Followed by  . [START ON 09/03/2020] rivaroxaban  20 mg Oral Q  breakfast   Continuous Infusions:   Antimicrobials: Anti-infectives (From admission, onward)   None     Objective: Vitals: Today's Vitals   08/13/20 0543 08/13/20 0714 08/13/20 0734 08/13/20 0834  BP: 114/69     Pulse: 72     Resp: 14     Temp: 97.6 F (36.4 C)     TempSrc: Oral     SpO2: 93%     Weight:      Height:      PainSc:  0-No pain 6  3     Intake/Output Summary (Last 24 hours) at 08/13/2020 1343 Last data filed at 08/13/2020 1100 Gross per 24 hour  Intake 1037.26 ml  Output --  Net 1037.26 ml   Filed Weights   08/11/20 1833 08/11/20 2150  Weight: 81.6 kg 86.2 kg   Weight change:   Intake/Output from previous day: 10/09 0701 - 10/10 0700 In: 1199.1 [P.O.:1080; I.V.:119.1] Out: -  Intake/Output this shift: Total I/O In: 240 [P.O.:240] Out: -   Examination: General exam: AAOx3, elderly,NAD, weak appearing. HEENT:Oral mucosa moist, Ear/Nose WNL grossly, dentition normal. Respiratory system: bilaterally clear,no wheezing or crackles,no use of accessory muscle Cardiovascular system: S1 & S2 +, No JVD,. Gastrointestinal system: Abdomen soft, mildly distended, nontender,BS+ Nervous System:Alert, awake, moving extremities and grossly nonfocal Extremities: No edema, distal peripheral pulses palpable.  Skin: No rashes,no icterus. MSK: Normal muscle bulk,tone, power   Data Reviewed: I have personally reviewed following labs and imaging studies CBC: Recent Labs  Lab 08/11/20 1422 08/12/20 0721 08/13/20 0646  WBC 17.3* 13.6* 14.9*  HGB 13.4 11.4* 12.2  HCT 42.3 36.5 38.2  MCV 83.6 86.5 84.5  PLT 520* 407* 270*   Basic Metabolic Panel: Recent Labs  Lab 08/11/20 1422 08/13/20 0646  NA 138 135  K 4.4 4.3  CL 100 103  CO2 21* 22  GLUCOSE 151* 134*  BUN 26* 28*  CREATININE 0.97 0.97  CALCIUM 9.1 8.4*   GFR: Estimated Creatinine Clearance: 50.2 mL/min (by C-G formula based on SCr of 0.97 mg/dL). Liver Function Tests: Recent Labs  Lab  08/11/20 1422 08/12/20 0721  AST 21 16  ALT 14 10  ALKPHOS 138* 112  BILITOT 0.5 0.8  PROT 6.5 5.4*  ALBUMIN 2.8* 2.4*   Recent Labs  Lab 08/11/20 1422  LIPASE 25   No results for input(s): AMMONIA in the last 168 hours. Coagulation Profile: Recent Labs  Lab 08/11/20 1835  INR 1.3*   Cardiac Enzymes: No results for input(s): CKTOTAL, CKMB, CKMBINDEX, TROPONINI in the last 168 hours. BNP (last 3 results) No results for input(s): PROBNP in the last 8760 hours. HbA1C: No results for input(s): HGBA1C in the last 72 hours. CBG: No results for input(s): GLUCAP in the last 168 hours. Lipid Profile: No results for input(s): CHOL, HDL, LDLCALC, TRIG, CHOLHDL, LDLDIRECT in the last 72 hours. Thyroid Function Tests: No results for input(s): TSH, T4TOTAL, FREET4, T3FREE, THYROIDAB in the last 72  hours. Anemia Panel: No results for input(s): VITAMINB12, FOLATE, FERRITIN, TIBC, IRON, RETICCTPCT in the last 72 hours. Sepsis Labs: No results for input(s): PROCALCITON, LATICACIDVEN in the last 168 hours.  Recent Results (from the past 240 hour(s))  Respiratory Panel by RT PCR (Flu A&B, Covid) - Nasopharyngeal Swab     Status: None   Collection Time: 08/11/20  6:40 PM   Specimen: Nasopharyngeal Swab  Result Value Ref Range Status   SARS Coronavirus 2 by RT PCR NEGATIVE NEGATIVE Final    Comment: (NOTE) SARS-CoV-2 target nucleic acids are NOT DETECTED.  The SARS-CoV-2 RNA is generally detectable in upper respiratoy specimens during the acute phase of infection. The lowest concentration of SARS-CoV-2 viral copies this assay can detect is 131 copies/mL. A negative result does not preclude SARS-Cov-2 infection and should not be used as the sole basis for treatment or other patient management decisions. A negative result may occur with  improper specimen collection/handling, submission of specimen other than nasopharyngeal swab, presence of viral mutation(s) within the areas targeted  by this assay, and inadequate number of viral copies (<131 copies/mL). A negative result must be combined with clinical observations, patient history, and epidemiological information. The expected result is Negative.  Fact Sheet for Patients:  PinkCheek.be  Fact Sheet for Healthcare Providers:  GravelBags.it  This test is no t yet approved or cleared by the [place] FDA and  has been authorized for detection and/or diagnosis of SARS-CoV-2 by FDA under an Emergency Use Authorization (EUA). This EUA will remain  in effect (meaning this test can be used) for the duration of the COVID-19 declaration under Section 564(b)(1) of the Act, 21 U.S.C. section 360bbb-3(b)(1), unless the authorization is terminated or revoked sooner.     Influenza A by PCR NEGATIVE NEGATIVE Final   Influenza B by PCR NEGATIVE NEGATIVE Final    Comment: (NOTE) The Xpert Xpress SARS-CoV-2/FLU/RSV assay is intended as an aid in  the diagnosis of influenza from Nasopharyngeal swab specimens and  should not be used as a sole basis for treatment. Nasal washings and  aspirates are unacceptable for Xpert Xpress SARS-CoV-2/FLU/RSV  testing.  Fact Sheet for Patients: PinkCheek.be  Fact Sheet for Healthcare Providers: GravelBags.it  This test is not yet approved or cleared by the [place] FDA and  has been authorized for detection and/or diagnosis of SARS-CoV-2 by  FDA under an Emergency Use Authorization (EUA). This EUA will remain  in effect (meaning this test can be used) for the duration of the  Covid-19 declaration under Section 564(b)(1) of the Act, 21  U.S.C. section 360bbb-3(b)(1), unless the authorization is  terminated or revoked. Performed at [facility], [place] [address] [place], [place] [address]   Gram stain     Status: None (Preliminary result)    Collection Time: 08/12/20  2:02 PM   Specimen: Peritoneal Washings  Result Value Ref Range Status   Specimen Description   Final    PERITONEAL Performed at [place] [address], [place], [place] [address]    Special Requests   Final    NONE Performed at [facility], [place] [address], [place], [place] [address]    Gram Stain   Final    RARE WBC PRESENT, PREDOMINANTLY MONONUCLEAR NO ORGANISMS SEEN Performed at [facility], [place] [address], [place], [place] [address]    Report Status PENDING  Incomplete     Radiology Studies: DG Chest 2 View  Result  Date: 08/11/2020 CLINICAL DATA:  Shortness of breath. Abdominal distention. Ascites. EXAM: CHEST - 2 VIEW COMPARISON:  None. FINDINGS: Moderate left pleural effusion and small right pleural effusion are seen. Atelectasis or consolidation is seen in the left lung base. Upper lung fields are clear. Heart size is within normal limits. IMPRESSION: Moderate left and small right pleural effusions. Left basilar atelectasis versus consolidation. Electronically Signed   By: Marlaine Hind M.D.   On: 08/11/2020 17:17   CT Abdomen Pelvis W Contrast  Result Date: 08/11/2020 CLINICAL DATA:  Abdominal distension. Ascites. Fibroids. Personal history of breast carcinoma. EXAM: CT ABDOMEN AND PELVIS WITH CONTRAST TECHNIQUE: Multidetector CT imaging of the abdomen and pelvis was performed using the standard protocol following bolus administration of intravenous contrast. CONTRAST:  110mL OMNIPAQUE IOHEXOL 300 MG/ML  SOLN COMPARISON:  Pelvis MRI on 08/04/2020 FINDINGS: Lower Chest: Moderate left and small right pleural effusions, with left greater than right lower lobe atelectasis. Previous right mastectomy. Hepatobiliary: No hepatic masses identified. Gallbladder is unremarkable. No evidence of biliary ductal dilatation. Pancreas:  No mass or inflammatory changes. Spleen: Within normal limits in size and appearance.  Adrenals/Urinary Tract: No masses identified. 5 cm left renal cyst noted. No evidence of ureteral calculi or hydronephrosis. Stomach/Bowel: Moderate hiatal hernia is seen. No evidence of obstruction, inflammatory process or abnormal fluid collections. Vascular/Lymphatic: No pathologically enlarged lymph nodes. No abdominal aortic aneurysm. Aortic atherosclerotic calcification noted. Reproductive: Markedly enlarged uterus is seen due to multiple partially calcified uterine fibroids. The largest fibroid is subserosal in location in the posterior uterine corpus measuring 15.6 cm. Thickening of the endometrium is seen in the fundal region uterus which measures up to 15 mm. Endometrial carcinoma cannot be excluded in a postmenopausal female. No separate adnexal masses are identified. Other: A large amount of ascites is seen. Diffuse mesenteric soft tissue stranding is seen as well as omental caking. Mild peritoneal thickening and enhancement is also seen in the lower abdomen pelvis. These findings are highly suspicious for peritoneal carcinomatosis. Musculoskeletal:  No suspicious bone lesions identified. IMPRESSION: Large amount of ascites, and diffuse mesenteric soft tissue stranding and omental caking, highly suspicious for peritoneal carcinomatosis. Markedly enlarged uterus due to multiple fibroids measuring up to 15 cm. Thickening of the endometrium in the fundal region up to 15 mm. Endometrial carcinoma cannot be excluded in a postmenopausal female. Moderate hiatal hernia. Moderate left and small right pleural effusions, with bilateral lower lobe atelectasis. Electronically Signed   By: Marlaine Hind M.D.   On: 08/11/2020 17:14   US Paracentesis  Result Date: 08/12/2020 INDICATION: Patient with a history of breast cancer now with suspected endometrial malignancy presents today with ascites. Interventional radiology asked to perform a therapeutic and diagnostic paracentesis. EXAM: ULTRASOUND GUIDED PARACENTESIS  MEDICATIONS: 1% lidocaine 10 mL COMPLICATIONS: None immediate PROCEDURE: Informed written consent was obtained from the patient after a discussion of the risks, benefits and alternatives to treatment. A timeout was performed prior to the initiation of the procedure. Initial ultrasound scanning demonstrates a large amount of ascites within the right lower abdominal quadrant. The right lower abdomen was prepped and draped in the usual sterile fashion. 1% lidocaine was used for local anesthesia. Following this, a 19 gauge, 7-cm, Yueh catheter was introduced. An ultrasound image was saved for documentation purposes. The paracentesis was performed. The catheter was removed and a dressing was applied. The patient tolerated the procedure well without immediate post procedural complication. FINDINGS: A total of approximately 4.8 L of light pink fluid  was removed. Samples were sent to the laboratory as requested by the clinical team. IMPRESSION: Successful ultrasound-guided paracentesis yielding 4.8 liters of peritoneal fluid. Read by: Soyla Dryer, NP Electronically Signed   By: Lucrezia Europe M.D.   On: 08/12/2020 13:50   VAS [place] LOWER EXTREMITY VENOUS (DVT) (ONLY [facility] & [facility] 7a-7p)  Result Date: 08/11/2020  Lower Venous DVTStudy Indications: Swelling.  Comparison Study: no prior Performing Technologist: Abram Sander RVS  Examination Guidelines: A complete evaluation includes B-mode imaging, spectral Doppler, color Doppler, and power Doppler as needed of all accessible portions of each vessel. Bilateral testing is considered an integral part of a complete examination. Limited examinations for reoccurring indications may be performed as noted. The reflux portion of the exam is performed with the patient in reverse Trendelenburg.  +-----+---------------+---------+-----------+----------+--------------+ RIGHTCompressibilityPhasicitySpontaneityPropertiesThrombus Aging  +-----+---------------+---------+-----------+----------+--------------+ CFV  Full           Yes      Yes                                 +-----+---------------+---------+-----------+----------+--------------+   +---------+---------------+---------+-----------+----------+--------------+ LEFT     CompressibilityPhasicitySpontaneityPropertiesThrombus Aging +---------+---------------+---------+-----------+----------+--------------+ CFV      Full           Yes      Yes                                 +---------+---------------+---------+-----------+----------+--------------+ SFJ      Full                                                        +---------+---------------+---------+-----------+----------+--------------+ FV Prox  Full                                                        +---------+---------------+---------+-----------+----------+--------------+ FV Mid   Full                                                        +---------+---------------+---------+-----------+----------+--------------+ FV DistalFull                                                        +---------+---------------+---------+-----------+----------+--------------+ PFV      Full                                                        +---------+---------------+---------+-----------+----------+--------------+ POP      Full           Yes      Yes                                 +---------+---------------+---------+-----------+----------+--------------+  PTV      Full                                                        +---------+---------------+---------+-----------+----------+--------------+ PERO     None           No       No                   Acute          +---------+---------------+---------+-----------+----------+--------------+     Summary: RIGHT: - No evidence of common femoral vein obstruction.  LEFT: - Findings consistent with acute deep vein thrombosis  involving the left peroneal veins. - No cystic structure found in the popliteal fossa.  *See table(s) above for measurements and observations. Electronically signed by Servando Snare MD on 08/11/2020 at 5:30:50 PM.    Final      LOS: 1 day   Antonieta Pert, MD [facility]  08/13/2020, 1:43 PM

## 2020-08-13 NOTE — Plan of Care (Signed)
  Problem: Clinical Measurements: Goal: Respiratory complications will improve Outcome: Progressing   Problem: Clinical Measurements: Goal: Diagnostic test results will improve Outcome: Progressing   Problem: Coping: Goal: Level of anxiety will decrease Outcome: Progressing   Problem: Elimination: Goal: Will not experience complications related to bowel motility Outcome: Progressing

## 2020-08-14 ENCOUNTER — Encounter: Payer: Self-pay | Admitting: Oncology

## 2020-08-14 ENCOUNTER — Other Ambulatory Visit: Payer: Self-pay | Admitting: Oncology

## 2020-08-14 ENCOUNTER — Inpatient Hospital Stay (HOSPITAL_COMMUNITY): Payer: Medicare HMO

## 2020-08-14 DIAGNOSIS — I82452 Acute embolism and thrombosis of left peroneal vein: Secondary | ICD-10-CM | POA: Diagnosis not present

## 2020-08-14 LAB — BASIC METABOLIC PANEL
Anion gap: 12 (ref 5–15)
BUN: 27 mg/dL — ABNORMAL HIGH (ref 8–23)
CO2: 21 mmol/L — ABNORMAL LOW (ref 22–32)
Calcium: 8.6 mg/dL — ABNORMAL LOW (ref 8.9–10.3)
Chloride: 101 mmol/L (ref 98–111)
Creatinine, Ser: 0.96 mg/dL (ref 0.44–1.00)
GFR, Estimated: 56 mL/min — ABNORMAL LOW (ref >60)
Glucose, Bld: 145 mg/dL — ABNORMAL HIGH (ref 70–99)
Potassium: 4.7 mmol/L (ref 3.5–5.1)
Sodium: 134 mmol/L — ABNORMAL LOW (ref 135–145)

## 2020-08-14 LAB — CBC
HCT: 38.8 % (ref 36.0–46.0)
Hemoglobin: 12.2 g/dL (ref 12.0–15.0)
MCH: 26.8 pg (ref 26.0–34.0)
MCHC: 31.4 g/dL (ref 30.0–36.0)
MCV: 85.3 fL (ref 80.0–100.0)
Platelets: 434 10*3/uL — ABNORMAL HIGH (ref 150–400)
RBC: 4.55 MIL/uL (ref 3.87–5.11)
RDW: 13.9 % (ref 11.5–15.5)
WBC: 17.3 10*3/uL — ABNORMAL HIGH (ref 4.0–10.5)
nRBC: 0 % (ref 0.0–0.2)

## 2020-08-14 LAB — GRAM STAIN

## 2020-08-14 LAB — CA 125: Cancer Antigen (CA) 125: 344 U/mL — ABNORMAL HIGH (ref 0.0–38.1)

## 2020-08-14 MED ORDER — POLYETHYLENE GLYCOL 3350 17 G PO PACK
17.0000 g | PACK | Freq: Every day | ORAL | Status: DC
  Administered 2020-08-14 – 2020-08-18 (×4): 17 g via ORAL
  Filled 2020-08-14 (×5): qty 1

## 2020-08-14 MED ORDER — IOHEXOL 9 MG/ML PO SOLN
500.0000 mL | ORAL | Status: AC
  Administered 2020-08-14: 500 mL via ORAL

## 2020-08-14 MED ORDER — PROCHLORPERAZINE EDISYLATE 10 MG/2ML IJ SOLN
10.0000 mg | Freq: Four times a day (QID) | INTRAMUSCULAR | Status: AC | PRN
  Administered 2020-08-14: 10 mg via INTRAVENOUS
  Filled 2020-08-14: qty 2

## 2020-08-14 MED ORDER — PROMETHAZINE HCL 25 MG/ML IJ SOLN
12.5000 mg | Freq: Once | INTRAMUSCULAR | Status: AC
  Administered 2020-08-14: 12.5 mg via INTRAVENOUS
  Filled 2020-08-14: qty 1

## 2020-08-14 MED ORDER — IOHEXOL 9 MG/ML PO SOLN
ORAL | Status: AC
  Administered 2020-08-14: 500 mL via ORAL
  Filled 2020-08-14: qty 1000

## 2020-08-14 NOTE — Progress Notes (Signed)
Patients abdominal distention noted to have increased since this morning, c/o discomfort and pain to her lower back 6/10 gave PRN tramadol. Patient still has +2 pitting edema to her ankles and feet concerning her and her son in the room. Patient still unable to have a BM today with scheduled colace and miralax given this AM. Also c/o increased nausea, PRN zofran not yet due, MD does not want to order anything else at this time for nausea. Waiting for CT. paitent is attempting to intake contrast PO. MD notified, see new orders.

## 2020-08-14 NOTE — Progress Notes (Addendum)
PROGRESS NOTE    Brandi Bowen  TTS:[id number] DOB: Sep 19, 1941 DOA: 08/11/2020 PCP: [name], PA-C   Chief Complaint  Patient presents with  . Abdominal Pain   Brief Narrative: As per admitting MD: 79 y.o. female with history of breast cancer in remission has been recently experiencing abdominal distention which has progressed over the last 2 weeks.  Patient has been followed by GYN oncology [name] for post menopausal bleeding previously.  Recently patient had followed up with gynecologist and had MRI of the pelvis which showed large fibroids with ascites and was advised to follow with [name].  Since patient's abdominal girth got worse patient presents to the ER.  Patient also noted increasing swelling in the left lower extremity.  ED Course: In the ER patient had a CT abdomen pelvis which shows features concerning for peritoneal carcinomatosis with significant ascites and bilateral pleural effusion.  There is also concern for endometrial malignancy.  Dopplers of the lower extremity showed left peroneal DVT.  For which patient was started on heparin GTT.  Lab work show WBC count of 17.3 albumin 2.8 glucose 151.  Covid test was negative Patient was admitted and seen by oncology.  Subjective: Ongoing nausea No BM yet Son at bedside  Assessment & Plan:  New onset Ascites likely malignant/ with CT scan showing features concerning for peritoneal carcinomatosis, ascites bilateral pleural effusion and concern for endometrial malignancy.  Appreciate oncology inputs, s/p paracentesis 10/9 Light pink fluid removed 4.8 L. Cytology  Pending.Gram stain no growth.Seen by oncology and has follow-up with Dr. Alvy Bimler on 10/14 to discuss further treatment.CEA, CA 27.9 pending.  Constipation since 10/8: On stool softeneer,add MiraLAX.  check ct abdomen   Nausea- cont zofran  Bilateral pleural effusion likely due to #1 and low albumin third spacing: No shortness of breath, chest pain.  Asymptomatic.  Acute deep vein thrombosis (DVT) of left lower extremity: Heparin has been switched to Xarelto  Hypoalbuminemia, severely Albumin 2.8.  In the setting of #1.  Metabolic acidosis bicarb 21 on admission-resolved  Leukocytosis:could be reflective of #1/carcinomatosis, afebrile.  Wbc at 17.3  Hypertension: BP is stable.  Cont home atenolol.  Deconditioning continue to monitor.Increase activity.  Nutrition: Diet Order            Diet Heart Room service appropriate? Yes; Fluid consistency: Thin; Fluid restriction: 1500 mL Fluid  Diet effective now                DVT prophylaxis: iv heparin Code Status:   Code Status: Full Code  Family Communication: plan of care discussed with patient and her son at bedside.  Status is: Admitted as observation  Patient was changed to inpatient due to ongoing need for inpatient hospitalization for diagnostic and therapeutic work-up of her ascites, and management of ascites  Dispo: The patient is from: Home              Anticipated d/c is to: Home w/ [facility]              Anticipated d/c date is: 1-2 days              Patient currently is not medically stable to d/c.  Consultants:see note  Procedures:see note  Culture/Microbiology    Component Value Date/Time   SDES  08/12/2020 1402    PERITONEAL Performed at [facility] [address], [place], [place] [address]    SPECREQUEST  08/12/2020 1402    NONE Performed at  [facility], [place] [address], [place], [place] [address]    REPTSTATUS 08/14/2020 FINAL 08/12/2020 1402    Other culture-see note  Medications: Scheduled Meds: . atenolol  50 mg Oral QHS  . docusate sodium  100 mg Oral BID  . furosemide  20 mg Intravenous Q12H  . polyethylene glycol  17 g Oral Daily  . rivaroxaban  15 mg Oral BID WC   Followed by  . [START ON 09/03/2020] rivaroxaban  20 mg Oral Q breakfast   Continuous Infusions:   Antimicrobials: Anti-infectives  (From admission, onward)   None     Objective: Vitals: Today's Vitals   08/14/20 2114 08/15/20 0607 08/15/20 0802 08/15/20 1420  BP: 128/89 106/76  (!) 121/92  Pulse: 79 71  85  Resp: 17 18  18   Temp: 98.1 F (36.7 C) 97.7 F (36.5 C)  98.3 F (36.8 C)  TempSrc: Oral Oral  Oral  SpO2: 93% 93%  93%  Weight:      Height:      PainSc:   Asleep     Intake/Output Summary (Last 24 hours) at 08/15/2020 1533 Last data filed at 08/14/2020 1557 Gross per 24 hour  Intake 240 ml  Output --  Net 240 ml   Filed Weights   08/11/20 1833 08/11/20 2150  Weight: 81.6 kg 86.2 kg   Weight change:   Intake/Output from previous day: 10/11 0701 - 10/12 0700 In: 600 [P.O.:600] Out: -  Intake/Output this shift: No intake/output data recorded.  Examination: General exam: AAOx3,  weak appearing. HEENT:Oral mucosa moist, Ear/Nose WNL grossly, dentition normal. Respiratory system: bilaterally clear, diminished at base,no wheezing or crackles,no use of accessory muscle Cardiovascular system: S1 & S2 +, No JVD,. Gastrointestinal system: Abdomen soft,distended non tender, BS+ Nervous System:Alert, awake, moving extremities and grossly nonfocal Extremities: No edema, distal peripheral pulses palpable.  Skin: No rashes,no icterus. MSK: Normal muscle bulk,tone, power  Data Reviewed: I have personally reviewed following labs and imaging studies CBC: Recent Labs  Lab 08/11/20 1422 08/12/20 0721 08/13/20 0646 08/14/20 0603 08/15/20 0548  WBC 17.3* 13.6* 14.9* 17.3* 16.5*  HGB 13.4 11.4* 12.2 12.2 11.6*  HCT 42.3 36.5 38.2 38.8 35.8*  MCV 83.6 86.5 84.5 85.3 82.9  PLT 520* 407* 424* 434* 211*   Basic Metabolic Panel: Recent Labs  Lab 08/11/20 1422 08/13/20 0646 08/14/20 0603 08/15/20 0548  NA 138 135 134* 132*  K 4.4 4.3 4.7 4.4  CL 100 103 101 98  CO2 21* 22 21* 21*  GLUCOSE 151* 134* 145* 119*  BUN 26* 28* 27* 23  CREATININE 0.97 0.97 0.96 1.04*  CALCIUM 9.1 8.4* 8.6* 8.4*    GFR: Estimated Creatinine Clearance: 46.8 mL/min (A) (by C-G formula based on SCr of 1.04 mg/dL (H)). Liver Function Tests: Recent Labs  Lab 08/11/20 1422 08/12/20 0721  AST 21 16  ALT 14 10  ALKPHOS 138* 112  BILITOT 0.5 0.8  PROT 6.5 5.4*  ALBUMIN 2.8* 2.4*   Recent Labs  Lab 08/11/20 1422  LIPASE 25   No results for input(s): AMMONIA in the last 168 hours. Coagulation Profile: Recent Labs  Lab 08/11/20 1835  INR 1.3*   Cardiac Enzymes: No results for input(s): CKTOTAL, CKMB, CKMBINDEX, TROPONINI in the last 168 hours. BNP (last 3 results) No results for input(s): PROBNP in the last 8760 hours. HbA1C: No results for input(s): HGBA1C in the last 72 hours. CBG: No results for input(s): GLUCAP in the last 168 hours. Lipid  Profile: No results for input(s): CHOL, HDL, LDLCALC, TRIG, CHOLHDL, LDLDIRECT in the last 72 hours. Thyroid Function Tests: No results for input(s): TSH, T4TOTAL, FREET4, T3FREE, THYROIDAB in the last 72 hours. Anemia Panel: No results for input(s): VITAMINB12, FOLATE, FERRITIN, TIBC, IRON, RETICCTPCT in the last 72 hours. Sepsis Labs: No results for input(s): PROCALCITON, LATICACIDVEN in the last 168 hours.  Recent Results (from the past 240 hour(s))  Respiratory Panel by RT PCR (Flu A&B, Covid) - Nasopharyngeal Swab     Status: None   Collection Time: 08/11/20  6:40 PM   Specimen: Nasopharyngeal Swab  Result Value Ref Range Status   SARS Coronavirus 2 by RT PCR NEGATIVE NEGATIVE Final    Comment: (NOTE) SARS-CoV-2 target nucleic acids are NOT DETECTED.  The SARS-CoV-2 RNA is generally detectable in upper respiratoy specimens during the acute phase of infection. The lowest concentration of SARS-CoV-2 viral copies this assay can detect is 131 copies/mL. A negative result does not preclude SARS-Cov-2 infection and should not be used as the sole basis for treatment or other patient management decisions. A negative result may occur with   improper specimen collection/handling, submission of specimen other than nasopharyngeal swab, presence of viral mutation(s) within the areas targeted by this assay, and inadequate number of viral copies (<131 copies/mL). A negative result must be combined with clinical observations, patient history, and epidemiological information. The expected result is Negative.  Fact Sheet for Patients:  PinkCheek.be  Fact Sheet for Healthcare Providers:  GravelBags.it  This test is no t yet approved or cleared by the [place] FDA and  has been authorized for detection and/or diagnosis of SARS-CoV-2 by FDA under an Emergency Use Authorization (EUA). This EUA will remain  in effect (meaning this test can be used) for the duration of the COVID-19 declaration under Section 564(b)(1) of the Act, 21 U.S.C. section 360bbb-3(b)(1), unless the authorization is terminated or revoked sooner.     Influenza A by PCR NEGATIVE NEGATIVE Final   Influenza B by PCR NEGATIVE NEGATIVE Final    Comment: (NOTE) The Xpert Xpress SARS-CoV-2/FLU/RSV assay is intended as an aid in  the diagnosis of influenza from Nasopharyngeal swab specimens and  should not be used as a sole basis for treatment. Nasal washings and  aspirates are unacceptable for Xpert Xpress SARS-CoV-2/FLU/RSV  testing.  Fact Sheet for Patients: PinkCheek.be  Fact Sheet for Healthcare Providers: GravelBags.it  This test is not yet approved or cleared by the [place] FDA and  has been authorized for detection and/or diagnosis of SARS-CoV-2 by  FDA under an Emergency Use Authorization (EUA). This EUA will remain  in effect (meaning this test can be used) for the duration of the  Covid-19 declaration under Section 564(b)(1) of the Act, 21  U.S.C. section 360bbb-3(b)(1), unless the authorization is  terminated or  revoked. Performed at [facility], [place] [address] [place], [place] [address]   Gram stain     Status: None   Collection Time: 08/12/20  2:02 PM   Specimen: Peritoneal Washings  Result Value Ref Range Status   Specimen Description   Final    PERITONEAL Performed at [facility] [address], [place], [place] [address]    Special Requests   Final    NONE Performed at [facility], [place] [address] [place], [place] [address]    Gram Stain   Final    RARE WBC PRESENT, PREDOMINANTLY MONONUCLEAR NO ORGANISMS SEEN Performed at [facility], [place] [address], [place], [place] [address]    Report Status 08/14/2020 FINAL  Final     Radiology Studies: CT ABDOMEN PELVIS WO CONTRAST  Result Date: 08/14/2020 CLINICAL DATA:  Abdominal distension, history of ascites and recent paracentesis EXAM: CT ABDOMEN AND PELVIS WITHOUT CONTRAST TECHNIQUE: Multidetector CT imaging of the abdomen and pelvis was performed following the standard protocol without IV contrast. COMPARISON:  08/11/2020 FINDINGS: Lower chest: Lung bases demonstrate bilateral pleural effusions left considerably greater than right with lower lobe consolidation also left greater than right. Large hiatal hernia is seen. Hepatobiliary: No focal liver abnormality is seen. No gallstones, gallbladder wall thickening, or biliary dilatation. Pancreas: Scattered calcifications are noted consistent with chronic pancreatitis. Spleen: Normal in size without focal abnormality. Adrenals/Urinary Tract: Adrenal glands are unremarkable. Kidneys demonstrate nonobstructing stone in the lower pole of the right kidney stable from the previous exam. Left renal cyst is seen. No obstructive changes are noted. The bladder is decompressed. Stomach/Bowel: No obstructive or inflammatory changes of the colon are seen. The appendix is within normal limits. Small bowel and stomach are unremarkable aside from  the known hiatal hernia. Vascular/Lymphatic: Aortic atherosclerosis. No enlarged abdominal or pelvic lymph nodes. Reproductive: Uterus is significantly enlarged with multiple calcified uterine fibroids similar to that seen on prior exam. Persistent thickening of the endometrium in the fundus is noted. This is stable from the prior exam. Adnexa are not well visualized. Other: Ascites is identified less than that seen on the prior exam consistent with the recent interval paracentesis. Persistent peritoneal thickening is noted suggestive of carcinomatosis. This is less well appreciated on the current exam given the lack of IV contrast. Musculoskeletal: Degenerative changes of lumbar spine are noted IMPRESSION: Bilateral pleural effusions left greater than right which have increased in the interval from the recent exam. Associated lower lobe consolidation is noted. Ascites, improved from the prior exam consistent with the recent paracentesis. 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 Nonobstructing right renal stone. Multiple calcified uterine fibroids and thickening of the endometrium similar to that noted on the prior exam. Electronically Signed   By: Inez Catalina M.D.   On: 08/14/2020 20:15     LOS: 3 days   Antonieta Pert, MD [facility]  08/15/2020, 3:33 PM

## 2020-08-14 NOTE — Progress Notes (Unsigned)
COURTESY VISIT: I visited Brandi Bowen with her son present; I released her from breast cancer follow-up 2018. Her breast cancer history is summarized below. 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 We discussed the fact that my partner Dr Alvy Bimler has the most expertise on these cancers. Brandi Bowen is comfortable with being taken care of by the [facility] team and Dr Alvy Bimler. Further follow-up and treatment will be at their discretion.    Breast cancer history.   1. Status post right modified radical mastectomy on 06/619/2012 for an mpT4b pN1, stage IIIB invasive ductal carcinoma, grade 3, estrogen receptor positive at 95%, progesterone receptor positive at 70%, with an MIB-1 of 15% and no HER-2 amplification by FISH.  2. Status post 4 cycles of adjuvant chemotherapy with fluorouracil, epirubicin and cyclophosphamide from 05/10/2011 through 07/12/2011.   3. Status post 4 cycles of adjuvant docetaxel from 08/02/2011 through 10/04/2011.   4. Status post adjuvant radiation therapy from 11/18/2011 through 01/02/2012.   5. Started Arimidex 1 mg by mouth daily on 01/09/2012, later switched to exemestane, discontinued because of cost, started on letrozole February 2014.  Stopped May 2018     GM

## 2020-08-14 NOTE — Progress Notes (Signed)
Met with Brandi Bowen and her son on [place].  Discussed my role as Art therapist and provided her with the [employer] folder and a calendar of appointments.  Advised her of upcoming new patient appointment with Dr. Alvy Bimler on 08/17/20 at 2:00 (1:30 arrival at the [facility]) and to bring a family member.  Encouraged her to call with any questions or needs.

## 2020-08-15 ENCOUNTER — Encounter: Payer: Self-pay | Admitting: Hematology and Oncology

## 2020-08-15 DIAGNOSIS — I82452 Acute embolism and thrombosis of left peroneal vein: Secondary | ICD-10-CM | POA: Diagnosis not present

## 2020-08-15 DIAGNOSIS — C55 Malignant neoplasm of uterus, part unspecified: Secondary | ICD-10-CM | POA: Insufficient documentation

## 2020-08-15 LAB — CBC
HCT: 35.8 % — ABNORMAL LOW (ref 36.0–46.0)
Hemoglobin: 11.6 g/dL — ABNORMAL LOW (ref 12.0–15.0)
MCH: 26.9 pg (ref 26.0–34.0)
MCHC: 32.4 g/dL (ref 30.0–36.0)
MCV: 82.9 fL (ref 80.0–100.0)
Platelets: 409 10*3/uL — ABNORMAL HIGH (ref 150–400)
RBC: 4.32 MIL/uL (ref 3.87–5.11)
RDW: 13.7 % (ref 11.5–15.5)
WBC: 16.5 10*3/uL — ABNORMAL HIGH (ref 4.0–10.5)
nRBC: 0.3 % — ABNORMAL HIGH (ref 0.0–0.2)

## 2020-08-15 LAB — BASIC METABOLIC PANEL
Anion gap: 13 (ref 5–15)
BUN: 23 mg/dL (ref 8–23)
CO2: 21 mmol/L — ABNORMAL LOW (ref 22–32)
Calcium: 8.4 mg/dL — ABNORMAL LOW (ref 8.9–10.3)
Chloride: 98 mmol/L (ref 98–111)
Creatinine, Ser: 1.04 mg/dL — ABNORMAL HIGH (ref 0.44–1.00)
GFR, Estimated: 51 mL/min — ABNORMAL LOW (ref >60)
Glucose, Bld: 119 mg/dL — ABNORMAL HIGH (ref 70–99)
Potassium: 4.4 mmol/L (ref 3.5–5.1)
Sodium: 132 mmol/L — ABNORMAL LOW (ref 135–145)

## 2020-08-15 LAB — CYTOLOGY - NON PAP

## 2020-08-15 MED ORDER — LACTULOSE ENEMA
300.0000 mL | Freq: Once | ORAL | Status: DC
  Filled 2020-08-15: qty 300

## 2020-08-15 MED ORDER — MAGNESIUM CITRATE PO SOLN
1.0000 | Freq: Once | ORAL | Status: AC
  Administered 2020-08-15: 1 via ORAL
  Filled 2020-08-15: qty 296

## 2020-08-15 MED ORDER — BISACODYL 10 MG RE SUPP
10.0000 mg | Freq: Every day | RECTAL | Status: DC | PRN
  Administered 2020-08-15: 10 mg via RECTAL
  Filled 2020-08-15: qty 1

## 2020-08-15 MED ORDER — FUROSEMIDE 20 MG PO TABS: 20.0000 mg | ORAL_TABLET | Freq: Every day | ORAL | Status: DC

## 2020-08-15 MED ORDER — FUROSEMIDE 10 MG/ML IJ SOLN
20.0000 mg | Freq: Two times a day (BID) | INTRAMUSCULAR | Status: DC
  Administered 2020-08-15 – 2020-08-17 (×5): 20 mg via INTRAVENOUS
  Filled 2020-08-15 (×5): qty 2

## 2020-08-15 MED ORDER — PROCHLORPERAZINE EDISYLATE 10 MG/2ML IJ SOLN
10.0000 mg | Freq: Four times a day (QID) | INTRAMUSCULAR | Status: AC | PRN
  Administered 2020-08-15 – 2020-08-16 (×2): 10 mg via INTRAVENOUS
  Filled 2020-08-15 (×2): qty 2

## 2020-08-15 MED ORDER — FUROSEMIDE 10 MG/ML IJ SOLN: 20.0000 mg | Freq: Once | INTRAMUSCULAR | Status: DC

## 2020-08-15 NOTE — Progress Notes (Addendum)
PROGRESS NOTE    Brandi Bowen  TFT:[id number] DOB: 1941-11-02 DOA: 08/11/2020 PCP: [name], PA-C   Chief Complaint  Patient presents with  . Abdominal Pain  Brief Narrative: As per admitting MD: 79 y.o.femalewithhistory of breast cancer in remission has been recently experiencing abdominal distention which has progressed over the last 2 weeks. Patient has been followed by GYN oncology [name] for post menopausal bleeding previously. Recently patient had followed up with gynecologist and had MRI of the pelvis which showed large fibroids with ascites and was advised to follow with [name]. Since patient's abdominal girth got worse patient presents to the ER. Patient also noted increasing swelling in the left lower extremity.  ED Course:In the ER patient had a CT abdomen pelvis which shows features concerning for peritoneal carcinomatosis with significant ascites and bilateral pleural effusion. There is also concern for endometrial malignancy. Dopplers of the lower extremity showed left peroneal DVT. For which patient was started on heparin GTT. Lab work show WBC count of 17.3 albumin 2.8 glucose 151. Covid test was negative Patient was admitted and seen by oncology.  Subjective: No chest pain shortness of breath. Voided. But no BM yet. Unsure abt flatus/not passing Ate well. Tried [employer] and vomited after drinking it Son at bedside  Assessment & Plan:  New onset Ascites likely malignant/ with CT scan showing features concerning for peritoneal carcinomatosis, ascites bilateral pleural effusion and concern for endometrial malignancy.  Appreciate oncology inputs, s/p paracentesis 10/9 Light pink fluid removed 4.8 L. Cytology negative for malignancy. . Gram stain no growth.  Seen by oncology and has follow-up with Dr. Alvy Bimler on 10/14 to discuss further treatment.CEA, CA 27.9 pending.  Will start on diuresis.  Constipation since 10/8: On stool softeneer,had  added MiraLAX.  No improvement.  In the setting of pain medication use also from her abnormal CT scan finding ascites.Rechecked CT abd pelvis:"Ascites, improved from the prior exam consistent with the recent paracentesis. 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  No obstructive or inflammatory changes in bowel,Multiple calcified uterine fibroids and thickening of the endometrium" did not tolerate dissected as she vomited after that.  We will try Dulcolax suppository if fails try tap water enema.Did eat this morning.  Nausea/vomiting after taking magcitrate vomited. Has been nauseous. likley from above. continue supportive measures continue constipation management as above.   Bilateral pleural effusion likely due to #1 and low albumin third spacing: No shortness of breath, chest pain. Asymptomatic, not needing oxygen.  Letter.  Acute deep vein thrombosis (DVT) of left lower extremity: Heparin has been switched to Xarelto  Hypoalbuminemia, severely Albumin 2.8.  In the setting of #1.  Metabolic acidosis bicarb 21 on admission-resolved  Leukocytosis:could be reflective of #1/carcinomatosis, afebrile.  Monitor  Hypertension: BP is stable.  Cont home atenolol.  Deconditioning continue to monitor.Increase activity.  Nutrition: Diet Order            Diet Heart Room service appropriate? Yes; Fluid consistency: Thin; Fluid restriction: 1500 mL Fluid  Diet effective now                 Body mass index is 31.62 kg/m.  DVT prophylaxis: XARELTO Code Status:   Code Status: Full Code  Family Communication: plan of care discussed with patient at bedside.  Status is: Inpatient  Remains inpatient appropriate because:ongoing nausea, constipation, ascites   Dispo: The patient is from: Home  Anticipated d/c is to: Home              Anticipated d/c date is: 1 day              Patient currently is not medically stable to d/c.          Consultants:see note  Procedures:see note  Culture/Microbiology    Component Value Date/Time   SDES  08/12/2020 1402    PERITONEAL Performed at [facility] [address], [place], [place] [address]    SPECREQUEST  08/12/2020 1402    NONE Performed at [facility], [place] [address] [place], [place] [address]    REPTSTATUS 08/14/2020 FINAL 08/12/2020 1402    Other culture-see note  Medications: Scheduled Meds: . atenolol  50 mg Oral QHS  . docusate sodium  100 mg Oral BID  . furosemide  20 mg Intravenous Q12H  . polyethylene glycol  17 g Oral Daily  . rivaroxaban  15 mg Oral BID WC   Followed by  . [START ON 09/03/2020] rivaroxaban  20 mg Oral Q breakfast   Continuous Infusions:  Antimicrobials: Anti-infectives (From admission, onward)   None     Objective: Vitals: Today's Vitals   08/14/20 2114 08/15/20 0607 08/15/20 0802 08/15/20 1420  BP: 128/89 106/76  (!) 121/92  Pulse: 79 71  85  Resp: 17 18  18   Temp: 98.1 F (36.7 C) 97.7 F (36.5 C)  98.3 F (36.8 C)  TempSrc: Oral Oral  Oral  SpO2: 93% 93%  93%  Weight:      Height:      PainSc:   Asleep    No intake or output data in the 24 hours ending 08/15/20 1626 Filed Weights   08/11/20 1833 08/11/20 2150  Weight: 81.6 kg 86.2 kg   Weight change:   Intake/Output from previous day: 10/11 0701 - 10/12 0700 In: 600 [P.O.:600] Out: -  Intake/Output this shift: No intake/output data recorded.  Examination: General exam: AAOx3, on bedside recliner NAD, weak appearing. HEENT:Oral mucosa moist, Ear/Nose WNL grossly, dentition normal. Respiratory system: bilaterally clear, diminished at base,no wheezing or crackles,no use of accessory muscle Cardiovascular system: S1 & S2 +, No JVD,. Gastrointestinal system: Abdomen soft,distended non tender, BS+ Nervous System:Alert, awake, moving extremities and grossly nonfocal Extremities: No edema, distal peripheral pulses  palpable.  Skin: No rashes,no icterus. MSK: Normal muscle bulk,tone, power  Data Reviewed: I have personally reviewed following labs and imaging studies CBC: Recent Labs  Lab 08/11/20 1422 08/12/20 0721 08/13/20 0646 08/14/20 0603 08/15/20 0548  WBC 17.3* 13.6* 14.9* 17.3* 16.5*  HGB 13.4 11.4* 12.2 12.2 11.6*  HCT 42.3 36.5 38.2 38.8 35.8*  MCV 83.6 86.5 84.5 85.3 82.9  PLT 520* 407* 424* 434* 130*   Basic Metabolic Panel: Recent Labs  Lab 08/11/20 1422 08/13/20 0646 08/14/20 0603 08/15/20 0548  NA 138 135 134* 132*  K 4.4 4.3 4.7 4.4  CL 100 103 101 98  CO2 21* 22 21* 21*  GLUCOSE 151* 134* 145* 119*  BUN 26* 28* 27* 23  CREATININE 0.97 0.97 0.96 1.04*  CALCIUM 9.1 8.4* 8.6* 8.4*   GFR: Estimated Creatinine Clearance: 46.8 mL/min (A) (by C-G formula based on SCr of 1.04 mg/dL (H)). Liver Function Tests: Recent Labs  Lab 08/11/20 1422 08/12/20 0721  AST 21 16  ALT 14 10  ALKPHOS 138* 112  BILITOT 0.5 0.8  PROT 6.5 5.4*  ALBUMIN 2.8* 2.4*   Recent Labs  Lab 08/11/20 1422  LIPASE 25   No results for input(s): AMMONIA in the last 168 hours. Coagulation Profile: Recent Labs  Lab 08/11/20 1835  INR 1.3*   Cardiac Enzymes: No results for input(s): CKTOTAL, CKMB, CKMBINDEX, TROPONINI in the last 168 hours. BNP (last 3 results) No results for input(s): PROBNP in the last 8760 hours. HbA1C: No results for input(s): HGBA1C in the last 72 hours. CBG: No results for input(s): GLUCAP in the last 168 hours. Lipid Profile: No results for input(s): CHOL, HDL, LDLCALC, TRIG, CHOLHDL, LDLDIRECT in the last 72 hours. Thyroid Function Tests: No results for input(s): TSH, T4TOTAL, FREET4, T3FREE, THYROIDAB in the last 72 hours. Anemia Panel: No results for input(s): VITAMINB12, FOLATE, FERRITIN, TIBC, IRON, RETICCTPCT in the last 72 hours. Sepsis Labs: No results for input(s): PROCALCITON, LATICACIDVEN in the last 168 hours.  Recent Results (from the past 240  hour(s))  Respiratory Panel by RT PCR (Flu A&B, Covid) - Nasopharyngeal Swab     Status: None   Collection Time: 08/11/20  6:40 PM   Specimen: Nasopharyngeal Swab  Result Value Ref Range Status   SARS Coronavirus 2 by RT PCR NEGATIVE NEGATIVE Final    Comment: (NOTE) SARS-CoV-2 target nucleic acids are NOT DETECTED.  The SARS-CoV-2 RNA is generally detectable in upper respiratoy specimens during the acute phase of infection. The lowest concentration of SARS-CoV-2 viral copies this assay can detect is 131 copies/mL. A negative result does not preclude SARS-Cov-2 infection and should not be used as the sole basis for treatment or other patient management decisions. A negative result may occur with  improper specimen collection/handling, submission of specimen other than nasopharyngeal swab, presence of viral mutation(s) within the areas targeted by this assay, and inadequate number of viral copies (<131 copies/mL). A negative result must be combined with clinical observations, patient history, and epidemiological information. The expected result is Negative.  Fact Sheet for Patients:  PinkCheek.be  Fact Sheet for Healthcare Providers:  GravelBags.it  This test is no t yet approved or cleared by the [place] FDA and  has been authorized for detection and/or diagnosis of SARS-CoV-2 by FDA under an Emergency Use Authorization (EUA). This EUA will remain  in effect (meaning this test can be used) for the duration of the COVID-19 declaration under Section 564(b)(1) of the Act, 21 U.S.C. section 360bbb-3(b)(1), unless the authorization is terminated or revoked sooner.     Influenza A by PCR NEGATIVE NEGATIVE Final   Influenza B by PCR NEGATIVE NEGATIVE Final    Comment: (NOTE) The Xpert Xpress SARS-CoV-2/FLU/RSV assay is intended as an aid in  the diagnosis of influenza from Nasopharyngeal swab specimens and  should not  be used as a sole basis for treatment. Nasal washings and  aspirates are unacceptable for Xpert Xpress SARS-CoV-2/FLU/RSV  testing.  Fact Sheet for Patients: PinkCheek.be  Fact Sheet for Healthcare Providers: GravelBags.it  This test is not yet approved or cleared by the [place] FDA and  has been authorized for detection and/or diagnosis of SARS-CoV-2 by  FDA under an Emergency Use Authorization (EUA). This EUA will remain  in effect (meaning this test can be used) for the duration of the  Covid-19 declaration under Section 564(b)(1) of the Act, 21  U.S.C. section 360bbb-3(b)(1), unless the authorization is  terminated or revoked. Performed at [facility], [place] [address] [place], [place] [address]   Gram stain     Status: None   Collection Time: 08/12/20  2:02  PM   Specimen: Peritoneal Washings  Result Value Ref Range Status   Specimen Description   Final    PERITONEAL Performed at [facility] [address], [place], [place] [address]    Special Requests   Final    NONE Performed at [facility], [place] [address], [place], [place] [address]    Gram Stain   Final    RARE WBC PRESENT, PREDOMINANTLY MONONUCLEAR NO ORGANISMS SEEN Performed at [facility], [place] [address] [place], [place] [address]    Report Status 08/14/2020 FINAL  Final     Radiology Studies: CT ABDOMEN PELVIS WO CONTRAST  Result Date: 08/14/2020 CLINICAL DATA:  Abdominal distension, history of ascites and recent paracentesis EXAM: CT ABDOMEN AND PELVIS WITHOUT CONTRAST TECHNIQUE: Multidetector CT imaging of the abdomen and pelvis was performed following the standard protocol without IV contrast. COMPARISON:  08/11/2020 FINDINGS: Lower chest: Lung bases demonstrate bilateral pleural effusions left considerably greater than right with lower lobe consolidation also left greater than  right. Large hiatal hernia is seen. Hepatobiliary: No focal liver abnormality is seen. No gallstones, gallbladder wall thickening, or biliary dilatation. Pancreas: Scattered calcifications are noted consistent with chronic pancreatitis. Spleen: Normal in size without focal abnormality. Adrenals/Urinary Tract: Adrenal glands are unremarkable. Kidneys demonstrate nonobstructing stone in the lower pole of the right kidney stable from the previous exam. Left renal cyst is seen. No obstructive changes are noted. The bladder is decompressed. Stomach/Bowel: No obstructive or inflammatory changes of the colon are seen. The appendix is within normal limits. Small bowel and stomach are unremarkable aside from the known hiatal hernia. Vascular/Lymphatic: Aortic atherosclerosis. No enlarged abdominal or pelvic lymph nodes. Reproductive: Uterus is significantly enlarged with multiple calcified uterine fibroids similar to that seen on prior exam. Persistent thickening of the endometrium in the fundus is noted. This is stable from the prior exam. Adnexa are not well visualized. Other: Ascites is identified less than that seen on the prior exam consistent with the recent interval paracentesis. Persistent peritoneal thickening is noted suggestive of carcinomatosis. This is less well appreciated on the current exam given the lack of IV contrast. Musculoskeletal: Degenerative changes of lumbar spine are noted IMPRESSION: Bilateral pleural effusions left greater than right which have increased in the interval from the recent exam. Associated lower lobe consolidation is noted. Ascites, improved from the prior exam consistent with the recent paracentesis. 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 Nonobstructing right renal stone. Multiple calcified uterine fibroids and thickening of the endometrium similar to that noted on the prior exam. Electronically Signed   By: Inez Catalina M.D.   On: 08/14/2020 20:15     LOS: 3 days   Antonieta Pert, MD [facility]  08/15/2020, 4:26 PM

## 2020-08-15 NOTE — Care Management Important Message (Signed)
Important Message  Patient Details IM Letter given to the Patient Name: Brandi Bowen MRN: [id number] Date of Birth: [birth year]   Medicare Important Message Given:  Yes     Kerin Salen 08/15/2020, 11:05 AM

## 2020-08-16 ENCOUNTER — Encounter: Payer: Self-pay | Admitting: Oncology

## 2020-08-16 ENCOUNTER — Inpatient Hospital Stay (HOSPITAL_COMMUNITY): Payer: Medicare HMO

## 2020-08-16 DIAGNOSIS — I82452 Acute embolism and thrombosis of left peroneal vein: Secondary | ICD-10-CM | POA: Diagnosis not present

## 2020-08-16 DIAGNOSIS — D219 Benign neoplasm of connective and other soft tissue, unspecified: Secondary | ICD-10-CM

## 2020-08-16 LAB — COMPREHENSIVE METABOLIC PANEL
ALT: 12 U/L (ref 0–44)
AST: 18 U/L (ref 15–41)
Albumin: 2.5 g/dL — ABNORMAL LOW (ref 3.5–5.0)
Alkaline Phosphatase: 184 U/L — ABNORMAL HIGH (ref 38–126)
Anion gap: 16 — ABNORMAL HIGH (ref 5–15)
BUN: 30 mg/dL — ABNORMAL HIGH (ref 8–23)
CO2: 25 mmol/L (ref 22–32)
Calcium: 8.9 mg/dL (ref 8.9–10.3)
Chloride: 91 mmol/L — ABNORMAL LOW (ref 98–111)
Creatinine, Ser: 1.43 mg/dL — ABNORMAL HIGH (ref 0.44–1.00)
GFR, Estimated: 35 mL/min — ABNORMAL LOW (ref >60)
Glucose, Bld: 160 mg/dL — ABNORMAL HIGH (ref 70–99)
Potassium: 4.6 mmol/L (ref 3.5–5.1)
Sodium: 132 mmol/L — ABNORMAL LOW (ref 135–145)
Total Bilirubin: 0.8 mg/dL (ref 0.3–1.2)
Total Protein: 6.3 g/dL — ABNORMAL LOW (ref 6.5–8.1)

## 2020-08-16 LAB — CBC
HCT: 40 % (ref 36.0–46.0)
Hemoglobin: 12.8 g/dL (ref 12.0–15.0)
MCH: 26.4 pg (ref 26.0–34.0)
MCHC: 32 g/dL (ref 30.0–36.0)
MCV: 82.5 fL (ref 80.0–100.0)
Platelets: 528 10*3/uL — ABNORMAL HIGH (ref 150–400)
RBC: 4.85 MIL/uL (ref 3.87–5.11)
RDW: 13.9 % (ref 11.5–15.5)
WBC: 20.2 10*3/uL — ABNORMAL HIGH (ref 4.0–10.5)
nRBC: 0 % (ref 0.0–0.2)

## 2020-08-16 LAB — CEA: CEA: 1.1 ng/mL (ref 0.0–4.7)

## 2020-08-16 LAB — CANCER ANTIGEN 27.29: CA 27.29: 25.6 U/mL (ref 0.0–38.6)

## 2020-08-16 MED ORDER — HEPARIN (PORCINE) 25000 UT/250ML-% IV SOLN
1150.0000 [IU]/h | INTRAVENOUS | Status: DC
  Administered 2020-08-16: 1150 [IU]/h via INTRAVENOUS
  Filled 2020-08-16 (×2): qty 250

## 2020-08-16 MED ORDER — RIVAROXABAN 15 MG PO TABS
15.0000 mg | ORAL_TABLET | Freq: Two times a day (BID) | ORAL | 0 refills | Status: DC
Start: 2020-08-16 — End: 2020-08-24

## 2020-08-16 MED ORDER — RIVAROXABAN 20 MG PO TABS
20.0000 mg | ORAL_TABLET | Freq: Every day | ORAL | 2 refills | Status: DC
Start: 2020-09-03 — End: 2020-08-24

## 2020-08-16 MED ORDER — MECLIZINE HCL 25 MG PO CHEW: 1.0000 | CHEWABLE_TABLET | Freq: Every day | ORAL | 0 refills | Status: AC | PRN

## 2020-08-16 NOTE — Assessment & Plan Note (Addendum)
-  Large fibroid noted on CT abdomen/pelvis.  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  Patient will be seen by oncology instead given new pathology results

## 2020-08-16 NOTE — Progress Notes (Addendum)
[place] for Heparin  Indication: DVT  Allergies  Allergen Reactions  . Shrimp [Shellfish Allergy]     Patient Measurements: Height: 5\' 5"  (165.1 cm) Weight: 86.2 kg (190 lb 0.6 oz) IBW/kg (Calculated) : 57 Heparin Dosing Weight: 76 kg  Vital Signs: Temp: 97.8 F (36.6 C) (10/13 0605) Temp Source: Oral (10/13 0605) BP: 90/51 (10/13 0607) Pulse Rate: 78 (10/13 0607)  Labs: Recent Labs    08/14/20 0603 08/14/20 0603 08/15/20 0548 08/16/20 0532  HGB 12.2   < > 11.6* 12.8  HCT 38.8  --  35.8* 40.0  PLT 434*  --  409* 528*  CREATININE 0.96  --  1.04* 1.43*   < > = values in this interval not displayed.    Estimated Creatinine Clearance: 34 mL/min (A) (by C-G formula based on SCr of 1.43 mg/dL (H)).   Medical History: Past Medical History:  Diagnosis Date  . Breast cancer ([place])   . Cancer ([place])    rt breast ca  . Hydronephrosis   . Hyperlipidemia   . Hypothyroidism   . Uterine leiomyoma   . Vitamin D deficiency 02/25/2012    Medications:  Initially transitioned to Xarelto 15 mg bid >> 20 mg daily, last dose of 15 mg 10/13 @ 0746  Assessment: [name] admitted with new-onset ascites, likely malignant per MD, with acute DVT. Patient was initiated on heparin infusion then transitioned to Xarelto. Today SCr has significantly bumped. Patient has been receiving Lasix. Pharmacy consulted to transition back to heparin infusion due to planned biopsy.  08/16/20 12:03 PM   CBC stable, wnl  SCr significantly increased 1.04 > 1.43  Goal of Therapy:  Heparin level 0.3-0.7 units/ml aPTT 66-102 seconds Monitor platelets by anticoagulation protocol: Yes   Plan:  Will start heparin infusion about 12 hours after last Xarelto dose at 2000 Will check aPTT 8 hours after initiating infusion Monitor using aPTT until correlation with HL Daily CBC and HL  Jannely Henthorn D 08/16/2020,11:59 AM

## 2020-08-16 NOTE — Progress Notes (Signed)
PROGRESS NOTE    Brandi Bowen   BWI:[id number]  DOB: Jan 19, 1941  DOA: 08/11/2020     4  PCP: Brandi Ginger, PA-C  CC: abdominal distension  Hospital Course: [name] is an 79 yo CF with PMH uterine mass (u/k etiology currently), hypothyroidism, hyperlipidemia who presented to the hospital with abdominal distention.  She also has a known history of breast cancer in remission. She has recently been followed by GYN oncology for postmenopausal bleeding and underwent imaging studies which have shown large fibroids with ascites. Imaging studies performed in the ER were concerning for possible peritoneal carcinomatosis and possible underlying endometrial malignancy.  She also underwent duplex which revealed left peroneal DVT and was started on anticoagulation, transitioned to Xarelto and back to heparin drip on 08/16/2020. She previously underwent a paracentesis on 08/12/2020 which removed 4.8 L of peritoneal fluid however cytology was nondiagnostic.  She was evaluated by GYN oncology during hospitalization and recommended for tissue biopsy for diagnostic purposes.  Tentatively plan is for CT-guided percutaneous biopsy on 08/18/2020.  If unsuccessful or nondiagnostic, patient may still need a surgical biopsy (in which case may need discontinuation of anticoagulation and placement of IVC filter).   Interval History:  No events overnight.  Sitting up in chair bedside this morning comfortably in no distress. Had been on Xarelto but was changed to heparin drip later today in anticipation of possible biopsy on Friday.  Old records reviewed in assessment of this patient  ROS: Constitutional: negative for chills and fevers, Respiratory: negative for cough, Cardiovascular: negative for chest pain and Gastrointestinal: positive for Abdominal distention  Assessment & Plan: * Fibroids -Large fibroid noted on CT abdomen/pelvis.  There is high suspicion for underlying malignancy -Tentative plan is  for tissue biopsy on 08/18/2020 with CT-guided biopsy in IR -Being followed by GYN oncology during hospitalization, appreciate assistance  Acute deep vein thrombosis (DVT) of left lower extremity ([place]) - left peroneal vein - likely provoked in setting of underlying suspected malignancy - transitioned to xarelto but now back to heparin gtt on 10/13 in anticipation of CT biopsy on 10/15 - d/c heparin drip morning of 10/15  Ascites -Paracentesis performed on 08/12/2020 that was nondiagnostic   Antimicrobials: None  DVT prophylaxis: Heparin drip Code Status: Full Family Communication: none present this am Disposition Plan: Status is: Inpatient  Remains inpatient appropriate because:Ongoing diagnostic testing needed not appropriate for outpatient work up, Unsafe d/c plan, IV treatments appropriate due to intensity of illness or inability to take PO and Inpatient level of care appropriate due to severity of illness   Dispo: The patient is from: Home              Anticipated d/c is to: Home              Anticipated d/c date is: 3 days              Patient currently is not medically stable to d/c.       Objective: Blood pressure 116/89, pulse 78, temperature 97.8 F (36.6 C), temperature source Oral, resp. rate 17, height 5\' 5"  (1.651 m), weight 86.2 kg, SpO2 97 %.  Examination: General appearance: alert, cooperative and no distress Head: Normocephalic, without obvious abnormality, atraumatic Eyes: EOMI Lungs: clear to auscultation bilaterally Heart: regular rate and rhythm and S1, S2 normal Abdomen: distended, BS present Extremities: 1-2+ LE edema Skin: mobility and turgor normal Neurologic: Grossly normal  Consultants:   GYN onc  IR  Procedures:  n/a  Data Reviewed: I have personally reviewed following labs and imaging studies Results for orders placed or performed during the hospital encounter of 08/11/20 (from the past 24 hour(s))  CBC     Status: Abnormal    Collection Time: 08/16/20  5:32 AM  Result Value Ref Range   WBC 20.2 (H) 4.0 - 10.5 K/uL   RBC 4.85 3.87 - 5.11 MIL/uL   Hemoglobin 12.8 12.0 - 15.0 g/dL   HCT 40.0 36 - 46 %   MCV 82.5 80.0 - 100.0 fL   MCH 26.4 26.0 - 34.0 pg   MCHC 32.0 30.0 - 36.0 g/dL   RDW 13.9 11.5 - 15.5 %   Platelets 528 (H) 150 - 400 K/uL   nRBC 0.0 0.0 - 0.2 %  Comprehensive metabolic panel     Status: Abnormal   Collection Time: 08/16/20  5:32 AM  Result Value Ref Range   Sodium 132 (L) 135 - 145 mmol/L   Potassium 4.6 3.5 - 5.1 mmol/L   Chloride 91 (L) 98 - 111 mmol/L   CO2 25 22 - 32 mmol/L   Glucose, Bld 160 (H) 70 - 99 mg/dL   BUN 30 (H) 8 - 23 mg/dL   Creatinine, Ser 1.43 (H) 0.44 - 1.00 mg/dL   Calcium 8.9 8.9 - 10.3 mg/dL   Total Protein 6.3 (L) 6.5 - 8.1 g/dL   Albumin 2.5 (L) 3.5 - 5.0 g/dL   AST 18 15 - 41 U/L   ALT 12 0 - 44 U/L   Alkaline Phosphatase 184 (H) 38 - 126 U/L   Total Bilirubin 0.8 0.3 - 1.2 mg/dL   GFR, Estimated 35 (L) >60 mL/min   Anion gap 16 (H) 5 - 15    Recent Results (from the past 240 hour(s))  Respiratory Panel by RT PCR (Flu A&B, Covid) - Nasopharyngeal Swab     Status: None   Collection Time: 08/11/20  6:40 PM   Specimen: Nasopharyngeal Swab  Result Value Ref Range Status   SARS Coronavirus 2 by RT PCR NEGATIVE NEGATIVE Final    Comment: (NOTE) SARS-CoV-2 target nucleic acids are NOT DETECTED.  The SARS-CoV-2 RNA is generally detectable in upper respiratoy specimens during the acute phase of infection. The lowest concentration of SARS-CoV-2 viral copies this assay can detect is 131 copies/mL. A negative result does not preclude SARS-Cov-2 infection and should not be used as the sole basis for treatment or other patient management decisions. A negative result may occur with  improper specimen collection/handling, submission of specimen other than nasopharyngeal swab, presence of viral mutation(s) within the areas targeted by this assay, and inadequate  number of viral copies (<131 copies/mL). A negative result must be combined with clinical observations, patient history, and epidemiological information. The expected result is Negative.  Fact Sheet for Patients:  PinkCheek.be  Fact Sheet for Healthcare Providers:  GravelBags.it  This test is no t yet approved or cleared by the [place] FDA and  has been authorized for detection and/or diagnosis of SARS-CoV-2 by FDA under an Emergency Use Authorization (EUA). This EUA will remain  in effect (meaning this test can be used) for the duration of the COVID-19 declaration under Section 564(b)(1) of the Act, 21 U.S.C. section 360bbb-3(b)(1), unless the authorization is terminated or revoked sooner.     Influenza A by PCR NEGATIVE NEGATIVE Final   Influenza B by PCR NEGATIVE NEGATIVE Final    Comment: (NOTE) The Xpert Xpress SARS-CoV-2/FLU/RSV assay is intended as  an aid in  the diagnosis of influenza from Nasopharyngeal swab specimens and  should not be used as a sole basis for treatment. Nasal washings and  aspirates are unacceptable for Xpert Xpress SARS-CoV-2/FLU/RSV  testing.  Fact Sheet for Patients: PinkCheek.be  Fact Sheet for Healthcare Providers: GravelBags.it  This test is not yet approved or cleared by the [place] FDA and  has been authorized for detection and/or diagnosis of SARS-CoV-2 by  FDA under an Emergency Use Authorization (EUA). This EUA will remain  in effect (meaning this test can be used) for the duration of the  Covid-19 declaration under Section 564(b)(1) of the Act, 21  U.S.C. section 360bbb-3(b)(1), unless the authorization is  terminated or revoked. Performed at [facility], [place] [address] [place], [place] [address]   Gram stain     Status: None   Collection Time: 08/12/20  2:02 PM   Specimen: Peritoneal  [place]  Result Value Ref Range Status   Specimen Description   Final    PERITONEAL Performed at [facility] [address], [place], [place] [address]    Special Requests   Final    NONE Performed at [facility], [place] [address] [place], [place] [address]    Gram Stain   Final    RARE WBC PRESENT, PREDOMINANTLY MONONUCLEAR NO ORGANISMS SEEN Performed at [facility], [place] [address], [place], [place] [address]    Report Status 08/14/2020 FINAL  Final     Radiology Studies: CT ABDOMEN PELVIS WO CONTRAST  Result Date: 08/14/2020 CLINICAL DATA:  Abdominal distension, history of ascites and recent paracentesis EXAM: CT ABDOMEN AND PELVIS WITHOUT CONTRAST TECHNIQUE: Multidetector CT imaging of the abdomen and pelvis was performed following the standard protocol without IV contrast. COMPARISON:  08/11/2020 FINDINGS: Lower chest: Lung bases demonstrate bilateral pleural effusions left considerably greater than right with lower lobe consolidation also left greater than right. Large hiatal hernia is seen. Hepatobiliary: No focal liver abnormality is seen. No gallstones, gallbladder wall thickening, or biliary dilatation. Pancreas: Scattered calcifications are noted consistent with chronic pancreatitis. Spleen: Normal in size without focal abnormality. Adrenals/Urinary Tract: Adrenal glands are unremarkable. Kidneys demonstrate nonobstructing stone in the lower pole of the right kidney stable from the previous exam. Left renal cyst is seen. No obstructive changes are noted. The bladder is decompressed. Stomach/Bowel: No obstructive or inflammatory changes of the colon are seen. The appendix is within normal limits. Small bowel and stomach are unremarkable aside from the known hiatal hernia. Vascular/Lymphatic: Aortic atherosclerosis. No enlarged abdominal or pelvic lymph nodes. Reproductive: Uterus is significantly enlarged with multiple calcified uterine  fibroids similar to that seen on prior exam. Persistent thickening of the endometrium in the fundus is noted. This is stable from the prior exam. Adnexa are not well visualized. Other: Ascites is identified less than that seen on the prior exam consistent with the recent interval paracentesis. Persistent peritoneal thickening is noted suggestive of carcinomatosis. This is less well appreciated on the current exam given the lack of IV contrast. Musculoskeletal: Degenerative changes of lumbar spine are noted IMPRESSION: Bilateral pleural effusions left greater than right which have increased in the interval from the recent exam. Associated lower lobe consolidation is noted. Ascites, improved from the prior exam consistent with the recent paracentesis. 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 Nonobstructing right renal stone. Multiple calcified uterine fibroids and thickening of the endometrium similar to that noted on the prior exam. Electronically  Signed   By: Inez Catalina M.D.   On: 08/14/2020 20:15   DG Abd 1 View  Result Date: 08/16/2020 CLINICAL DATA:  Constipation. EXAM: ABDOMEN - 1 VIEW COMPARISON:  None. FINDINGS: The bowel gas pattern is normal. Mild amount of residual contrast and stool is seen throughout the colon. No radio-opaque calculi or other significant radiographic abnormality are seen. IMPRESSION: No evidence of bowel obstruction or ileus. Electronically Signed   By: Marijo Conception M.D.   On: 08/16/2020 08:17   DG Abd 1 View  Final Result    CT ABDOMEN PELVIS WO CONTRAST  Final Result    US Paracentesis  Final Result    VAS [place] LOWER EXTREMITY VENOUS (DVT) (ONLY [facility] & [facility] 7a-7p)  Final Result    DG Chest 2 View  Final Result    CT Abdomen Pelvis W Contrast  Final Result    IR Radiologist Eval & Mgmt    (Results Pending)    Scheduled Meds: . atenolol  50 mg Oral QHS  . docusate sodium  100 mg Oral BID  . furosemide  20 mg Intravenous Q12H   . lactulose  300 mL Rectal Once  . polyethylene glycol  17 g Oral Daily   PRN Meds: bisacodyl, ondansetron (ZOFRAN) IV, polyethylene glycol, traMADol Continuous Infusions: . heparin        LOS: 4 days  Time spent: Greater than 50% of the 35 minute visit was spent in counseling/coordination of care for the patient as laid out in the A&P.   Dwyane Dee, MD [facility] 08/16/2020, 6:10 PM

## 2020-08-16 NOTE — Consult Note (Signed)
Chief Complaint: Patient was seen in consultation today for  Chief Complaint  Patient presents with  . Abdominal Pain    Referring Physician(s): [name]  Supervising Physician: Aletta Edouard  Patient Status: [facility] - In-pt  History of Present Illness: Brandi Bowen is a 79 y.o. female with a medical history significant for breast cancer (in remission; right mastectomy) and uterine fibroids. She presented to the [facility] ED 08/11/20 at the behest of [name] (GYN Oncology) because of abdominal pain and distention as well as lower extremity swelling. She has been followed by [name] for post-menopausal bleeding and she had an MRI 08/04/20 which showed numerous bulky fibroids and large volume ascites. In the ED, CT imaging showed findings concerning for peritoneal carcinomatosis. Lower extremity dopplers showed a left peroneal DVT.  The patient presented to IR 08/12/20 for a paracentesis which yielded 4.8 L. Labs were ordered but returned non-diagnostic.   CT abdomen/pelvis 08/11/20 IMPRESSION: 1. Large amount of ascites, and diffuse mesenteric soft tissue stranding and omental caking, highly suspicious for peritoneal carcinomatosis. 2. Markedly enlarged uterus due to multiple fibroids measuring up to 15 cm. 3. Thickening of the endometrium in the fundal region up to 15 mm. Endometrial carcinoma cannot be excluded in a postmenopausal female. 4. Moderate hiatal hernia. 5. Moderate left and small right pleural effusions, with bilateral lower lobe atelectasis.  Interventional Radiology has been asked to evaluate this patient for an image-guided omental mass biopsy for further work up and diagnosis. This case has been reviewed and procedure approved by Dr. Kathlene Cote.   Past Medical History:  Diagnosis Date  . Breast cancer ([place])   . Cancer ([place])    rt breast ca  . Hydronephrosis   . Hyperlipidemia   . Hypothyroidism   . Uterine leiomyoma   . Vitamin D deficiency 02/25/2012     Past Surgical History:  Procedure Laterality Date  . MASTECTOMY      Allergies: Shrimp [shellfish allergy]  Medications: Prior to Admission medications   Medication Sig Start Date End Date Taking? Authorizing Provider  atenolol (TENORMIN) 50 MG tablet Take 50 mg by mouth daily.   Yes [provider]  Cholecalciferol ([employer] VIT D 5000 HIGH-POTENCY) 5000 UNITS capsule Take 1 capsule (5,000 Units total) by mouth daily. 07/18/14  Yes Magrinat, Virgie Dad, MD  Multiple Vitamin (MULTIVITAMIN) capsule Take 1 capsule by mouth daily. 07/18/14  Yes Magrinat, Virgie Dad, MD  Omega-3 Fatty Acids (FISH OIL) 1000 MG CAPS Take 1 capsule by mouth daily.    Yes [provider]  Probiotic Product (PROBIOTIC DAILY PO) Take 1 capsule by mouth daily.    Yes [provider]  Specialty Vitamins Products (ONE-A-DAY BONE STRENGTH PO) Take 1 tablet by mouth in the morning and at bedtime.    Yes [provider]  vitamin C (ASCORBIC ACID) 500 MG tablet Take 500 mg by mouth 2 (two) times daily.    Yes [provider]  Meclizine HCl (BONINE) 25 MG CHEW Chew 1 tablet (25 mg total) by mouth daily as needed (nausea). 08/16/20   Dwyane Dee, MD  Rivaroxaban (XARELTO) 15 MG TABS tablet Take 1 tablet (15 mg total) by mouth 2 (two) times daily with a meal for 17 days. 08/16/20 09/02/20  Dwyane Dee, MD  rivaroxaban (XARELTO) 20 MG TABS tablet Take 1 tablet (20 mg total) by mouth daily with breakfast. 09/03/20   Dwyane Dee, MD     Family History  Problem Relation Age of Onset  .  Cancer Mother        Colon cancer  . Cancer Brother        Prostate CA    Social History   Socioeconomic History  . Marital status: Widowed    Spouse name: Not on file  . Number of children: Not on file  . Years of education: Not on file  . Highest education level: Not on file  Occupational History  . Not on file  Tobacco Use  . Smoking status: Never Smoker  . Smokeless tobacco: Never  Used  Vaping Use  . Vaping Use: Never used  Substance and Sexual Activity  . Alcohol use: No    Comment: Rare glass of wine  . Drug use: No  . Sexual activity: Yes    Birth control/protection: Post-menopausal  Other Topics Concern  . Not on file  Social History Narrative  . Not on file   Social Determinants of Health   Financial Resource Strain:   . Difficulty of Paying Living Expenses: Not on file  Food Insecurity:   . Worried About Charity fundraiser in the Last Year: Not on file  . Ran Out of Food in the Last Year: Not on file  Transportation Needs:   . Lack of Transportation (Medical): Not on file  . Lack of Transportation (Non-Medical): Not on file  Physical Activity:   . Days of Exercise per Week: Not on file  . Minutes of Exercise per Session: Not on file  Stress:   . Feeling of Stress : Not on file  Social Connections:   . Frequency of Communication with Friends and Family: Not on file  . Frequency of Social Gatherings with Friends and Family: Not on file  . Attends Religious Services: Not on file  . Active Member of Clubs or Organizations: Not on file  . Attends Archivist Meetings: Not on file  . Marital Status: Not on file    Review of Systems: A 12 point ROS discussed and pertinent positives are indicated in the HPI above.  All other systems are negative.  Review of Systems  Constitutional: Positive for appetite change and fatigue.  Respiratory: Negative for cough and shortness of breath.   Cardiovascular: Positive for leg swelling. Negative for chest pain.  Gastrointestinal: Positive for abdominal distention, abdominal pain, nausea and vomiting.  Musculoskeletal: Negative for back pain.  Neurological: Negative for headaches.    Vital Signs: BP 116/89 (BP Location: Left Arm)   Pulse 78   Temp 97.8 F (36.6 C) (Oral)   Resp 17   Ht 5\' 5"  (1.651 m)   Wt 190 lb 0.6 oz (86.2 kg)   SpO2 97%   BMI 31.62 kg/m   Physical  Exam Constitutional:      General: She is not in acute distress. HENT:     Mouth/Throat:     Mouth: Mucous membranes are moist.     Pharynx: Oropharynx is clear.  Cardiovascular:     Rate and Rhythm: Normal rate and regular rhythm.     Pulses: Normal pulses.     Heart sounds: Normal heart sounds.  Pulmonary:     Effort: Pulmonary effort is normal.     Breath sounds: Normal breath sounds.  Abdominal:     General: There is distension.     Tenderness: There is abdominal tenderness.  Musculoskeletal:        General: Normal range of motion.     Right lower leg: Edema present.  Left lower leg: Edema present.  Skin:    General: Skin is warm and dry.  Neurological:     Mental Status: She is alert and oriented to person, place, and time.  Psychiatric:        Mood and Affect: Mood normal.        Behavior: Behavior normal.        Thought Content: Thought content normal.        Judgment: Judgment normal.     Imaging: CT ABDOMEN PELVIS WO CONTRAST  Result Date: 08/14/2020 CLINICAL DATA:  Abdominal distension, history of ascites and recent paracentesis EXAM: CT ABDOMEN AND PELVIS WITHOUT CONTRAST TECHNIQUE: Multidetector CT imaging of the abdomen and pelvis was performed following the standard protocol without IV contrast. COMPARISON:  08/11/2020 FINDINGS: Lower chest: Lung bases demonstrate bilateral pleural effusions left considerably greater than right with lower lobe consolidation also left greater than right. Large hiatal hernia is seen. Hepatobiliary: No focal liver abnormality is seen. No gallstones, gallbladder wall thickening, or biliary dilatation. Pancreas: Scattered calcifications are noted consistent with chronic pancreatitis. Spleen: Normal in size without focal abnormality. Adrenals/Urinary Tract: Adrenal glands are unremarkable. Kidneys demonstrate nonobstructing stone in the lower pole of the right kidney stable from the previous exam. Left renal cyst is seen. No  obstructive changes are noted. The bladder is decompressed. Stomach/Bowel: No obstructive or inflammatory changes of the colon are seen. The appendix is within normal limits. Small bowel and stomach are unremarkable aside from the known hiatal hernia. Vascular/Lymphatic: Aortic atherosclerosis. No enlarged abdominal or pelvic lymph nodes. Reproductive: Uterus is significantly enlarged with multiple calcified uterine fibroids similar to that seen on prior exam. Persistent thickening of the endometrium in the fundus is noted. This is stable from the prior exam. Adnexa are not well visualized. Other: Ascites is identified less than that seen on the prior exam consistent with the recent interval paracentesis. Persistent peritoneal thickening is noted suggestive of carcinomatosis. This is less well appreciated on the current exam given the lack of IV contrast. Musculoskeletal: Degenerative changes of lumbar spine are noted IMPRESSION: Bilateral pleural effusions left greater than right which have increased in the interval from the recent exam. Associated lower lobe consolidation is noted. Ascites, improved from the prior exam consistent with the recent paracentesis. 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 Nonobstructing right renal stone. Multiple calcified uterine fibroids and thickening of the endometrium similar to that noted on the prior exam. Electronically Signed   By: Inez Catalina M.D.   On: 08/14/2020 20:15   DG Chest 2 View  Result Date: 08/11/2020 CLINICAL DATA:  Shortness of breath. Abdominal distention. Ascites. EXAM: CHEST - 2 VIEW COMPARISON:  None. FINDINGS: Moderate left pleural effusion and small right pleural effusion are seen. Atelectasis or consolidation is seen in the left lung base. Upper lung fields are clear. Heart size is within normal limits. IMPRESSION: Moderate left and small right pleural effusions. Left basilar atelectasis versus consolidation.  Electronically Signed   By: Marlaine Hind M.D.   On: 08/11/2020 17:17   DG Abd 1 View  Result Date: 08/16/2020 CLINICAL DATA:  Constipation. EXAM: ABDOMEN - 1 VIEW COMPARISON:  None. FINDINGS: The bowel gas pattern is normal. Mild amount of residual contrast and stool is seen throughout the colon. No radio-opaque calculi or other significant radiographic abnormality are seen. IMPRESSION: No evidence of bowel obstruction or ileus. Electronically Signed   By: Marijo Conception M.D.   On: 08/16/2020 08:17   MR  PELVIS WO CONTRAST  Result Date: 08/05/2020 CLINICAL DATA:  Right lower quadrant pain, uterine leiomyoma EXAM: MRI PELVIS WITHOUT CONTRAST TECHNIQUE: Multiplanar multisequence MR imaging of the pelvis was performed. No intravenous contrast was administered. COMPARISON:  Pelvic ultrasound, 04/27/2020 FINDINGS: Examination is generally somewhat limited by motion artifact throughout. Urinary Tract:  No abnormality visualized. Bowel:  Unremarkable visualized pelvic bowel loops. Vascular/Lymphatic: No pathologically enlarged lymph nodes. No significant vascular abnormality seen. Reproductive: There are numerous bulky uterine fibroids, most notably a very large subserosal or pedunculated fibroid of the posterior body measuring approximately 16.7 x 9.2 x 9.7 cm (series 4, image 17). Overall dimensions of the uterus are at least 17.7 x 14.7 x 13.2 cm. The ovaries are not clearly distinguished due to motion artifact and lack of intravenous contrast. Other: There is large volume ascites throughout the lower abdomen and pelvis. Musculoskeletal: No suspicious bone lesions identified. IMPRESSION: 1. Examination is generally limited by motion artifact throughout as well as lack of intravenous contrast. 2. Numerous bulky uterine fibroids, most notably a very large subserosal or pedunculated fibroid of the posterior body measuring approximately 16.7 x 9.2 x 9.7 cm. Overall dimensions of the uterus are at least 17.7 x 14.7  x 13.2 cm. 3. The ovaries are not clearly distinguished. 4. Large volume ascites throughout the lower abdomen and pelvis. Electronically Signed   By: Eddie Candle M.D.   On: 08/05/2020 15:20   CT Abdomen Pelvis W Contrast  Result Date: 08/11/2020 CLINICAL DATA:  Abdominal distension. Ascites. Fibroids. Personal history of breast carcinoma. EXAM: CT ABDOMEN AND PELVIS WITH CONTRAST TECHNIQUE: Multidetector CT imaging of the abdomen and pelvis was performed using the standard protocol following bolus administration of intravenous contrast. CONTRAST:  161mL OMNIPAQUE IOHEXOL 300 MG/ML  SOLN COMPARISON:  Pelvis MRI on 08/04/2020 FINDINGS: Lower Chest: Moderate left and small right pleural effusions, with left greater than right lower lobe atelectasis. Previous right mastectomy. Hepatobiliary: No hepatic masses identified. Gallbladder is unremarkable. No evidence of biliary ductal dilatation. Pancreas:  No mass or inflammatory changes. Spleen: Within normal limits in size and appearance. Adrenals/Urinary Tract: No masses identified. 5 cm left renal cyst noted. No evidence of ureteral calculi or hydronephrosis. Stomach/Bowel: Moderate hiatal hernia is seen. No evidence of obstruction, inflammatory process or abnormal fluid collections. Vascular/Lymphatic: No pathologically enlarged lymph nodes. No abdominal aortic aneurysm. Aortic atherosclerotic calcification noted. Reproductive: Markedly enlarged uterus is seen due to multiple partially calcified uterine fibroids. The largest fibroid is subserosal in location in the posterior uterine corpus measuring 15.6 cm. Thickening of the endometrium is seen in the fundal region uterus which measures up to 15 mm. Endometrial carcinoma cannot be excluded in a postmenopausal female. No separate adnexal masses are identified. Other: A large amount of ascites is seen. Diffuse mesenteric soft tissue stranding is seen as well as omental caking. Mild peritoneal thickening and  enhancement is also seen in the lower abdomen pelvis. These findings are highly suspicious for peritoneal carcinomatosis. Musculoskeletal:  No suspicious bone lesions identified. IMPRESSION: Large amount of ascites, and diffuse mesenteric soft tissue stranding and omental caking, highly suspicious for peritoneal carcinomatosis. Markedly enlarged uterus due to multiple fibroids measuring up to 15 cm. Thickening of the endometrium in the fundal region up to 15 mm. Endometrial carcinoma cannot be excluded in a postmenopausal female. Moderate hiatal hernia. Moderate left and small right pleural effusions, with bilateral lower lobe atelectasis. Electronically Signed   By: Marlaine Hind M.D.   On: 08/11/2020 17:14   US  Paracentesis  Result Date: 08/12/2020 INDICATION: Patient with a history of breast cancer now with suspected endometrial malignancy presents today with ascites. Interventional radiology asked to perform a therapeutic and diagnostic paracentesis. EXAM: ULTRASOUND GUIDED PARACENTESIS MEDICATIONS: 1% lidocaine 10 mL COMPLICATIONS: None immediate PROCEDURE: Informed written consent was obtained from the patient after a discussion of the risks, benefits and alternatives to treatment. A timeout was performed prior to the initiation of the procedure. Initial ultrasound scanning demonstrates a large amount of ascites within the right lower abdominal quadrant. The right lower abdomen was prepped and draped in the usual sterile fashion. 1% lidocaine was used for local anesthesia. Following this, a 19 gauge, 7-cm, Yueh catheter was introduced. An ultrasound image was saved for documentation purposes. The paracentesis was performed. The catheter was removed and a dressing was applied. The patient tolerated the procedure well without immediate post procedural complication. FINDINGS: A total of approximately 4.8 L of light pink fluid was removed. Samples were sent to the laboratory as requested by the clinical team.  IMPRESSION: Successful ultrasound-guided paracentesis yielding 4.8 liters of peritoneal fluid. Read by: Soyla Dryer, NP Electronically Signed   By: Lucrezia Europe M.D.   On: 08/12/2020 13:50   VAS [place] LOWER EXTREMITY VENOUS (DVT) (ONLY [facility] & [facility] 7a-7p)  Result Date: 08/11/2020  Lower Venous DVTStudy Indications: Swelling.  Comparison Study: no prior Performing Technologist: Abram Sander RVS  Examination Guidelines: A complete evaluation includes B-mode imaging, spectral Doppler, color Doppler, and power Doppler as needed of all accessible portions of each vessel. Bilateral testing is considered an integral part of a complete examination. Limited examinations for reoccurring indications may be performed as noted. The reflux portion of the exam is performed with the patient in reverse Trendelenburg.  +-----+---------------+---------+-----------+----------+--------------+ RIGHTCompressibilityPhasicitySpontaneityPropertiesThrombus Aging +-----+---------------+---------+-----------+----------+--------------+ CFV  Full           Yes      Yes                                 +-----+---------------+---------+-----------+----------+--------------+   +---------+---------------+---------+-----------+----------+--------------+ LEFT     CompressibilityPhasicitySpontaneityPropertiesThrombus Aging +---------+---------------+---------+-----------+----------+--------------+ CFV      Full           Yes      Yes                                 +---------+---------------+---------+-----------+----------+--------------+ SFJ      Full                                                        +---------+---------------+---------+-----------+----------+--------------+ FV Prox  Full                                                        +---------+---------------+---------+-----------+----------+--------------+ FV Mid   Full                                                         +---------+---------------+---------+-----------+----------+--------------+  FV DistalFull                                                        +---------+---------------+---------+-----------+----------+--------------+ PFV      Full                                                        +---------+---------------+---------+-----------+----------+--------------+ POP      Full           Yes      Yes                                 +---------+---------------+---------+-----------+----------+--------------+ PTV      Full                                                        +---------+---------------+---------+-----------+----------+--------------+ PERO     None           No       No                   Acute          +---------+---------------+---------+-----------+----------+--------------+     Summary: RIGHT: - No evidence of common femoral vein obstruction.  LEFT: - Findings consistent with acute deep vein thrombosis involving the left peroneal veins. - No cystic structure found in the popliteal fossa.  *See table(s) above for measurements and observations. Electronically signed by Servando Snare MD on 08/11/2020 at 5:30:50 PM.    Final     Labs:  CBC: Recent Labs    08/13/20 0646 08/14/20 0603 08/15/20 0548 08/16/20 0532  WBC 14.9* 17.3* 16.5* 20.2*  HGB 12.2 12.2 11.6* 12.8  HCT 38.2 38.8 35.8* 40.0  PLT 424* 434* 409* 528*    COAGS: Recent Labs    08/11/20 1835  INR 1.3*  APTT 30    BMP: Recent Labs    08/13/20 0646 08/14/20 0603 08/15/20 0548 08/16/20 0532  NA 135 134* 132* 132*  K 4.3 4.7 4.4 4.6  CL 103 101 98 91*  CO2 22 21* 21* 25  GLUCOSE 134* 145* 119* 160*  BUN 28* 27* 23 30*  CALCIUM 8.4* 8.6* 8.4* 8.9  CREATININE 0.97 0.96 1.04* 1.43*  GFRNONAA 55* 56* 51* 35*    LIVER FUNCTION TESTS: Recent Labs    08/11/20 1422 08/12/20 0721 08/16/20 0532  BILITOT 0.5 0.8 0.8  AST 21 16 18   ALT 14 10 12   ALKPHOS 138* 112 184*  PROT  6.5 5.4* 6.3*  ALBUMIN 2.8* 2.4* 2.5*    TUMOR MARKERS: No results for input(s): AFPTM, CEA, CA199, CHROMGRNA in the last 8760 hours.  Assessment and Plan:  Possible GYN malignancy: Brandi Bowen, 79 year old female, is tentatively scheduled for an image-guided omental mass biopsy at the [facility] department 08/18/20. She presented to the ED with abdominal distention and ascites. CT imaging was positive for diffuse mesenteric  soft tissue stranding, omental caking and a thickening of the endometrium in the fundal region.  Risks and benefits of an omental mass biopsy were discussed with the patient and/or patient's family including, but not limited to bleeding, infection, damage to adjacent structures or low yield requiring additional tests.  All of the questions were answered and there is agreement to proceed.  Orders have been placed for her to be NPO after midnight on 08/18/20 and AM lab work. She has been taking Xarelto, last dose 08/16/20 at 0746. She has been transitioned to a heparin drip and this will need to be discontinued the day of the procedure.   Consent signed and in chart.  Thank you for this interesting consult.  I greatly enjoyed meeting Brandi Bowen and look forward to participating in their care.  A copy of this report was sent to the requesting provider on this date.  Electronically Signed: Soyla Dryer, AGACNP-BC [phone] 08/16/2020, 3:42 PM   I spent a total of 40 Minutes    in face to face in clinical consultation, greater than 50% of which was counseling/coordinating care for omental mass biopsy

## 2020-08-16 NOTE — Progress Notes (Signed)
Canceled appointment with Dr. Alvy Bimler tomorrow per Joylene John, NP due to non diagnostic cytology result.

## 2020-08-16 NOTE — Assessment & Plan Note (Addendum)
-  Paracentesis performed on 08/12/2020 that was nondiagnostic -Patient was developing worsening abdominal distention/fullness since her last paracentesis.  She underwent repeat paracentesis on 08/18/2020, removing 2.1 L fluid. -Follow-up cytology from paracentesis today, 08/18/2020 as last cytology was nondiagnostic - ascites already re-accumulating on repeat CT on 10/17

## 2020-08-16 NOTE — Assessment & Plan Note (Addendum)
-   left peroneal vein - likely provoked in setting of underlying suspected malignancy -Has been on heparin drip during hospitalization in anticipation of biopsy on 08/18/2020. Now has B/L PE, she is NPO due to severe N/V and ileus with NGT in place and cannot go back to Xarelto (treatment may also be discontinued soon if pursuing hospice as well), but for now will start on Lovenox 1 mg/kg BID -If pursuing hospice, can likely discontinue anticoagulation

## 2020-08-16 NOTE — Hospital Course (Addendum)
Ms. Dobbins is an 79 yo CF with PMH uterine mass (u/k etiology currently), hypothyroidism, hyperlipidemia who presented to the hospital with abdominal distention.  She also has a known history of breast cancer in remission. She has recently been followed by GYN oncology for postmenopausal bleeding and underwent imaging studies which have shown large fibroids with ascites. Imaging studies performed in the ER were concerning for possible peritoneal carcinomatosis and possible underlying endometrial malignancy.  She also underwent duplex which revealed left peroneal DVT and was started on anticoagulation, transitioned to Xarelto and back to heparin drip on 08/16/2020. She previously underwent a paracentesis on 08/12/2020 which removed 4.8 L of peritoneal fluid however cytology was nondiagnostic.  She was evaluated by GYN oncology during hospitalization and recommended for tissue biopsy for diagnostic purposes.   She was able to be scheduled for percutaneous biopsy on 08/18/2020 with IR.  She underwent biopsy of omental fat pad performed under CT guidance with 5 core samples obtained and sent to pathology.  She also underwent repeat paracentesis on 08/18/2020 removing 2.1 L of fluid.  This did give her some further relief as her abdomen had become further distended during hospitalization.  On 08/19/16 she had a large amount of bilious vomit, ~500 cc and an NGT was placed. This was accompanied with abscence of flatus and BM also. A CT chest/abd/pelvis was done on 10/17 which showed: Bilateral PE, large left pleural effusion, no pulm nodules, moderate ascites, extensive omental nodularity, peritoneal carcinomatosis, calcified fibroid uterus.   Family discussion was held on the phone extensively on 10/17 with patient's brother, his wife, and the patient's 2 sons. Options are still being considered however in light of everything present and her decreasing quality of life as is without having even started  treatment (if able) plus the dynamics outpatient of patient getting to chemo appts etc (lives alone), family is considering hospice route, pending further discussions and talking with oncology as well.   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 GOC again discussed and patient and family are now pretty certain they will pursue hospice but still will talk formally with oncology regarding their opinion and advice.  Her NGT seems to be providing the most comfort and their preference is for it to remain in; whether that means going to residential hospice or home if able to have a portable suction machine.

## 2020-08-16 NOTE — Progress Notes (Signed)
GYN oncology update  I visited with the patient today and her son, [name].  Overall she is feeling okay but has noted increasing abdominal bloating and had an episode of emesis this morning.  I discussed that unfortunately her paracentesis cytology came back nondiagnostic.  Given her imaging findings and symptoms, I recommend that we pursue tissue biopsy for definitive diagnosis.  We discussed that the 2 ways this can be done are with interventional radiology using percutaneous biopsy and diagnostic laparoscopic surgery.  In the setting of the patient's acute DVT on anticoagulation, likely need for IVC filter with any surgery, and risk of anesthesia, percutaneous biopsy would be preferable.  I reached out to one of the interventional radiologist who is willing to attempt a percutaneous biopsy at the end of the week.  The patient's Xarelto has been held since her dose this morning and she will be transitioned back onto a heparin drip later today.  I discussed both the biopsy and surgery with the patient, her son [name], and her son [name] by phone.  They are all understanding of the risk that a biopsy may not be possible and, that even if 1 is, there may not be sufficient tissue for diagnosis.  If this is the case, then we will work to schedule the patient for diagnostic laparoscopic surgery next week.  Jeral Pinch MD Gynecologic Oncology

## 2020-08-17 ENCOUNTER — Ambulatory Visit: Payer: Medicare HMO | Admitting: Hematology and Oncology

## 2020-08-17 ENCOUNTER — Ambulatory Visit: Payer: Medicare HMO | Admitting: Gynecologic Oncology

## 2020-08-17 DIAGNOSIS — R5381 Other malaise: Secondary | ICD-10-CM | POA: Diagnosis not present

## 2020-08-17 DIAGNOSIS — R180 Malignant ascites: Secondary | ICD-10-CM | POA: Diagnosis not present

## 2020-08-17 DIAGNOSIS — D219 Benign neoplasm of connective and other soft tissue, unspecified: Secondary | ICD-10-CM | POA: Diagnosis not present

## 2020-08-17 DIAGNOSIS — I82452 Acute embolism and thrombosis of left peroneal vein: Secondary | ICD-10-CM | POA: Diagnosis not present

## 2020-08-17 LAB — BASIC METABOLIC PANEL
Anion gap: 13 (ref 5–15)
BUN: 36 mg/dL — ABNORMAL HIGH (ref 8–23)
CO2: 29 mmol/L (ref 22–32)
Calcium: 8.8 mg/dL — ABNORMAL LOW (ref 8.9–10.3)
Chloride: 89 mmol/L — ABNORMAL LOW (ref 98–111)
Creatinine, Ser: 1.51 mg/dL — ABNORMAL HIGH (ref 0.44–1.00)
GFR, Estimated: 32 mL/min — ABNORMAL LOW (ref >60)
Glucose, Bld: 143 mg/dL — ABNORMAL HIGH (ref 70–99)
Potassium: 4.2 mmol/L (ref 3.5–5.1)
Sodium: 131 mmol/L — ABNORMAL LOW (ref 135–145)

## 2020-08-17 LAB — CBC
HCT: 38.2 % (ref 36.0–46.0)
Hemoglobin: 12.3 g/dL (ref 12.0–15.0)
MCH: 26.2 pg (ref 26.0–34.0)
MCHC: 32.2 g/dL (ref 30.0–36.0)
MCV: 81.3 fL (ref 80.0–100.0)
Platelets: 525 10*3/uL — ABNORMAL HIGH (ref 150–400)
RBC: 4.7 MIL/uL (ref 3.87–5.11)
RDW: 13.9 % (ref 11.5–15.5)
WBC: 16.9 10*3/uL — ABNORMAL HIGH (ref 4.0–10.5)
nRBC: 0 % (ref 0.0–0.2)

## 2020-08-17 LAB — MAGNESIUM: Magnesium: 2.7 mg/dL — ABNORMAL HIGH (ref 1.7–2.4)

## 2020-08-17 LAB — APTT
aPTT: 72 seconds — ABNORMAL HIGH (ref 24–36)
aPTT: 133 seconds — ABNORMAL HIGH (ref 24–36)

## 2020-08-17 LAB — HEPARIN LEVEL (UNFRACTIONATED): Heparin Unfractionated: >2.2 IU/mL — ABNORMAL HIGH (ref 0.30–0.70)

## 2020-08-17 MED ORDER — SODIUM CHLORIDE 0.9 % IV SOLN: INTRAVENOUS | Status: AC

## 2020-08-17 MED ORDER — PROMETHAZINE HCL 25 MG/ML IJ SOLN
12.5000 mg | Freq: Four times a day (QID) | INTRAMUSCULAR | Status: DC | PRN
  Administered 2020-08-17 – 2020-08-24 (×10): 12.5 mg via INTRAVENOUS
  Filled 2020-08-17 (×11): qty 1

## 2020-08-17 MED ORDER — HEPARIN (PORCINE) 25000 UT/250ML-% IV SOLN
850.0000 [IU]/h | INTRAVENOUS | Status: DC
  Administered 2020-08-17: 850 [IU]/h via INTRAVENOUS
  Filled 2020-08-17: qty 250

## 2020-08-17 MED ORDER — ENSURE ENLIVE PO LIQD
237.0000 mL | Freq: Three times a day (TID) | ORAL | Status: DC
  Administered 2020-08-17 – 2020-08-18 (×3): 237 mL via ORAL

## 2020-08-17 NOTE — Progress Notes (Signed)
[facility] for Heparin  Indication: DVT  Allergies  Allergen Reactions  . Shrimp [Shellfish Allergy]    Patient Measurements: Height: 5\' 5"  (165.1 cm) Weight: 86.2 kg (190 lb 0.6 oz) IBW/kg (Calculated) : 57 Heparin Dosing Weight: 76 kg  Vital Signs: Temp: 97.5 F (36.4 C) (10/14 1330) Temp Source: Oral (10/14 1330) BP: 133/87 (10/14 1330) Pulse Rate: 78 (10/14 1330)  Labs: Recent Labs    08/15/20 0548 08/15/20 0548 08/16/20 0532 08/17/20 0559 08/17/20 1405  HGB 11.6*   < > 12.8 12.3  --   HCT 35.8*  --  40.0 38.2  --   PLT 409*  --  528* 525*  --   APTT  --   --   --  72* 133*  HEPARINUNFRC  --   --   --  >2.20*  --   CREATININE 1.04*  --  1.43* 1.51*  --    < > = values in this interval not displayed.    Estimated Creatinine Clearance: 32.2 mL/min (A) (by C-G formula based on SCr of 1.51 mg/dL (H)).  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 Patient was initiated on heparin infusion then transitioned to Xarelto.  Pharmacy consulted to transition back to heparin infusion due to planned biopsy. Last dose of Xarelto 15 mg 10/13 @ 0746.  08/17/20 4:32 PM   APTT 133, above goal range.  (result delayed d/t not "released" by lab until ~16:30)  No bleeding or complications noted  Goal of Therapy:  Heparin level 0.3-0.7 units/ml aPTT 66-102 seconds Monitor platelets by anticoagulation protocol: Yes   Plan:  Decrease to heparin IV infusion at 850 units/hr APTT 8 hours after rate change Daily aPTT, heparin level and CBC Monitor using aPTT until correlation with HL  Gretta Arab PharmD, BCPS Clinical Pharmacist [facility] main pharmacy [phone] 08/17/2020 4:32 PM

## 2020-08-17 NOTE — Progress Notes (Signed)
PROGRESS NOTE    Brandi Bowen   FGH:[id number]  DOB: 04-19-1941  DOA: 08/11/2020     5  PCP: [name], PA-C  CC: abdominal distension  Hospital Course: Brandi Bowen is an 79 yo CF with PMH uterine mass (u/k etiology currently), hypothyroidism, hyperlipidemia who presented to the hospital with abdominal distention.  She also has a known history of breast cancer in remission. She has recently been followed by GYN oncology for postmenopausal bleeding and underwent imaging studies which have shown large fibroids with ascites. Imaging studies performed in the ER were concerning for possible peritoneal carcinomatosis and possible underlying endometrial malignancy.  She also underwent duplex which revealed left peroneal DVT and was started on anticoagulation, transitioned to Xarelto and back to heparin drip on 08/16/2020. She previously underwent a paracentesis on 08/12/2020 which removed 4.8 L of peritoneal fluid however cytology was nondiagnostic.  She was evaluated by GYN oncology during hospitalization and recommended for tissue biopsy for diagnostic purposes.  Tentatively plan is for CT-guided percutaneous biopsy on 08/18/2020.  If unsuccessful or nondiagnostic, patient may still need a surgical biopsy (in which case may need discontinuation of anticoagulation and placement of IVC filter).   Interval History:  More nauseous today; phenergan was added. Not eating much due to "fullness" sensation as her abdomen is more distended now.   Old records reviewed in assessment of this patient  ROS: Constitutional: negative for chills and fevers, Respiratory: negative for cough, Cardiovascular: negative for chest pain and Gastrointestinal: positive for Abdominal distention  Assessment & Plan: * Fibroids -Large fibroid noted on CT abdomen/pelvis.  There is high suspicion for underlying malignancy -Tentative plan is for tissue biopsy on 08/18/2020 with CT-guided biopsy in IR -Being followed  by GYN oncology during hospitalization, appreciate assistance  Acute deep vein thrombosis (DVT) of left lower extremity ([place]) - left peroneal vein - likely provoked in setting of underlying suspected malignancy - transitioned to xarelto but now back to heparin gtt on 10/13 in anticipation of CT biopsy on 10/15 - d/c heparin drip morning of 10/15  Ascites -Paracentesis performed on 08/12/2020 that was nondiagnostic - fluid may already be re-accumulating given worsening distension, fullness, poor appetite once again - will discuss with IR to see if possible to re-evaluate ascites on 10/15 during biopsy and if possible to repeat paracentesis for therapeutic purposes  Physical deconditioning - patient lives alone and does not seem to have 24/7 support. Children live out of town; she does have friends closeby for help per son - follow up PT eval as patient seems to be feeling worse over the past couple days now. She and her family will discuss possibility of rehab    Antimicrobials: None  DVT prophylaxis: Heparin drip Code Status: Full Family Communication: none present this am Disposition Plan: Status is: Inpatient  Remains inpatient appropriate because:Ongoing diagnostic testing needed not appropriate for outpatient work up, Unsafe d/c plan, IV treatments appropriate due to intensity of illness or inability to take PO and Inpatient level of care appropriate due to severity of illness   Dispo: The patient is from: Home              Anticipated d/c is to: pending PT eval              Anticipated d/c date is: 2 days              Patient currently is not medically stable to d/c.  Objective: Blood pressure 133/87, pulse 78,  temperature (!) 97.5 F (36.4 C), temperature source Oral, resp. rate 16, height 5\' 5"  (1.651 m), weight 86.2 kg, SpO2 96 %.  Examination: General appearance: alert, cooperative and no distress Head: Normocephalic, without obvious abnormality, atraumatic Eyes:  EOMI Lungs: clear to auscultation bilaterally Heart: regular rate and rhythm and S1, S2 normal Abdomen: more distended/firm; nonspecific TTP; hypoactive and distant BS Extremities: 1-2+ LE edema Skin: mobility and turgor normal Neurologic: Grossly normal  Consultants:   GYN onc  IR  Procedures:   n/a  Data Reviewed: I have personally reviewed following labs and imaging studies Results for orders placed or performed during the hospital encounter of 08/11/20 (from the past 24 hour(s))  APTT     Status: Abnormal   Collection Time: 08/17/20  5:59 AM  Result Value Ref Range   aPTT 72 (H) 24 - 36 seconds  Heparin level (unfractionated)     Status: Abnormal   Collection Time: 08/17/20  5:59 AM  Result Value Ref Range   Heparin Unfractionated >2.20 (H) 0.30 - 0.70 IU/mL  CBC     Status: Abnormal   Collection Time: 08/17/20  5:59 AM  Result Value Ref Range   WBC 16.9 (H) 4.0 - 10.5 K/uL   RBC 4.70 3.87 - 5.11 MIL/uL   Hemoglobin 12.3 12.0 - 15.0 g/dL   HCT 38.2 36 - 46 %   MCV 81.3 80.0 - 100.0 fL   MCH 26.2 26.0 - 34.0 pg   MCHC 32.2 30.0 - 36.0 g/dL   RDW 13.9 11.5 - 15.5 %   Platelets 525 (H) 150 - 400 K/uL   nRBC 0.0 0.0 - 0.2 %  Basic metabolic panel     Status: Abnormal   Collection Time: 08/17/20  5:59 AM  Result Value Ref Range   Sodium 131 (L) 135 - 145 mmol/L   Potassium 4.2 3.5 - 5.1 mmol/L   Chloride 89 (L) 98 - 111 mmol/L   CO2 29 22 - 32 mmol/L   Glucose, Bld 143 (H) 70 - 99 mg/dL   BUN 36 (H) 8 - 23 mg/dL   Creatinine, Ser 1.51 (H) 0.44 - 1.00 mg/dL   Calcium 8.8 (L) 8.9 - 10.3 mg/dL   GFR, Estimated 32 (L) >60 mL/min   Anion gap 13 5 - 15  Magnesium     Status: Abnormal   Collection Time: 08/17/20  5:59 AM  Result Value Ref Range   Magnesium 2.7 (H) 1.7 - 2.4 mg/dL    Recent Results (from the past 240 hour(s))  Respiratory Panel by RT PCR (Flu A&B, Covid) - Nasopharyngeal Swab     Status: None   Collection Time: 08/11/20  6:40 PM   Specimen:  Nasopharyngeal Swab  Result Value Ref Range Status   SARS Coronavirus 2 by RT PCR NEGATIVE NEGATIVE Final    Comment: (NOTE) SARS-CoV-2 target nucleic acids are NOT DETECTED.  The SARS-CoV-2 RNA is generally detectable in upper respiratoy specimens during the acute phase of infection. The lowest concentration of SARS-CoV-2 viral copies this assay can detect is 131 copies/mL. A negative result does not preclude SARS-Cov-2 infection and should not be used as the sole basis for treatment or other patient management decisions. A negative result may occur with  improper specimen collection/handling, submission of specimen other than nasopharyngeal swab, presence of viral mutation(s) within the areas targeted by this assay, and inadequate number of viral copies (<131 copies/mL). A negative result must be combined with clinical observations, patient history, and epidemiological  information. The expected result is Negative.  Fact Sheet for Patients:  PinkCheek.be  Fact Sheet for Healthcare Providers:  GravelBags.it  This test is no t yet approved or cleared by the [place] FDA and  has been authorized for detection and/or diagnosis of SARS-CoV-2 by FDA under an Emergency Use Authorization (EUA). This EUA will remain  in effect (meaning this test can be used) for the duration of the COVID-19 declaration under Section 564(b)(1) of the Act, 21 U.S.C. section 360bbb-3(b)(1), unless the authorization is terminated or revoked sooner.     Influenza A by PCR NEGATIVE NEGATIVE Final   Influenza B by PCR NEGATIVE NEGATIVE Final    Comment: (NOTE) The Xpert Xpress SARS-CoV-2/FLU/RSV assay is intended as an aid in  the diagnosis of influenza from Nasopharyngeal swab specimens and  should not be used as a sole basis for treatment. Nasal washings and  aspirates are unacceptable for Xpert Xpress SARS-CoV-2/FLU/RSV  testing.  Fact Sheet  for Patients: PinkCheek.be  Fact Sheet for Healthcare Providers: GravelBags.it  This test is not yet approved or cleared by the [place] FDA and  has been authorized for detection and/or diagnosis of SARS-CoV-2 by  FDA under an Emergency Use Authorization (EUA). This EUA will remain  in effect (meaning this test can be used) for the duration of the  Covid-19 declaration under Section 564(b)(1) of the Act, 21  U.S.C. section 360bbb-3(b)(1), unless the authorization is  terminated or revoked. Performed at [facility], [place] [address] [place], [place] [address]   Gram stain     Status: None   Collection Time: 08/12/20  2:02 PM   Specimen: Peritoneal Washings  Result Value Ref Range Status   Specimen Description   Final    PERITONEAL Performed at [facility] [address], [place], [place] [address]    Special Requests   Final    NONE Performed at [facility], [place] [address] [place], [place] [address]    Gram Stain   Final    RARE WBC PRESENT, PREDOMINANTLY MONONUCLEAR NO ORGANISMS SEEN Performed at [facility], [place] [address] [place],  [address]    Report Status 08/14/2020 FINAL  Final     Radiology Studies: DG Abd 1 View  Result Date: 08/16/2020 CLINICAL DATA:  Constipation. EXAM: ABDOMEN - 1 VIEW COMPARISON:  None. FINDINGS: The bowel gas pattern is normal. Mild amount of residual contrast and stool is seen throughout the colon. No radio-opaque calculi or other significant radiographic abnormality are seen. IMPRESSION: No evidence of bowel obstruction or ileus. Electronically Signed   By: Marijo Conception M.D.   On: 08/16/2020 08:17   DG Abd 1 View  Final Result    CT ABDOMEN PELVIS WO CONTRAST  Final Result    US Paracentesis  Final Result    VAS [place] LOWER EXTREMITY VENOUS (DVT) (ONLY [facility] & [facility] 7a-7p)  Final Result    DG Chest 2  View  Final Result    CT Abdomen Pelvis W Contrast  Final Result    CT BIOPSY    (Results Pending)    Scheduled Meds: . atenolol  50 mg Oral QHS  . docusate sodium  100 mg Oral BID  . feeding supplement  237 mL Oral TID with meals  . lactulose  300 mL Rectal Once  . polyethylene glycol  17 g Oral Daily   PRN Meds: bisacodyl, ondansetron (ZOFRAN) IV, polyethylene glycol, promethazine, traMADol Continuous  Infusions: . sodium chloride 75 mL/hr at 08/17/20 1217  . heparin 1,150 Units/hr (08/16/20 2028)      LOS: 5 days  Time spent: Greater than 50% of the 35 minute visit was spent in counseling/coordination of care for the patient as laid out in the A&P.   Dwyane Dee, MD [facility] 08/17/2020, 2:15 PM

## 2020-08-17 NOTE — Plan of Care (Signed)
Pt reporting nausea this am.  PRN meds administered per MAR.  No further complaints at this time.     Problem: Consults Goal: Venous Thromboembolism Patient Education Description: See Patient Education Module for education specifics. 08/17/2020 1605 by Hewitt Blade, Eduard Penkala D, RN Outcome: Progressing 08/17/2020 1605 by Hewitt Blade, Noach Calvillo D, RN Outcome: Not Progressing   Problem: Education: Goal: Knowledge of General Education information will improve Description: Including pain rating scale, medication(s)/side effects and non-pharmacologic comfort measures 08/17/2020 1605 by Hewitt Blade, Teylor Wolven D, RN Outcome: Progressing 08/17/2020 1605 by Hewitt Blade, Caellum Mancil D, RN Outcome: Not Progressing   Problem: Health Behavior/Discharge Planning: Goal: Ability to manage health-related needs will improve 08/17/2020 1605 by Hewitt Blade, Alexiz Cothran D, RN Outcome: Progressing 08/17/2020 1605 by Hewitt Blade, Samil Mecham D, RN Outcome: Not Progressing   Problem: Clinical Measurements: Goal: Ability to maintain clinical measurements within normal limits will improve 08/17/2020 1605 by Hewitt Blade, Coyle Stordahl D, RN Outcome: Progressing 08/17/2020 1605 by Hewitt Blade, Aaran Enberg D, RN Outcome: Not Progressing Goal: Will remain free from infection 08/17/2020 1605 by Hewitt Blade, Daejah Klebba D, RN Outcome: Progressing 08/17/2020 1605 by Hewitt Blade, Leesa Leifheit D, RN Outcome: Not Progressing Goal: Diagnostic test results will improve 08/17/2020 1605 by Hewitt Blade, Norabelle Kondo D, RN Outcome: Progressing 08/17/2020 1605 by Hewitt Blade, Breasia Karges D, RN Outcome: Not Progressing Goal: Respiratory complications will improve 08/17/2020 1605 by Hewitt Blade, Lavelle Berland D, RN Outcome: Progressing 08/17/2020 1605 by Hewitt Blade, Keyundra Fant D, RN Outcome: Not Progressing Goal: Cardiovascular complication will be avoided 08/17/2020 1605 by Hewitt Blade, Lajuan Godbee D,  RN Outcome: Progressing 08/17/2020 1605 by Hewitt Blade, Nazly Digilio D, RN Outcome: Not Progressing   Problem: Activity: Goal: Risk for activity intolerance will decrease 08/17/2020 1605 by Hewitt Blade, Jessey Huyett D, RN Outcome: Progressing 08/17/2020 1605 by Hewitt Blade, Cheyan Frees D, RN Outcome: Not Progressing   Problem: Coping: Goal: Level of anxiety will decrease 08/17/2020 1605 by Hewitt Blade, Jondavid Schreier D, RN Outcome: Progressing 08/17/2020 1605 by Hewitt Blade, Adellyn Capek D, RN Outcome: Not Progressing   Problem: Elimination: Goal: Will not experience complications related to bowel motility 08/17/2020 1605 by Hewitt Blade, Montoya Brandel D, RN Outcome: Progressing 08/17/2020 1605 by Hewitt Blade, Preciliano Castell D, RN Outcome: Not Progressing Goal: Will not experience complications related to urinary retention 08/17/2020 1605 by Hewitt Blade, Alicja Everitt D, RN Outcome: Progressing 08/17/2020 1605 by Hewitt Blade, Myda Detwiler D, RN Outcome: Not Progressing   Problem: Pain Managment: Goal: General experience of comfort will improve 08/17/2020 1605 by Hewitt Blade, Tevis Conger D, RN Outcome: Progressing 08/17/2020 1605 by Hewitt Blade, Ambur Province D, RN Outcome: Not Progressing   Problem: Safety: Goal: Ability to remain free from injury will improve 08/17/2020 1605 by Hewitt Blade, Jalecia Leon D, RN Outcome: Progressing 08/17/2020 1605 by Hewitt Blade, Alioune Hodgkin D, RN Outcome: Not Progressing   Problem: Skin Integrity: Goal: Risk for impaired skin integrity will decrease 08/17/2020 1605 by Hewitt Blade, Sallyann Kinnaird D, RN Outcome: Progressing 08/17/2020 1605 by Hewitt Blade, Ibtisam Benge D, RN Outcome: Not Progressing   Problem: Nutrition: Goal: Adequate nutrition will be maintained 08/17/2020 1605 by Hewitt Blade, Corderro Koloski D, RN Outcome: Not Progressing 08/17/2020 1605 by Hewitt Blade, Kimiyo Carmicheal D, RN Outcome: Not Progressing

## 2020-08-17 NOTE — Assessment & Plan Note (Addendum)
-   patient lives alone and does not seem to have 24/7 support. Children live out of town; she does have friends closeby for help per son - patient has declined further in past 35 hours; no longer feel she is safe for home alone  -After multiple discussions over the past several days, family now wishing for residential hospice.  If she does go home she would have continuous involvement with family however they would still be concerned about providing adequate care and the preference is for placement somewhere with 24/7 nursing and staff support

## 2020-08-17 NOTE — Progress Notes (Signed)
Brandi Bowen for Heparin  Indication: DVT  Allergies  Allergen Reactions  . Shrimp [Shellfish Allergy]    Patient Measurements: Height: 5\' 5"  (165.1 cm) Weight: 86.2 kg (190 lb 0.6 oz) IBW/kg (Calculated) : 57 Heparin Dosing Weight: 76 kg  Vital Signs: Temp: 98.2 F (36.8 C) (10/14 0547) Temp Source: Oral (10/14 0547) BP: 127/80 (10/14 0547) Pulse Rate: 80 (10/14 0547)  Labs: Recent Labs    08/15/20 0548 08/15/20 0548 08/16/20 0532 08/17/20 0559  HGB 11.6*   < > 12.8 12.3  HCT 35.8*  --  40.0 38.2  PLT 409*  --  528* 525*  APTT  --   --   --  72*  HEPARINUNFRC  --   --   --  >2.20*  CREATININE 1.04*  --  1.43*  --    < > = values in this interval not displayed.    Estimated Creatinine Clearance: 34 mL/min (A) (by C-G formula based on SCr of 1.43 mg/dL (H)).  Medical History: Past Medical History:  Diagnosis Date  . Breast cancer ([place])   . Cancer ([place])    rt breast ca  . Hydronephrosis   . Hyperlipidemia   . Hypothyroidism   . Uterine leiomyoma   . Vitamin D deficiency 02/25/2012   Medications:  Initially transitioned to Xarelto 15 mg bid >> 20 mg daily, last dose of 15 mg 10/13 @ 0746, back to Heparin for biopsy  Assessment: [name] admitted with new-onset ascites, likely malignant per MD, with acute DVT. Patient was initiated on heparin infusion then transitioned to Xarelto. Today SCr has significantly bumped. Patient has been receiving Lasix. Pharmacy consulted to transition back to heparin infusion due to planned biopsy.  08/17/20 8:20 AM   APTT 72 sec, within goal range, Heparin level > 2.2 as expected with prev Xarelto  CBC stable, wnl, Plt elevated  SCr significantly increased 1.04 > 1.43 (10/13  Goal of Therapy:  Heparin level 0.3-0.7 units/ml aPTT 66-102 seconds Monitor platelets by anticoagulation protocol: Yes   Plan:  Continue Heparin at 1150 units/hr Check confirmatory aPTT at 2pm - 8hr after am  level Monitor using aPTT until correlation with HL Daily CBC and HL  Minda Ditto PharmD 08/17/2020,8:20 AM

## 2020-08-17 NOTE — Evaluation (Signed)
Physical Therapy Evaluation Patient Details Name: Brandi Bowen MRN: [id number] DOB: 03-Feb-1941 Today's Date: 08/17/2020   History of Present Illness  Brandi Bowen is an 79 yo CF with PMH uterine mass (u/k etiology currently), hypothyroidism, hyperlipidemia who presented to the hospital 08/11/20 with abdominal distention. PMH: breast cancer in remission.Recently W/U for  postmenopausal bleeding with imaging studies showing large fibroids with ascites.Positive for left peroneal DVT S/P paracentesis on 08/12/2020 which removed 4.8 L of peritoneal fluid  Clinical Impression  The patient is motivated to ambulate. Moves slowly, RW provides increased stability. Patient lives alone. Has 1 son in from out of town and another to come in Sunday when this son leaves. Patient  Does live alone and is independnent and drives. Pt admitted with above diagnosis.   Pt currently with functional limitations due to the deficits listed below (see PT Problem List). Pt will benefit from skilled PT to increase their independence and safety with mobility to allow discharge to the venue listed below.       Follow Up Recommendations Home health PT;Supervision/Assistance - 24 hour    Equipment Recommendations  None recommended by PT    Recommendations for Other Services OT consult     Precautions / Restrictions Precautions Precautions: Fall      Mobility  Bed Mobility               General bed mobility comments: in rcliner  Transfers Overall transfer level: Needs assistance Equipment used: Rolling walker (2 wheeled);1 person hand held assist Transfers: Sit to/from Stand Sit to Stand: Supervision         General transfer comment: from recliner and toilet  Ambulation/Gait Ambulation/Gait assistance: Min guard;Min assist Gait Distance (Feet): 20 Feet (then 300') Assistive device: Rolling walker (2 wheeled);1 person hand held assist Gait Pattern/deviations: Step-through pattern Gait velocity:  decr   General Gait Details: first amb wiyth HHA to BR, then used RW for second walk.  Stairs            Wheelchair Mobility    Modified Rankin (Stroke Patients Only)       Balance Overall balance assessment: No apparent balance deficits (not formally assessed)                                           Pertinent Vitals/Pain      Home Living Family/patient expects to be discharged to:: Private residence Living Arrangements: Alone Available Help at Discharge: Family;Available PRN/intermittently Type of Home: House Home Access: Stairs to enter   Entrance Stairs-Number of Steps: 1 Home Layout: One level Home Equipment: Walker - 2 wheels;Bedside commode Additional Comments: son in  from [place], leaves subday, another son coming in for short stay, essentially patient home alone    Prior Function Level of Independence: Independent         Comments: drives     Hand Dominance   Dominant Hand: Right    Extremity/Trunk Assessment   Upper Extremity Assessment Upper Extremity Assessment: Generalized weakness         Cervical / Trunk Assessment Cervical / Trunk Assessment: Normal  Communication   Communication: No difficulties  Cognition Arousal/Alertness: Awake/alert Behavior During Therapy: WFL for tasks assessed/performed Overall Cognitive Status: Within Functional Limits for tasks assessed  General Comments      Exercises General Exercises - Lower Extremity Ankle Circles/Pumps: AROM;Both;10 reps Long Arc Quad: AROM;Seated;10 reps;Both Hip Flexion/Marching: AROM;Seated;Both;10 reps   Assessment/Plan    PT Assessment Patient needs continued PT services  PT Problem List Decreased strength;Decreased activity tolerance;Decreased mobility       PT Treatment Interventions DME instruction;Gait training;Functional mobility training;Therapeutic activities;Therapeutic  exercise;Patient/family education    PT Goals (Current goals can be found in the Care Plan section)  Acute Rehab PT Goals Patient Stated Goal: feel better and go home PT Goal Formulation: With patient/family Time For Goal Achievement: 08/31/20 Potential to Achieve Goals: Good    Frequency Min 3X/week   Barriers to discharge Decreased caregiver support sons live out of town, here briefly    Co-evaluation               AM-PAC PT "6 Clicks" Mobility  Outcome Measure Help needed turning from your back to your side while in a flat bed without using bedrails?: A Little Help needed moving from lying on your back to sitting on the side of a flat bed without using bedrails?: A Little Help needed moving to and from a bed to a chair (including a wheelchair)?: A Little Help needed standing up from a chair using your arms (e.g., wheelchair or bedside chair)?: A Little Help needed to walk in hospital room?: A Little Help needed climbing 3-5 steps with a railing? : A Little 6 Click Score: 18    End of Session Equipment Utilized During Treatment: Gait belt Activity Tolerance: Patient tolerated treatment well Patient left: in chair;with call bell/phone within reach;with family/visitor present Nurse Communication: Mobility status PT Visit Diagnosis: Unsteadiness on feet (R26.81);Difficulty in walking, not elsewhere classified (R26.2)    Time: 0211-1552 PT Time Calculation (min) (ACUTE ONLY): 23 min   Charges:   PT Evaluation $PT Eval Low Complexity: 1 Low PT Treatments $Gait Training: 8-22 mins        Tresa Endo PT Acute Rehabilitation Services Pager [phone] Office [phone]   Claretha Cooper 08/17/2020, 4:11 PM

## 2020-08-18 ENCOUNTER — Inpatient Hospital Stay (HOSPITAL_COMMUNITY): Payer: Medicare HMO

## 2020-08-18 DIAGNOSIS — R180 Malignant ascites: Secondary | ICD-10-CM | POA: Diagnosis not present

## 2020-08-18 DIAGNOSIS — D219 Benign neoplasm of connective and other soft tissue, unspecified: Secondary | ICD-10-CM | POA: Diagnosis not present

## 2020-08-18 DIAGNOSIS — I82452 Acute embolism and thrombosis of left peroneal vein: Secondary | ICD-10-CM | POA: Diagnosis not present

## 2020-08-18 LAB — CBC
HCT: 33.1 % — ABNORMAL LOW (ref 36.0–46.0)
Hemoglobin: 10.8 g/dL — ABNORMAL LOW (ref 12.0–15.0)
MCH: 26.7 pg (ref 26.0–34.0)
MCHC: 32.6 g/dL (ref 30.0–36.0)
MCV: 81.9 fL (ref 80.0–100.0)
Platelets: 481 10*3/uL — ABNORMAL HIGH (ref 150–400)
RBC: 4.04 MIL/uL (ref 3.87–5.11)
RDW: 14 % (ref 11.5–15.5)
WBC: 12.2 10*3/uL — ABNORMAL HIGH (ref 4.0–10.5)
nRBC: 0 % (ref 0.0–0.2)

## 2020-08-18 LAB — BASIC METABOLIC PANEL
Anion gap: 11 (ref 5–15)
BUN: 38 mg/dL — ABNORMAL HIGH (ref 8–23)
CO2: 25 mmol/L (ref 22–32)
Calcium: 7.2 mg/dL — ABNORMAL LOW (ref 8.9–10.3)
Chloride: 99 mmol/L (ref 98–111)
Creatinine, Ser: 1.25 mg/dL — ABNORMAL HIGH (ref 0.44–1.00)
GFR, Estimated: 41 mL/min — ABNORMAL LOW (ref >60)
Glucose, Bld: 121 mg/dL — ABNORMAL HIGH (ref 70–99)
Potassium: 3.6 mmol/L (ref 3.5–5.1)
Sodium: 135 mmol/L (ref 135–145)

## 2020-08-18 LAB — APTT: aPTT: 54 seconds — ABNORMAL HIGH (ref 24–36)

## 2020-08-18 LAB — PROTIME-INR
INR: 1.5 — ABNORMAL HIGH (ref 0.8–1.2)
Prothrombin Time: 17.4 seconds — ABNORMAL HIGH (ref 11.4–15.2)

## 2020-08-18 LAB — HEPARIN LEVEL (UNFRACTIONATED): Heparin Unfractionated: 0.74 IU/mL — ABNORMAL HIGH (ref 0.30–0.70)

## 2020-08-18 LAB — MAGNESIUM: Magnesium: 2.3 mg/dL (ref 1.7–2.4)

## 2020-08-18 MED ORDER — HEPARIN (PORCINE) 25000 UT/250ML-% IV SOLN
1000.0000 [IU]/h | INTRAVENOUS | Status: DC
  Filled 2020-08-18: qty 250

## 2020-08-18 MED ORDER — MIDAZOLAM HCL 2 MG/2ML IJ SOLN
INTRAMUSCULAR | Status: AC
  Filled 2020-08-18: qty 4

## 2020-08-18 MED ORDER — ONDANSETRON HCL 4 MG/2ML IJ SOLN
INTRAMUSCULAR | Status: AC | PRN
  Administered 2020-08-18: 4 mg via INTRAVENOUS

## 2020-08-18 MED ORDER — LIDOCAINE HCL (PF) 1 % IJ SOLN
INTRAMUSCULAR | Status: AC | PRN
  Administered 2020-08-18: 10 mL

## 2020-08-18 MED ORDER — MIDAZOLAM HCL 2 MG/2ML IJ SOLN
INTRAMUSCULAR | Status: AC | PRN
  Administered 2020-08-18: 1 mg via INTRAVENOUS

## 2020-08-18 MED ORDER — FENTANYL CITRATE (PF) 100 MCG/2ML IJ SOLN
INTRAMUSCULAR | Status: AC
  Filled 2020-08-18: qty 2

## 2020-08-18 MED ORDER — ONDANSETRON HCL 4 MG/2ML IJ SOLN
INTRAMUSCULAR | Status: AC
  Filled 2020-08-18: qty 2

## 2020-08-18 MED ORDER — LIDOCAINE HCL 1 % IJ SOLN
INTRAMUSCULAR | Status: AC
  Filled 2020-08-18: qty 20

## 2020-08-18 MED ORDER — HEPARIN (PORCINE) 25000 UT/250ML-% IV SOLN
1000.0000 [IU]/h | INTRAVENOUS | Status: DC
  Administered 2020-08-18: 1000 [IU]/h via INTRAVENOUS
  Filled 2020-08-18: qty 250

## 2020-08-18 NOTE — Progress Notes (Addendum)
Brandi Bowen for Heparin  Indication: DVT  Allergies  Allergen Reactions  . Shrimp [Shellfish Allergy]    Patient Measurements: Height: 5\' 5"  (165.1 cm) Weight: 86.2 kg (190 lb 0.6 oz) IBW/kg (Calculated) : 57 Heparin Dosing Weight: 76 kg  Vital Signs: Temp: 97.6 F (36.4 C) (10/14 2111) Temp Source: Oral (10/14 2111) BP: 110/83 (10/14 2111) Pulse Rate: 80 (10/14 2111)  Labs: Recent Labs    08/16/20 0532 08/16/20 0532 08/17/20 0559 08/17/20 1405 08/18/20 0100  HGB 12.8   < > 12.3  --  10.8*  HCT 40.0  --  38.2  --  33.1*  PLT 528*  --  525*  --  481*  APTT  --   --  72* 133* 54*  HEPARINUNFRC  --   --  >2.20*  --  0.74*  CREATININE 1.43*  --  1.51*  --  1.25*   < > = values in this interval not displayed.    Estimated Creatinine Clearance: 38.9 mL/min (A) (by C-G formula based on SCr of 1.25 mg/dL (H)).  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 Patient was initiated on heparin infusion then transitioned to Xarelto.  Pharmacy consulted to transition back to heparin infusion due to planned biopsy. Last dose of Xarelto 15 mg 10/13 @ 0746.  08/18/20 1:55 AM   APTT 54 (subtherapeutic) with heparin @ 850 units/hr  HL = 0.74 (trending however however still falsely elevated)  Hgb low = 10.8  No bleeding or complications noted  Goal of Therapy:  Heparin level 0.3-0.7 units/ml aPTT 66-102 seconds Monitor platelets by anticoagulation protocol: Yes   Plan:  Increase heparin IV infusion to 1000 units/hr Daily aPTT, heparin level and CBC Monitor using aPTT until correlation with HL  Heparin turned off @ 0400 on 10/15 for anticipated IR procedure F/U restart of anticoagulation  [name], PharmD 08/18/2020 1:55 AM

## 2020-08-18 NOTE — Progress Notes (Signed)
MEDICATION-RELATED CONSULT NOTE   IR Procedure Consult - Anticoagulant/Antiplatelet PTA/Inpatient Med List Review by Pharmacist    Procedure: Paracentesis and biopsies    Completed: 09:19  Post-Procedural bleeding risk per IR MD assessment: Standard  Antithrombotic medications on inpatient or PTA profile prior to procedure:  Heparin infusion    Recommended restart time per IR Post-Procedure Guidelines:  6 hr post-procedure for unfractionated Heparin, will transition back to Xarelto on 10/16 with pm dose per IR recommendation  Other considerations:      Plan:    Resume Heparin at 1000 units/hr at 1600  Minda Ditto PharmD 08/18/2020, 12:18 PM

## 2020-08-18 NOTE — Procedures (Signed)
Interventional Radiology Procedure Note  Procedure: US guided paracentesis; CT Guided Biopsy of omental fat  Complications: None  Estimated Blood Loss: < 10 mL  Findings: Paracentesis from left mid abdominal approach yielded 2.1 L of fluid. Full liter bottle sent for cytology.  65 G core biopsy of omental fat performed under CT guidance.  Five core samples obtained and sent to Pathology.  Venetia Night. Kathlene Cote, M.D Pager:  [phone]

## 2020-08-18 NOTE — Progress Notes (Signed)
[place] for Heparin  Indication: DVT  Allergies  Allergen Reactions  . Shrimp [Shellfish Allergy]    Patient Measurements: Height: 5\' 5"  (165.1 cm) Weight: 86.2 kg (190 lb 0.6 oz) IBW/kg (Calculated) : 57 Heparin Dosing Weight: 76 kg  Vital Signs: Temp: 97.7 F (36.5 C) (10/15 0936) Temp Source: Oral (10/15 0936) BP: 103/77 (10/15 0936) Pulse Rate: 73 (10/15 0936)  Labs: Recent Labs    08/16/20 0532 08/16/20 0532 08/17/20 0559 08/17/20 1405 08/18/20 0100  HGB 12.8   < > 12.3  --  10.8*  HCT 40.0  --  38.2  --  33.1*  PLT 528*  --  525*  --  481*  APTT  --   --  72* 133* 54*  LABPROT  --   --   --   --  17.4*  INR  --   --   --   --  1.5*  HEPARINUNFRC  --   --  >2.20*  --  0.74*  CREATININE 1.43*  --  1.51*  --  1.25*   < > = values in this interval not displayed.    Estimated Creatinine Clearance: 38.9 mL/min (A) (by C-G formula based on SCr of 1.25 mg/dL (H)).  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 Patient was initiated on heparin infusion then transitioned to Xarelto.  Pharmacy consulted to transition back to heparin infusion due to planned biopsy. Last dose of Xarelto 15 mg 10/13 @ 0746.  08/18/20 10:46 AM   APTT 133, above goal range.  (result delayed d/t not "released" by lab until ~16:30)  No bleeding or complications noted  Goal of Therapy:  Heparin level 0.3-0.7 units/ml aPTT 66-102 seconds Monitor platelets by anticoagulation protocol: Yes   Plan:  At 1600 today resume Heparin IV infusion at 1000 units/hr APTT & HL 8 hr after restart Daily aPTT, heparin level and CBC Monitor using aPTT until correlation with HL Transition back to Xarelto on 10/16 with pm dose.  Minda Ditto PharmD 08/18/2020, 12:19 PM

## 2020-08-18 NOTE — Care Management Important Message (Signed)
Important Message  Patient Details IM Letter given to the Patient Name: Brandi Bowen MRN: [id number] Date of Birth: 07-16-1941   Medicare Important Message Given:  Yes     Kerin Salen 08/18/2020, 11:10 AM

## 2020-08-18 NOTE — Progress Notes (Signed)
PROGRESS NOTE    Brandi Bowen   JAS:[id number]  DOB: Nov 08, 1940  DOA: 08/11/2020     6  PCP: Brandi Ginger, PA-C  CC: abdominal distension  Hospital Course: Brandi Bowen is an 79 yo CF with PMH uterine mass (u/k etiology currently), hypothyroidism, hyperlipidemia who presented to the hospital with abdominal distention.  She also has a known history of breast cancer in remission. She has recently been followed by GYN oncology for postmenopausal bleeding and underwent imaging studies which have shown large fibroids with ascites. Imaging studies performed in the ER were concerning for possible peritoneal carcinomatosis and possible underlying endometrial malignancy.  She also underwent duplex which revealed left peroneal DVT and was started on anticoagulation, transitioned to Xarelto and back to heparin drip on 08/16/2020. She previously underwent a paracentesis on 08/12/2020 which removed 4.8 L of peritoneal fluid however cytology was nondiagnostic.  She was evaluated by GYN oncology during hospitalization and recommended for tissue biopsy for diagnostic purposes.   She was able to be scheduled for percutaneous biopsy on 08/18/2020 with IR.  She underwent biopsy of omental fat pad performed under CT guidance with 5 core samples obtained and sent to pathology.  She also underwent repeat paracentesis on 08/18/2020 removing 2.1 L of fluid.  This did give her some further relief as her abdomen had become further distended during hospitalization.   Interval History:  Underwent biopsy and paracentesis this morning.  Seen back in her room after procedures with son bedside.  Her abdomen is still rather distended but does feel a little better after paracentesis.  They were appreciative for procedure being able to be done.  They have discussed discharge plans and she still wishes to go home at time of discharge and will have help of her family and friends.  Old records reviewed in assessment of this  patient  ROS: Constitutional: negative for chills and fevers, Respiratory: negative for cough, Cardiovascular: negative for chest pain and Gastrointestinal: positive for Abdominal distention  Assessment & Plan: * Fibroids -Large fibroid noted on CT abdomen/pelvis.  There is high suspicion for underlying malignancy - s/p CT guided biopsy with IR on 10/15; follow up results -Being followed by GYN oncology during hospitalization, appreciate assistance  Acute deep vein thrombosis (DVT) of left lower extremity ([place]) - left peroneal vein - likely provoked in setting of underlying suspected malignancy -Has been on heparin drip during hospitalization in anticipation of biopsy on 08/18/2020.  Now that biopsy has been completed, resume heparin drip and transition back to Xarelto on 08/19/2020  Ascites -Paracentesis performed on 08/12/2020 that was nondiagnostic -Patient was developing worsening abdominal distention/fullness since her last paracentesis.  She underwent repeat paracentesis on 08/18/2020, removing 2.1 L fluid. -She was seen in her room after biopsy and paracentesis with son bedside.  Distention was mildly improved along with her fullness sensation. -Given redevelopment of ascites within such a short amount of time, suspect she will need routine taps if treatment is not successful at reducing ascites amount pending biopsy results -Follow-up cytology from paracentesis today, 08/18/2020 as last cytology was nondiagnostic  Physical deconditioning - patient lives alone and does not seem to have 24/7 support. Children live out of town; she does have friends closeby for help per son -PT recommends home health at discharge with 24/7 supervision.  Although patient does not have this, she has discussed with her son and appears to still want to go home at time of discharge when able.   Antimicrobials: None  DVT prophylaxis: Heparin drip>> resuming Xarelto on 08/19/2020 Code Status:  Full Family Communication: Son bedside Disposition Plan: Status is: Inpatient  Remains inpatient appropriate because:Ongoing diagnostic testing needed not appropriate for outpatient work up, Unsafe d/c plan, IV treatments appropriate due to intensity of illness or inability to take PO and Inpatient level of care appropriate due to severity of illness   Dispo: The patient is from: Home              Anticipated d/c is to: Home              Anticipated d/c date is: 1 day              Patient currently is not medically stable to d/c.  Objective: Blood pressure 103/77, pulse 73, temperature 97.7 F (36.5 C), temperature source Oral, resp. rate 16, height 5\' 5"  (1.651 m), weight 86.2 kg, SpO2 94 %.  Examination: General appearance: alert, cooperative and no distress Head: Normocephalic, without obvious abnormality, atraumatic Eyes: EOMI Lungs: clear to auscultation bilaterally Heart: regular rate and rhythm and S1, S2 normal Abdomen: Less distended after paracentesis, irregular firmness now able to be appreciated throughout almost entire abdomen consistent with underlying enlarged uterus/tumor.  Bowel sounds hypoactive and distant Extremities: 1-2+ LE edema Skin: mobility and turgor normal Neurologic: Grossly normal  Consultants:   GYN onc  IR  Procedures:   n/a  Data Reviewed: I have personally reviewed following labs and imaging studies Results for orders placed or performed during the hospital encounter of 08/11/20 (from the past 24 hour(s))  APTT     Status: Abnormal   Collection Time: 08/17/20  2:05 PM  Result Value Ref Range   aPTT 133 (H) 24 - 36 seconds  APTT     Status: Abnormal   Collection Time: 08/18/20  1:00 AM  Result Value Ref Range   aPTT 54 (H) 24 - 36 seconds  Heparin level (unfractionated)     Status: Abnormal   Collection Time: 08/18/20  1:00 AM  Result Value Ref Range   Heparin Unfractionated 0.74 (H) 0.30 - 0.70 IU/mL  CBC     Status: Abnormal    Collection Time: 08/18/20  1:00 AM  Result Value Ref Range   WBC 12.2 (H) 4.0 - 10.5 K/uL   RBC 4.04 3.87 - 5.11 MIL/uL   Hemoglobin 10.8 (L) 12.0 - 15.0 g/dL   HCT 33.1 (L) 36 - 46 %   MCV 81.9 80.0 - 100.0 fL   MCH 26.7 26.0 - 34.0 pg   MCHC 32.6 30.0 - 36.0 g/dL   RDW 14.0 11.5 - 15.5 %   Platelets 481 (H) 150 - 400 K/uL   nRBC 0.0 0.0 - 0.2 %  Protime-INR     Status: Abnormal   Collection Time: 08/18/20  1:00 AM  Result Value Ref Range   Prothrombin Time 17.4 (H) 11.4 - 15.2 seconds   INR 1.5 (H) 0.8 - 1.2  Basic metabolic panel     Status: Abnormal   Collection Time: 08/18/20  1:00 AM  Result Value Ref Range   Sodium 135 135 - 145 mmol/L   Potassium 3.6 3.5 - 5.1 mmol/L   Chloride 99 98 - 111 mmol/L   CO2 25 22 - 32 mmol/L   Glucose, Bld 121 (H) 70 - 99 mg/dL   BUN 38 (H) 8 - 23 mg/dL   Creatinine, Ser 1.25 (H) 0.44 - 1.00 mg/dL   Calcium 7.2 (L) 8.9 - 10.3 mg/dL  GFR, Estimated 41 (L) >60 mL/min   Anion gap 11 5 - 15  Magnesium     Status: None   Collection Time: 08/18/20  1:00 AM  Result Value Ref Range   Magnesium 2.3 1.7 - 2.4 mg/dL    Recent Results (from the past 240 hour(s))  Respiratory Panel by RT PCR (Flu A&B, Covid) - Nasopharyngeal Swab     Status: None   Collection Time: 08/11/20  6:40 PM   Specimen: Nasopharyngeal Swab  Result Value Ref Range Status   SARS Coronavirus 2 by RT PCR NEGATIVE NEGATIVE Final    Comment: (NOTE) SARS-CoV-2 target nucleic acids are NOT DETECTED.  The SARS-CoV-2 RNA is generally detectable in upper respiratoy specimens during the acute phase of infection. The lowest concentration of SARS-CoV-2 viral copies this assay can detect is 131 copies/mL. A negative result does not preclude SARS-Cov-2 infection and should not be used as the sole basis for treatment or other patient management decisions. A negative result may occur with  improper specimen collection/handling, submission of specimen other than nasopharyngeal swab,  presence of viral mutation(s) within the areas targeted by this assay, and inadequate number of viral copies (<131 copies/mL). A negative result must be combined with clinical observations, patient history, and epidemiological information. The expected result is Negative.  Fact Sheet for Patients:  PinkCheek.be  Fact Sheet for Healthcare Providers:  GravelBags.it  This test is no t yet approved or cleared by the [place] FDA and  has been authorized for detection and/or diagnosis of SARS-CoV-2 by FDA under an Emergency Use Authorization (EUA). This EUA will remain  in effect (meaning this test can be used) for the duration of the COVID-19 declaration under Section 564(b)(1) of the Act, 21 U.S.C. section 360bbb-3(b)(1), unless the authorization is terminated or revoked sooner.     Influenza A by PCR NEGATIVE NEGATIVE Final   Influenza B by PCR NEGATIVE NEGATIVE Final    Comment: (NOTE) The Xpert Xpress SARS-CoV-2/FLU/RSV assay is intended as an aid in  the diagnosis of influenza from Nasopharyngeal swab specimens and  should not be used as a sole basis for treatment. Nasal washings and  aspirates are unacceptable for Xpert Xpress SARS-CoV-2/FLU/RSV  testing.  Fact Sheet for Patients: PinkCheek.be  Fact Sheet for Healthcare Providers: GravelBags.it  This test is not yet approved or cleared by the [place] FDA and  has been authorized for detection and/or diagnosis of SARS-CoV-2 by  FDA under an Emergency Use Authorization (EUA). This EUA will remain  in effect (meaning this test can be used) for the duration of the  Covid-19 declaration under Section 564(b)(1) of the Act, 21  U.S.C. section 360bbb-3(b)(1), unless the authorization is  terminated or revoked. Performed at [facility], [place] [address], [place], [place] [address]   Gram  stain     Status: None   Collection Time: 08/12/20  2:02 PM   Specimen: Peritoneal Washings  Result Value Ref Range Status   Specimen Description   Final    PERITONEAL Performed at [place] [address], [place], [place] [address]    Special Requests   Final    NONE Performed at [facility], [place] [address], [place], [place] [address]    Gram Stain   Final    RARE WBC PRESENT, PREDOMINANTLY MONONUCLEAR NO ORGANISMS SEEN Performed at [facility], [place] [address], [place], [place] [address]    Report Status 08/14/2020 FINAL  Final     Radiology Studies: US Paracentesis  Result Date: 08/18/2020 INDICATION: Patient with history of pelvic mass, carcinomatosis, recurrent ascites; request received for diagnostic and therapeutic paracentesis. EXAM: ULTRASOUND GUIDED DIAGNOSTIC AND THERAPEUTIC PARACENTESIS MEDICATIONS: 1% lidocaine to skin and subcutaneous tissue COMPLICATIONS: None immediate. PROCEDURE: Informed written consent was obtained from the patient after a discussion of the risks, benefits and alternatives to treatment. A timeout was performed prior to the initiation of the procedure. Initial ultrasound scanning demonstrates a small to moderate amount of ascites within the left mid to lower abdominal quadrant. The left mid to lower abdomen was prepped and draped in the usual sterile fashion. 1% lidocaine was used for local anesthesia. Following this, a 19 gauge, 10-cm, Yueh catheter was introduced. An ultrasound image was saved for documentation purposes. The paracentesis was performed. The catheter was removed and a dressing was applied. The patient tolerated the procedure well without immediate post procedural complication. FINDINGS: A total of approximately 2.1 liters of amber fluid was removed. Samples were sent to the laboratory as requested by the clinical team. IMPRESSION: Successful ultrasound-guided diagnostic and therapeutic  paracentesis yielding 2.1 liters of peritoneal fluid. Read by: Rowe Robert, PA-C Electronically Signed   By: Aletta Edouard M.D.   On: 08/18/2020 10:08   CT BIOPSY  Result Date: 08/18/2020 CLINICAL DATA:  Large pelvic mass, ascites and evidence of carcinomatosis by imaging. Prior paracentesis did not yield malignant cells by analysis of ascites and additional CT-guided omental biopsy is now performed. EXAM: CT GUIDED CORE BIOPSY OF OMENTUM ANESTHESIA/SEDATION: 2.0 mg IV Versed Total Moderate Sedation Time:  10 minutes. The patient's level of consciousness and physiologic status were continuously monitored during the procedure by Radiology nursing. The patient also received 4 mg IV Zofran for nausea. PROCEDURE: The procedure risks, benefits, and alternatives were explained to the patient. Questions regarding the procedure were encouraged and answered. The patient understands and consents to the procedure. A time-out was performed prior to initiating the procedure. Prior to the biopsy, a separate paracentesis procedure was performed to remove ascites. This will be dictated separately. Localizing CT of the abdomen and pelvis was performed in a supine position. The anterior abdominal wall was prepped with chlorhexidine in a sterile fashion, and a sterile drape was applied covering the operative field. A sterile gown and sterile gloves were used for the procedure. Local anesthesia was provided with 1% Lidocaine. Under CT guidance, a 17 gauge trocar needle was advanced into anterior abdominal omental fat from a midline approach towards the left anterior peritoneal cavity. After confirming needle tip position, a total of 5 separate coaxial 18 gauge core biopsy samples were obtained of omentum and placed in formalin. Additional CT was performed. COMPLICATIONS: None FINDINGS: An area of anterior omental fat demonstrating infiltrative stranding was chosen inferior to the transverse colon. Solid tissue was obtained.  IMPRESSION: CT-guided core biopsy performed of omental fat in the midline and left anterior abdomen just inferior to the transverse colon. Electronically Signed   By: Aletta Edouard M.D.   On: 08/18/2020 09:43   US Paracentesis  Final Result    CT BIOPSY  Final Result    DG Abd 1 View  Final Result    CT ABDOMEN PELVIS WO CONTRAST  Final Result    US Paracentesis  Final Result    VAS [place] LOWER EXTREMITY VENOUS (DVT) (ONLY [facility] & [facility] 7a-7p)  Final Result    DG Chest 2 View  Final Result    CT  Abdomen Pelvis W Contrast  Final Result      Scheduled Meds:  atenolol  50 mg Oral QHS   docusate sodium  100 mg Oral BID   feeding supplement  237 mL Oral TID with meals   lactulose  300 mL Rectal Once   lidocaine       midazolam       ondansetron       polyethylene glycol  17 g Oral Daily   PRN Meds: bisacodyl, ondansetron (ZOFRAN) IV, polyethylene glycol, promethazine, traMADol Continuous Infusions:  heparin        LOS: 6 days  Time spent: Greater than 50% of the 35 minute visit was spent in counseling/coordination of care for the patient as laid out in the A&P.   Dwyane Dee, MD [facility] 08/18/2020, 1:19 PM

## 2020-08-18 NOTE — Evaluation (Signed)
Occupational Therapy Evaluation Patient Details Name: AUDRIELLA BLAKELEY MRN: [id number] DOB: 1941-08-18 Today's Date: 08/18/2020    History of Present Illness Ms. Abeln is an 79 yo CF with PMH uterine mass (u/k etiology currently), hypothyroidism, hyperlipidemia who presented to the hospital 08/11/20 with abdominal distention. PMH: breast cancer in remission.Recently W/U for  postmenopausal bleeding with imaging studies showing large fibroids with ascites.Positive for left peroneal DVT S/P paracentesis on 08/12/2020 which removed 4.8 L of peritoneal fluid   Clinical Impression   Mrs. 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 Patient ambulated in room with min guard for safety, min assist for lower body dressing and otherwise setup and seated position for ADLs due to decreased activity tolerance. Patient ambulated to the bathroom and one loop in room and then returned to seated position. No overt loss of balance or safety concerns. However, patient will be returning home at discharge with only a couple of days assistance from sons. Long discussion with patient and son in regards to strategies to implement at home to improve safety including use of shower chair, carrying cell phone at all times, having prepared meals and some form of life alert or monitoring device. Patient will benefit from skilled OT services while in hospital to improve deficits in order to return home at discharge. Recommend HH OT and PT services at discharge to optimize patient's physical abilities in order for her to return to PLOF.    Follow Up Recommendations  Home health OT    Equipment Recommendations  Tub/shower seat    Recommendations for Other Services       Precautions / Restrictions Precautions Precautions: Fall Restrictions Weight Bearing Restrictions: No       Mobility Bed Mobility Overal bed mobility: Modified Independent             General bed mobility comments: increased time and use of bed rail for bed mobility  Transfers Overall transfer level: Needs assistance Equipment used: Rolling walker (2 wheeled);1 person hand held assist Transfers: Sit to/from Stand Sit to Stand: Supervision         General transfer comment: min guard to ambulate in room while pushing IV pole. Decreased activity tolerance and requests to go back to bed.    Balance Overall balance assessment: No apparent balance deficits (not formally assessed)                                         ADL either performed or assessed with clinical judgement   ADL Overall ADL's : Needs assistance/impaired Eating/Feeding: Independent   Grooming: Min guard;Standing   Upper Body Bathing: Set up;Sitting   Lower Body Bathing: Set up;Sit to/from stand   Upper Body Dressing : Set up;Sitting   Lower Body Dressing: Minimal assistance;Sit to/from stand Lower Body Dressing Details (indicate cue type and reason): min assist to donn socks Toilet Transfer: Min guard;Ambulation;Regular Toilet   Toileting- Water quality scientist and Hygiene: Min guard       Functional mobility during ADLs: Min guard       Vision   Vision Assessment?: No apparent visual deficits     Perception     Praxis      Pertinent Vitals/Pain Pain Assessment: No/denies pain     Hand  Dominance Right   Extremity/Trunk Assessment Upper Extremity Assessment Upper Extremity Assessment: Overall WFL for tasks assessed   Lower Extremity Assessment Lower Extremity Assessment: Defer to PT evaluation   Cervical / Trunk Assessment Cervical / Trunk Assessment: Normal   Communication Communication Communication: No difficulties   Cognition Arousal/Alertness: Awake/alert Behavior During Therapy: WFL for tasks assessed/performed Overall Cognitive Status: Within Functional  Limits for tasks assessed                                     General Comments       Exercises     Shoulder Instructions      Home Living Family/patient expects to be discharged to:: Private residence Living Arrangements: Alone Available Help at Discharge: Family;Available PRN/intermittently Type of Home: House Home Access: Stairs to enter [employer] of Steps: 1   Home Layout: One level     Bathroom Shower/Tub: Occupational psychologist: Standard     Home Equipment: Environmental consultant - 4 wheels   Additional Comments: Lives alone, has some support initially for a couple of days but will then have intermittent assistance.      Prior Functioning/Environment Level of Independence: Independent        Comments: drives and works 2-3 days at a [employer].        OT Problem List: Decreased activity tolerance;Decreased knowledge of use of DME or AE      OT Treatment/Interventions: Self-care/ADL training;Therapeutic exercise;DME and/or AE instruction;Therapeutic activities;Patient/family education    OT Goals(Current goals can be found in the care plan section) Acute Rehab OT Goals Patient Stated Goal: To go home soon OT Goal Formulation: With patient Time For Goal Achievement: 09/01/20 Potential to Achieve Goals: Good  OT Frequency: Min 2X/week   Barriers to D/C: Decreased caregiver support          Co-evaluation              AM-PAC OT "6 Clicks" Daily Activity     Outcome Measure Help from another person eating meals?: None Help from another person taking care of personal grooming?: A Little Help from another person toileting, which includes using toliet, bedpan, or urinal?: A Little Help from another person bathing (including washing, rinsing, drying)?: A Little Help from another person to put on and taking off regular upper body clothing?: A Little Help from another person to put on and taking off regular lower body  clothing?: A Little 6 Click Score: 19   End of Session Equipment Utilized During Treatment: Gait belt Nurse Communication: Mobility status  Activity Tolerance: Patient limited by fatigue Patient left: in bed;with call bell/phone within reach;with family/visitor present  OT Visit Diagnosis: Muscle weakness (generalized) (M62.81)                Time: 0109-3235 OT Time Calculation (min): 26 min Charges:  OT General Charges $OT Visit: 1 Visit OT Evaluation $OT Eval Low Complexity: 1 Low OT Treatments $Self Care/Home Management : 8-22 mins  Kamar Callender, OTR/L [place]  Office [phone] Pager: [place] 08/18/2020, 4:58 PM

## 2020-08-19 ENCOUNTER — Other Ambulatory Visit: Payer: Medicare HMO

## 2020-08-19 DIAGNOSIS — I82452 Acute embolism and thrombosis of left peroneal vein: Secondary | ICD-10-CM | POA: Diagnosis not present

## 2020-08-19 DIAGNOSIS — K567 Ileus, unspecified: Secondary | ICD-10-CM | POA: Insufficient documentation

## 2020-08-19 DIAGNOSIS — R112 Nausea with vomiting, unspecified: Secondary | ICD-10-CM | POA: Diagnosis not present

## 2020-08-19 DIAGNOSIS — D219 Benign neoplasm of connective and other soft tissue, unspecified: Secondary | ICD-10-CM | POA: Diagnosis not present

## 2020-08-19 LAB — CBC
HCT: 40.7 % (ref 36.0–46.0)
Hemoglobin: 12.9 g/dL (ref 12.0–15.0)
MCH: 26.4 pg (ref 26.0–34.0)
MCHC: 31.7 g/dL (ref 30.0–36.0)
MCV: 83.2 fL (ref 80.0–100.0)
Platelets: 466 10*3/uL — ABNORMAL HIGH (ref 150–400)
RBC: 4.89 MIL/uL (ref 3.87–5.11)
RDW: 14.1 % (ref 11.5–15.5)
WBC: 14.9 10*3/uL — ABNORMAL HIGH (ref 4.0–10.5)
nRBC: 0.1 % (ref 0.0–0.2)

## 2020-08-19 LAB — APTT
aPTT: >200 seconds (ref 24–36)
aPTT: 34 seconds (ref 24–36)

## 2020-08-19 LAB — BASIC METABOLIC PANEL
Anion gap: 14 (ref 5–15)
BUN: 40 mg/dL — ABNORMAL HIGH (ref 8–23)
CO2: 29 mmol/L (ref 22–32)
Calcium: 8.8 mg/dL — ABNORMAL LOW (ref 8.9–10.3)
Chloride: 92 mmol/L — ABNORMAL LOW (ref 98–111)
Creatinine, Ser: 1.55 mg/dL — ABNORMAL HIGH (ref 0.44–1.00)
GFR, Estimated: 31 mL/min — ABNORMAL LOW (ref >60)
Glucose, Bld: 136 mg/dL — ABNORMAL HIGH (ref 70–99)
Potassium: 4.2 mmol/L (ref 3.5–5.1)
Sodium: 135 mmol/L (ref 135–145)

## 2020-08-19 LAB — MAGNESIUM: Magnesium: 2.7 mg/dL — ABNORMAL HIGH (ref 1.7–2.4)

## 2020-08-19 LAB — HEPARIN LEVEL (UNFRACTIONATED)
Heparin Unfractionated: 1.24 IU/mL — ABNORMAL HIGH (ref 0.30–0.70)
Heparin Unfractionated: <0.1 IU/mL — ABNORMAL LOW (ref 0.30–0.70)

## 2020-08-19 MED ORDER — RIVAROXABAN 20 MG PO TABS: 20.0000 mg | ORAL_TABLET | Freq: Every day | ORAL | Status: DC

## 2020-08-19 MED ORDER — PHENOL 1.4 % MT LIQD
1.0000 | OROMUCOSAL | Status: DC | PRN
  Filled 2020-08-19: qty 177

## 2020-08-19 MED ORDER — SODIUM CHLORIDE 0.9 % IV SOLN: INTRAVENOUS | Status: DC

## 2020-08-19 MED ORDER — MORPHINE SULFATE (PF) 2 MG/ML IV SOLN
2.0000 mg | INTRAVENOUS | Status: DC | PRN
  Administered 2020-08-21 – 2020-08-24 (×4): 2 mg via INTRAVENOUS
  Filled 2020-08-19 (×4): qty 1

## 2020-08-19 MED ORDER — HEPARIN (PORCINE) 25000 UT/250ML-% IV SOLN
900.0000 [IU]/h | INTRAVENOUS | Status: DC
  Filled 2020-08-19: qty 250

## 2020-08-19 MED ORDER — HEPARIN (PORCINE) 25000 UT/250ML-% IV SOLN
800.0000 [IU]/h | INTRAVENOUS | Status: AC
  Filled 2020-08-19: qty 250

## 2020-08-19 MED ORDER — RIVAROXABAN 15 MG PO TABS: 15.0000 mg | ORAL_TABLET | Freq: Two times a day (BID) | ORAL | Status: DC

## 2020-08-19 MED ORDER — HEPARIN (PORCINE) 25000 UT/250ML-% IV SOLN
800.0000 [IU]/h | INTRAVENOUS | Status: DC
  Administered 2020-08-19: 800 [IU]/h via INTRAVENOUS
  Filled 2020-08-19: qty 250

## 2020-08-19 NOTE — Progress Notes (Signed)
MD notified of patient vomiting. No new orders at this time, will continue to monitor.

## 2020-08-19 NOTE — Progress Notes (Signed)
CRITICAL VALUE ALERT  Critical Value:  Aptt >200  Date & Time Notied:  08/19/20 0244  Provider Notified: Kennon Holter and Pharmacist  Orders Received/Actions taken: Orders to stop Heparin per Pharm and will start at new rate at 0400

## 2020-08-19 NOTE — Assessment & Plan Note (Addendum)
-   patient developed severe N/V on 10/16, approx 500 cc bilious vomitus; obtained CT on 10/17, no obvious obstruction/compression making this more likely ileus from underlying significant carcinomatosis - continue NGT - see peritoneal carcinomatosis

## 2020-08-19 NOTE — Progress Notes (Signed)
ANTICOAGULATION CONSULT NOTE - Follow Up Consult  Pharmacy Consult for heparin Indication: acute DVT  Allergies  Allergen Reactions   Shrimp [Shellfish Allergy]     Patient Measurements: Height: 5\' 5"  (165.1 cm) Weight: 86.2 kg (190 lb 0.6 oz) IBW/kg (Calculated) : 57 Heparin Dosing Weight: 76 kh  Vital Signs: Temp: 97.6 F (36.4 C) (10/16 0528) Temp Source: Oral (10/16 0528) BP: 98/79 (10/16 0528) Pulse Rate: 72 (10/16 0528)  Labs: Recent Labs    08/17/20 0559 08/17/20 0559 08/17/20 1405 08/18/20 0100 08/19/20 0030 08/19/20 0031  HGB 12.3  --   --  10.8*  --   --   HCT 38.2  --   --  33.1*  --   --   PLT 525*  --   --  481*  --   --   APTT 72*   < > 133* 54* >200*  --   LABPROT  --   --   --  17.4*  --   --   INR  --   --   --  1.5*  --   --   HEPARINUNFRC >2.20*  --   --  0.74*  --  1.24*  CREATININE 1.51*  --   --  1.25*  --   --    < > = values in this interval not displayed.    Estimated Creatinine Clearance: 38.9 mL/min (A) (by C-G formula based on SCr of 1.25 mg/dL (H)).   Assessment: Patient is an 79 y.o F presented to the ED on 10/8 with c/o abdominal distention and LE swelling. Abdominal CT on 10/8 showed "Large amount of ascites, and diffuse mesenteric soft tissue stranding and omental caking, highly suspicious for peritoneal carcinomatosis." LE doppler on 10/8 showed LLE DVT.  She was started on heparin drip on 10/8 for acute VTE, transitioned to xarelto on 10/9, and changed back to heparin drip  on 10/14 in anticipation for IR procedure on 10/15.  She underwent US guided paracentesis and biopsy of omental fat on 10/5 with heparin drip resumed on the same day after the procedure.  Since full anticoagulation with heparin drip was resumed after the procedure on 10/15, will resume xarelto back back today (10/16) at noon.  Discussed plan with Dr. Sabino Gasser.  Goal of Therapy:  Heparin level 0.3-0.7 units/ml Monitor platelets by anticoagulation protocol:  Yes   Plan:  - d/c heparin drip at noon - start xarelto 15mg  bid until 10/30, then start 20mg  daily on 10/31  Kinleigh Nault P 08/19/2020,9:06 AM

## 2020-08-19 NOTE — Progress Notes (Signed)
MD notified of patient feeling nauseated again, new order given. Will continue to monitor.

## 2020-08-19 NOTE — Progress Notes (Signed)
ANTICOAGULATION CONSULT NOTE - Follow Up Consult  Pharmacy Consult for heparin Indication: acute DVT  Allergies  Allergen Reactions  . Shrimp [Shellfish Allergy]     Patient Measurements: Height: 5\' 5"  (165.1 cm) Weight: 86.2 kg (190 lb 0.6 oz) IBW/kg (Calculated) : 57 Heparin Dosing Weight: 76 kg  Vital Signs: Temp: 97.7 F (36.5 C) (10/16 1434) Temp Source: Oral (10/16 1434) BP: 122/66 (10/16 1434) Pulse Rate: 75 (10/16 1434)  Labs: Recent Labs    08/17/20 0559 08/17/20 1405 08/18/20 0100 08/19/20 0030 08/19/20 0031 08/19/20 0839 08/19/20 1845  HGB 12.3  --  10.8*  --   --  12.9  --   HCT 38.2  --  33.1*  --   --  40.7  --   PLT 525*  --  481*  --   --  466*  --   APTT 72*   < > 54* >200*  --   --  34  LABPROT  --   --  17.4*  --   --   --   --   INR  --   --  1.5*  --   --   --   --   HEPARINUNFRC >2.20*  --  0.74*  --  1.24*  --  <0.10*  CREATININE 1.51*  --  1.25*  --   --  1.55*  --    < > = values in this interval not displayed.    Estimated Creatinine Clearance: 31.4 mL/min (A) (by C-G formula based on SCr of 1.55 mg/dL (H)).   Assessment: Patient is an 79 y.o F presented to the ED on 10/8 with c/o abdominal distention and LE swelling. Abdominal CT on 10/8 showed "Large amount of ascites, and diffuse mesenteric soft tissue stranding and omental caking, highly suspicious for peritoneal carcinomatosis." LE doppler on 10/8 showed LLE DVT.  She was started on heparin drip on 10/8 for acute VTE, transitioned to xarelto on 10/9, and changed back to heparin drip  on 10/14 in anticipation for IR procedure on 10/15.  She underwent US guided paracentesis and biopsy of omental fat on 10/5 with heparin drip resumed on the same day after the procedure.  Since full anticoagulation with heparin drip was resumed after the procedure on 10/15, will resume xarelto back back today (10/16) at noon.  Discussed plan with Dr. Sabino Gasser.  08/19/20 7:54 PM   Unable to resume Xarelto  due to vomiting, NG tube placed  Heparin continued and was off for a period of time but new bag resumed at 1530  HL undetectable, aPTT WNL  Level correlation at this point  No line issues or bleeding reported per RN  Goal of Therapy:  Heparin level 0.3-0.7 units/ml Monitor platelets by anticoagulation protocol: Yes   Plan:   Increase heparin infusion to 900 units/hr   Heparin level 8 hours after rate change  Daily HL/CBC  Ulice Dash D 08/19/2020,7:53 PM

## 2020-08-19 NOTE — Progress Notes (Signed)
PT Cancellation Note  Patient Details Name: AAMIYAH DERRICK MRN: [id number] DOB: [birth year]   Cancelled Treatment:     PT deferred this am.  RN advises pt with increased back pain and actively vomiting .  Will follow.   Sky Borboa 08/19/2020, 12:14 PM

## 2020-08-19 NOTE — Progress Notes (Signed)
ANTICOAGULATION CONSULT NOTE  Pharmacy Consult for Heparin  Indication: DVT  Allergies  Allergen Reactions   Shrimp [Shellfish Allergy]    Patient Measurements: Height: 5\' 5"  (165.1 cm) Weight: 86.2 kg (190 lb 0.6 oz) IBW/kg (Calculated) : 57 Heparin Dosing Weight: 76 kg  Vital Signs: Temp: 99 F (37.2 C) (10/15 2044) Temp Source: Oral (10/15 2044) BP: 101/67 (10/15 2044) Pulse Rate: 75 (10/15 2044)  Labs: Recent Labs    08/16/20 0532 08/16/20 0532 08/17/20 0559 08/17/20 0559 08/17/20 1405 08/18/20 0100 08/19/20 0030 08/19/20 0031  HGB 12.8   < > 12.3  --   --  10.8*  --   --   HCT 40.0  --  38.2  --   --  33.1*  --   --   PLT 528*  --  525*  --   --  481*  --   --   APTT  --   --  72*   < > 133* 54* >200*  --   LABPROT  --   --   --   --   --  17.4*  --   --   INR  --   --   --   --   --  1.5*  --   --   HEPARINUNFRC  --   --  >2.20*  --   --  0.74*  --  1.24*  CREATININE 1.43*  --  1.51*  --   --  1.25*  --   --    < > = values in this interval not displayed.    Estimated Creatinine Clearance: 38.9 mL/min (A) (by C-G formula based on SCr of 1.25 mg/dL (H)).  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 Patient was initiated on heparin infusion then transitioned to Xarelto.  Pharmacy consulted to transition back to heparin infusion due to planned biopsy. Last dose of Xarelto 15 mg 10/13 @ 0746.  08/19/20 2:49 AM   APTT >200, above goal range.  Heparin level 1.24- also elevated.  No bleeding or complications reported   Verified with RN that lab sample was drawn correctly from opposite arm and heparin has been infusing without interruptions since 4pm at 1000 units/hr  Goal of Therapy:  Heparin level 0.3-0.7 units/ml aPTT 66-102 seconds Monitor platelets by anticoagulation protocol: Yes   Plan:  Hold heparin x1 hour At 0400 today resume Heparin IV infusion at 800 units/hr APTT 8 hr after restart Daily  aPTT, heparin level and CBC Monitor using aPTT until correlation with HL Transition back to Xarelto on 10/16 with pm dose.  Netta Cedars, PharmD, BCPS 08/19/2020, 2:49 AM

## 2020-08-19 NOTE — Progress Notes (Signed)
PROGRESS NOTE    Brandi Bowen   ZOX:[id number]  DOB: [birth year]  DOA: 08/11/2020     7  PCP: [name], PA-C  CC: abdominal distension  Hospital Course: Ms. Hargett is an 79 yo CF with PMH uterine mass (u/k etiology currently), hypothyroidism, hyperlipidemia who presented to the hospital with abdominal distention.  She also has a known history of breast cancer in remission. She has recently been followed by GYN oncology for postmenopausal bleeding and underwent imaging studies which have shown large fibroids with ascites. Imaging studies performed in the ER were concerning for possible peritoneal carcinomatosis and possible underlying endometrial malignancy.  She also underwent duplex which revealed left peroneal DVT and was started on anticoagulation, transitioned to Xarelto and back to heparin drip on 08/16/2020. She previously underwent a paracentesis on 08/12/2020 which removed 4.8 L of peritoneal fluid however cytology was nondiagnostic.  She was evaluated by GYN oncology during hospitalization and recommended for tissue biopsy for diagnostic purposes.   She was able to be scheduled for percutaneous biopsy on 08/18/2020 with IR.  She underwent biopsy of omental fat pad performed under CT guidance with 5 core samples obtained and sent to pathology.  She also underwent repeat paracentesis on 08/18/2020 removing 2.1 L of fluid.  This did give her some further relief as her abdomen had become further distended during hospitalization.  Heparin drip was resumed after biopsies.  She was transitioned back to Xarelto on 08/19/2020.  She will continue on Xarelto 15 mg twice daily until 09/02/2020, then start Xarelto 20 mg daily on 09/03/2020.   Interval History:  Was resting in bed okay this morning but after my evaluation she was nauseous and vomiting bilious fluid. Will see how she does and if needed will place NGT otherwise possibly may still go home later today, pending how she and  son feel.   Old records reviewed in assessment of this patient  ROS: Constitutional: negative for chills and fevers, Respiratory: negative for cough, Cardiovascular: negative for chest pain and Gastrointestinal: positive for Abdominal distention  Assessment & Plan: * Fibroids -Large fibroid noted on CT abdomen/pelvis.  There is high suspicion for underlying malignancy - s/p CT guided biopsy with IR on 10/15; follow up results outpatient  -Being followed by GYN oncology during hospitalization, appreciate assistance  Nausea and vomiting - patient developed severe N/V on 10/16, approx 500 cc bilious vomitus; not eating much and with large underlying mass/compression her stomach is likely compressed significantly; her mass is palpable in her entire abdomen pretty much - for now try supportive care; this is a worrisome sign for going home and/or improving long term especially with nutrition status - use anti-emetics for now; if recurrent vomiting/pain will possibly place NGT  Acute deep vein thrombosis (DVT) of left lower extremity ([place]) - left peroneal vein - likely provoked in setting of underlying suspected malignancy -Has been on heparin drip during hospitalization in anticipation of biopsy on 08/18/2020.  Now that biopsy has been completed, resume heparin drip and transition back to Xarelto on 08/19/2020  Ascites -Paracentesis performed on 08/12/2020 that was nondiagnostic -Patient was developing worsening abdominal distention/fullness since her last paracentesis.  She underwent repeat paracentesis on 08/18/2020, removing 2.1 L fluid. -She was seen in her room after biopsy and paracentesis with son bedside.  Distention was mildly improved along with her fullness sensation. -Given redevelopment of ascites within such a short amount of time, suspect she will need routine taps if treatment is not successful  at reducing ascites amount pending biopsy results -Follow-up cytology from  paracentesis today, 08/18/2020 as last cytology was nondiagnostic  Physical deconditioning - patient lives alone and does not seem to have 24/7 support. Children live out of town; she does have friends closeby for help per son -PT recommends home health at discharge with 24/7 supervision.  Although patient does not have this, she has discussed with her son and appears to still want to go home at time of discharge when able.   Antimicrobials: None  DVT prophylaxis: Heparin drip>> resuming Xarelto on 08/19/2020 Code Status: Full Family Communication: Son bedside Disposition Plan: Status is: Inpatient  Remains inpatient appropriate because:Ongoing diagnostic testing needed not appropriate for outpatient work up, Unsafe d/c plan, IV treatments appropriate due to intensity of illness or inability to take PO and Inpatient level of care appropriate due to severity of illness   Dispo: The patient is from: Home              Anticipated d/c is to: Home              Anticipated d/c date is: 1 day              Patient currently is not medically stable to d/c.  Objective: Blood pressure 106/73, pulse 76, temperature 97.6 F (36.4 C), temperature source Oral, resp. rate 20, height 5\' 5"  (1.651 m), weight 86.2 kg, SpO2 93 %.  Examination: General appearance: alert, cooperative and no distress Head: Normocephalic, without obvious abnormality, atraumatic Eyes: EOMI Lungs: clear to auscultation bilaterally Heart: regular rate and rhythm and S1, S2 normal Abdomen: Less distended after paracentesis, irregular firmness now able to be appreciated throughout almost entire abdomen consistent with underlying enlarged uterus/tumor.  Bowel sounds hypoactive and distant Extremities: 1-2+ LE edema Skin: mobility and turgor normal Neurologic: Grossly normal  Consultants:   GYN onc  IR  Procedures:   n/a  Data Reviewed: I have personally reviewed following labs and imaging studies Results for  orders placed or performed during the hospital encounter of 08/11/20 (from the past 24 hour(s))  APTT     Status: Abnormal   Collection Time: 08/19/20 12:30 AM  Result Value Ref Range   aPTT >200 (HH) 24 - 36 seconds  Heparin level (unfractionated)     Status: Abnormal   Collection Time: 08/19/20 12:31 AM  Result Value Ref Range   Heparin Unfractionated 1.24 (H) 0.30 - 0.70 IU/mL  CBC     Status: Abnormal   Collection Time: 08/19/20  8:39 AM  Result Value Ref Range   WBC 14.9 (H) 4.0 - 10.5 K/uL   RBC 4.89 3.87 - 5.11 MIL/uL   Hemoglobin 12.9 12.0 - 15.0 g/dL   HCT 40.7 36 - 46 %   MCV 83.2 80.0 - 100.0 fL   MCH 26.4 26.0 - 34.0 pg   MCHC 31.7 30.0 - 36.0 g/dL   RDW 14.1 11.5 - 15.5 %   Platelets 466 (H) 150 - 400 K/uL   nRBC 0.1 0.0 - 0.2 %  Basic metabolic panel     Status: Abnormal   Collection Time: 08/19/20  8:39 AM  Result Value Ref Range   Sodium 135 135 - 145 mmol/L   Potassium 4.2 3.5 - 5.1 mmol/L   Chloride 92 (L) 98 - 111 mmol/L   CO2 29 22 - 32 mmol/L   Glucose, Bld 136 (H) 70 - 99 mg/dL   BUN 40 (H) 8 - 23 mg/dL   Creatinine,  Ser 1.55 (H) 0.44 - 1.00 mg/dL   Calcium 8.8 (L) 8.9 - 10.3 mg/dL   GFR, Estimated 31 (L) >60 mL/min   Anion gap 14 5 - 15  Magnesium     Status: Abnormal   Collection Time: 08/19/20  8:39 AM  Result Value Ref Range   Magnesium 2.7 (H) 1.7 - 2.4 mg/dL    Recent Results (from the past 240 hour(s))  Respiratory Panel by RT PCR (Flu A&B, Covid) - Nasopharyngeal Swab     Status: None   Collection Time: 08/11/20  6:40 PM   Specimen: Nasopharyngeal Swab  Result Value Ref Range Status   SARS Coronavirus 2 by RT PCR NEGATIVE NEGATIVE Final    Comment: (NOTE) SARS-CoV-2 target nucleic acids are NOT DETECTED.  The SARS-CoV-2 RNA is generally detectable in upper respiratoy specimens during the acute phase of infection. The lowest concentration of SARS-CoV-2 viral copies this assay can detect is 131 copies/mL. A negative result does not  preclude SARS-Cov-2 infection and should not be used as the sole basis for treatment or other patient management decisions. A negative result may occur with  improper specimen collection/handling, submission of specimen other than nasopharyngeal swab, presence of viral mutation(s) within the areas targeted by this assay, and inadequate number of viral copies (<131 copies/mL). A negative result must be combined with clinical observations, patient history, and epidemiological information. The expected result is Negative.  Fact Sheet for Patients:  PinkCheek.be  Fact Sheet for Healthcare Providers:  GravelBags.it  This test is no t yet approved or cleared by the [place] FDA and  has been authorized for detection and/or diagnosis of SARS-CoV-2 by FDA under an Emergency Use Authorization (EUA). This EUA will remain  in effect (meaning this test can be used) for the duration of the COVID-19 declaration under Section 564(b)(1) of the Act, 21 U.S.C. section 360bbb-3(b)(1), unless the authorization is terminated or revoked sooner.     Influenza A by PCR NEGATIVE NEGATIVE Final   Influenza B by PCR NEGATIVE NEGATIVE Final    Comment: (NOTE) The Xpert Xpress SARS-CoV-2/FLU/RSV assay is intended as an aid in  the diagnosis of influenza from Nasopharyngeal swab specimens and  should not be used as a sole basis for treatment. Nasal washings and  aspirates are unacceptable for Xpert Xpress SARS-CoV-2/FLU/RSV  testing.  Fact Sheet for Patients: PinkCheek.be  Fact Sheet for Healthcare Providers: GravelBags.it  This test is not yet approved or cleared by the [place] FDA and  has been authorized for detection and/or diagnosis of SARS-CoV-2 by  FDA under an Emergency Use Authorization (EUA). This EUA will remain  in effect (meaning this test can be used) for the  duration of the  Covid-19 declaration under Section 564(b)(1) of the Act, 21  U.S.C. section 360bbb-3(b)(1), unless the authorization is  terminated or revoked. Performed at [facility], [place] [address], [place], [place] [address]   Gram stain     Status: None   Collection Time: 08/12/20  2:02 PM   Specimen: Peritoneal Washings  Result Value Ref Range Status   Specimen Description   Final    PERITONEAL Performed at [place] [address] [place], [place] [address]    Special Requests   Final    NONE Performed at [facility], [place] [address], [place], [place] [address]    Gram Stain   Final    RARE WBC PRESENT, PREDOMINANTLY MONONUCLEAR NO ORGANISMS SEEN Performed at  [facility], [place] [address], [place], [place] [address]    Report Status 08/14/2020 FINAL  Final     Radiology Studies: US Paracentesis  Result Date: 08/18/2020 INDICATION: Patient with history of pelvic mass, carcinomatosis, recurrent ascites; request received for diagnostic and therapeutic paracentesis. EXAM: ULTRASOUND GUIDED DIAGNOSTIC AND THERAPEUTIC PARACENTESIS MEDICATIONS: 1% lidocaine to skin and subcutaneous tissue COMPLICATIONS: None immediate. PROCEDURE: Informed written consent was obtained from the patient after a discussion of the risks, benefits and alternatives to treatment. A timeout was performed prior to the initiation of the procedure. Initial ultrasound scanning demonstrates a small to moderate amount of ascites within the left mid to lower abdominal quadrant. The left mid to lower abdomen was prepped and draped in the usual sterile fashion. 1% lidocaine was used for local anesthesia. Following this, a 19 gauge, 10-cm, Yueh catheter was introduced. An ultrasound image was saved for documentation purposes. The paracentesis was performed. The catheter was removed and a dressing was applied. The patient tolerated the procedure well  without immediate post procedural complication. FINDINGS: A total of approximately 2.1 liters of amber fluid was removed. Samples were sent to the laboratory as requested by the clinical team. IMPRESSION: Successful ultrasound-guided diagnostic and therapeutic paracentesis yielding 2.1 liters of peritoneal fluid. Read by: Rowe Robert, PA-C Electronically Signed   By: Aletta Edouard M.D.   On: 08/18/2020 10:08   CT BIOPSY  Result Date: 08/18/2020 CLINICAL DATA:  Large pelvic mass, ascites and evidence of carcinomatosis by imaging. Prior paracentesis did not yield malignant cells by analysis of ascites and additional CT-guided omental biopsy is now performed. EXAM: CT GUIDED CORE BIOPSY OF OMENTUM ANESTHESIA/SEDATION: 2.0 mg IV Versed Total Moderate Sedation Time:  10 minutes. The patient's level of consciousness and physiologic status were continuously monitored during the procedure by Radiology nursing. The patient also received 4 mg IV Zofran for nausea. PROCEDURE: The procedure risks, benefits, and alternatives were explained to the patient. Questions regarding the procedure were encouraged and answered. The patient understands and consents to the procedure. A time-out was performed prior to initiating the procedure. Prior to the biopsy, a separate paracentesis procedure was performed to remove ascites. This will be dictated separately. Localizing CT of the abdomen and pelvis was performed in a supine position. The anterior abdominal wall was prepped with chlorhexidine in a sterile fashion, and a sterile drape was applied covering the operative field. A sterile gown and sterile gloves were used for the procedure. Local anesthesia was provided with 1% Lidocaine. Under CT guidance, a 17 gauge trocar needle was advanced into anterior abdominal omental fat from a midline approach towards the left anterior peritoneal cavity. After confirming needle tip position, a total of 5 separate coaxial 18 gauge core biopsy  samples were obtained of omentum and placed in formalin. Additional CT was performed. COMPLICATIONS: None FINDINGS: An area of anterior omental fat demonstrating infiltrative stranding was chosen inferior to the transverse colon. Solid tissue was obtained. IMPRESSION: CT-guided core biopsy performed of omental fat in the midline and left anterior abdomen just inferior to the transverse colon. Electronically Signed   By: Aletta Edouard M.D.   On: 08/18/2020 09:43   US Paracentesis  Final Result    CT BIOPSY  Final Result    DG Abd 1 View  Final Result    CT ABDOMEN PELVIS WO CONTRAST  Final Result    US Paracentesis  Final Result    VAS [place] LOWER EXTREMITY VENOUS (DVT) (ONLY [facility] &  [facility] 7a-7p)  Final Result    DG Chest 2 View  Final Result    CT Abdomen Pelvis W Contrast  Final Result      Scheduled Meds:  atenolol  50 mg Oral QHS   docusate sodium  100 mg Oral BID   feeding supplement  237 mL Oral TID with meals   lactulose  300 mL Rectal Once   polyethylene glycol  17 g Oral Daily   rivaroxaban  15 mg Oral BID WC   Followed by   Derrill Memo ON 09/03/2020] rivaroxaban  20 mg Oral Q supper   PRN Meds: bisacodyl, ondansetron (ZOFRAN) IV, polyethylene glycol, promethazine, traMADol Continuous Infusions:  heparin 800 Units/hr (08/19/20 0400)      LOS: 7 days  Time spent: Greater than 50% of the 35 minute visit was spent in counseling/coordination of care for the patient as laid out in the A&P.   Dwyane Dee, MD [facility] 08/19/2020, 9:29 AM

## 2020-08-20 ENCOUNTER — Inpatient Hospital Stay (HOSPITAL_COMMUNITY): Payer: Medicare HMO

## 2020-08-20 DIAGNOSIS — C786 Secondary malignant neoplasm of retroperitoneum and peritoneum: Secondary | ICD-10-CM | POA: Diagnosis not present

## 2020-08-20 DIAGNOSIS — J90 Pleural effusion, not elsewhere classified: Secondary | ICD-10-CM | POA: Diagnosis not present

## 2020-08-20 DIAGNOSIS — I82452 Acute embolism and thrombosis of left peroneal vein: Secondary | ICD-10-CM | POA: Diagnosis not present

## 2020-08-20 DIAGNOSIS — K567 Ileus, unspecified: Secondary | ICD-10-CM | POA: Diagnosis not present

## 2020-08-20 DIAGNOSIS — I2699 Other pulmonary embolism without acute cor pulmonale: Secondary | ICD-10-CM

## 2020-08-20 LAB — CBC WITH DIFFERENTIAL/PLATELET
Abs Immature Granulocytes: 0.14 10*3/uL — ABNORMAL HIGH (ref 0.00–0.07)
Basophils Absolute: 0 10*3/uL (ref 0.0–0.1)
Basophils Relative: 0 %
Eosinophils Absolute: 0 10*3/uL (ref 0.0–0.5)
Eosinophils Relative: 0 %
HCT: 37.7 % (ref 36.0–46.0)
Hemoglobin: 11.9 g/dL — ABNORMAL LOW (ref 12.0–15.0)
Immature Granulocytes: 1 %
Lymphocytes Relative: 6 %
Lymphs Abs: 0.8 10*3/uL (ref 0.7–4.0)
MCH: 26.7 pg (ref 26.0–34.0)
MCHC: 31.6 g/dL (ref 30.0–36.0)
MCV: 84.7 fL (ref 80.0–100.0)
Monocytes Absolute: 1.1 10*3/uL — ABNORMAL HIGH (ref 0.1–1.0)
Monocytes Relative: 7 %
Neutro Abs: 12.8 10*3/uL — ABNORMAL HIGH (ref 1.7–7.7)
Neutrophils Relative %: 86 %
Platelets: 452 10*3/uL — ABNORMAL HIGH (ref 150–400)
RBC: 4.45 MIL/uL (ref 3.87–5.11)
RDW: 14.2 % (ref 11.5–15.5)
WBC: 14.9 10*3/uL — ABNORMAL HIGH (ref 4.0–10.5)
nRBC: 0 % (ref 0.0–0.2)

## 2020-08-20 LAB — BASIC METABOLIC PANEL
Anion gap: 15 (ref 5–15)
BUN: 34 mg/dL — ABNORMAL HIGH (ref 8–23)
CO2: 31 mmol/L (ref 22–32)
Calcium: 8.6 mg/dL — ABNORMAL LOW (ref 8.9–10.3)
Chloride: 92 mmol/L — ABNORMAL LOW (ref 98–111)
Creatinine, Ser: 1.4 mg/dL — ABNORMAL HIGH (ref 0.44–1.00)
GFR, Estimated: 35 mL/min — ABNORMAL LOW (ref >60)
Glucose, Bld: 107 mg/dL — ABNORMAL HIGH (ref 70–99)
Potassium: 3.6 mmol/L (ref 3.5–5.1)
Sodium: 138 mmol/L (ref 135–145)

## 2020-08-20 LAB — HEPARIN LEVEL (UNFRACTIONATED): Heparin Unfractionated: <0.1 IU/mL — ABNORMAL LOW (ref 0.30–0.70)

## 2020-08-20 LAB — MAGNESIUM: Magnesium: 2.5 mg/dL — ABNORMAL HIGH (ref 1.7–2.4)

## 2020-08-20 MED ORDER — IOHEXOL 300 MG/ML  SOLN
75.0000 mL | Freq: Once | INTRAMUSCULAR | Status: AC | PRN
  Administered 2020-08-20: 75 mL via INTRAVENOUS

## 2020-08-20 MED ORDER — ENOXAPARIN SODIUM 100 MG/ML ~~LOC~~ SOLN
85.0000 mg | Freq: Two times a day (BID) | SUBCUTANEOUS | Status: DC
  Administered 2020-08-20 – 2020-08-23 (×7): 85 mg via SUBCUTANEOUS
  Filled 2020-08-20 (×7): qty 1

## 2020-08-20 MED ORDER — ENOXAPARIN SODIUM 100 MG/ML ~~LOC~~ SOLN: 1.0000 mg/kg | Freq: Two times a day (BID) | SUBCUTANEOUS | Status: DC

## 2020-08-20 MED ORDER — HEPARIN (PORCINE) 25000 UT/250ML-% IV SOLN
1100.0000 [IU]/h | INTRAVENOUS | Status: DC
  Filled 2020-08-20: qty 250

## 2020-08-20 NOTE — Progress Notes (Signed)
ANTICOAGULATION CONSULT NOTE - Follow Up Consult  Pharmacy Consult for heparin Indication: acute DVT  Allergies  Allergen Reactions  . Shrimp [Shellfish Allergy]     Patient Measurements: Height: 5\' 5"  (165.1 cm) Weight: 86.2 kg (190 lb 0.6 oz) IBW/kg (Calculated) : 57 Heparin Dosing Weight: 76 kg  Vital Signs: Temp: 98.2 F (36.8 C) (10/17 0510) Temp Source: Oral (10/17 0510) BP: 111/75 (10/17 0510) Pulse Rate: 79 (10/17 0510)  Labs: Recent Labs    08/18/20 0100 08/18/20 0100 08/19/20 0030 08/19/20 0031 08/19/20 0839 08/19/20 1845 08/20/20 0627  HGB 10.8*   < >  --   --  12.9  --  11.9*  HCT 33.1*  --   --   --  40.7  --  37.7  PLT 481*  --   --   --  466*  --  452*  APTT 54*  --  >200*  --   --  34  --   LABPROT 17.4*  --   --   --   --   --   --   INR 1.5*  --   --   --   --   --   --   HEPARINUNFRC 0.74*   < >  --  1.24*  --  <0.10* <0.10*  CREATININE 1.25*  --   --   --  1.55*  --  1.40*   < > = values in this interval not displayed.    Estimated Creatinine Clearance: 34.8 mL/min (A) (by C-G formula based on SCr of 1.4 mg/dL (H)).   Assessment: Patient is an 79 y.o F presented to the ED on 10/8 with c/o abdominal distention and LE swelling. Abdominal CT on 10/8 showed "Large amount of ascites, and diffuse mesenteric soft tissue stranding and omental caking, highly suspicious for peritoneal carcinomatosis." LE doppler on 10/8 showed LLE DVT.  She was started on heparin drip on 10/8 for acute VTE, transitioned to xarelto on 10/9, and changed back to heparin drip  on 10/14 in anticipation for IR procedure on 10/15.  She underwent US guided paracentesis and biopsy of omental fat on 10/5 with heparin drip resumed on the same day after the procedure.   Today, 08/20/2020: - heparin level is undetectable. Per pt's RN, no issue with IV line and no bleeding noted - cbc stable  Goal of Therapy:  Heparin level 0.3-0.7 units/ml Monitor platelets by anticoagulation  protocol: Yes   Plan:  - Increase heparin infusion to 1100 units/hr  - Heparin level 8 hours after rate change - Daily HL/CBC  Claudeen Leason P 08/20/2020,9:56 AM

## 2020-08-20 NOTE — Assessment & Plan Note (Addendum)
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  They also would like the NG tube to stay in since it provides the most relief from her severe nausea/vomiting.  She has had no appetite and mostly enjoys having ice chips -Prognosis is poor and patient would likely not survive more than 2 weeks on her current decline

## 2020-08-20 NOTE — Assessment & Plan Note (Signed)
-   large left pleural effusion, considered malignant until proven otherwise at this point - patient is not SOB or hypoxic; for now will leave alone unless symptomatic then pursue therapeutic relief (possibly may help with diagnosis pending omental bx results) - monitor for now

## 2020-08-20 NOTE — Progress Notes (Signed)
Occupational Therapy Treatment Patient Details Name: Brandi Bowen MRN: [id number] DOB: [birth year] Today's Date: 08/20/2020    History of present illness Ms. Brandi Bowen is an 79 yo CF with PMH uterine mass (u/k etiology currently), hypothyroidism, hyperlipidemia who presented to the hospital 08/11/20 with abdominal distention. PMH: breast cancer in remission.Recently W/U for  postmenopausal bleeding with imaging studies showing large fibroids with ascites.Positive for left peroneal DVT S/P paracentesis on 08/12/2020 which removed 4.8 L of peritoneal fluid   OT comments  Pt now with NGT and not feeling well. Sat at [facility] for UB skin care and UE exercises. Declined OOB to chair this visit and apologetic. Reassured pt she had a lot going on and her participation today was good. Will continue to follow.  Follow Up Recommendations  Home health OT    Equipment Recommendations  Tub/shower seat    Recommendations for Other Services      Precautions / Restrictions Precautions Precautions: Fall Precaution Comments: NGT       Mobility Bed Mobility Overal bed mobility: Needs Assistance Bed Mobility: Supine to Sit;Sit to Supine     Supine to sit: Min assist Sit to supine: Mod assist   General bed mobility comments: HOB up, min assist to raise trunk by pulling up on therapist's hand, assist for LEs back into bed  Transfers                 General transfer comment: pt declined OOB to chair    Balance Overall balance assessment: No apparent balance deficits (not formally assessed)                                         ADL either performed or assessed with clinical judgement   ADL Overall ADL's : Needs assistance/impaired Eating/Feeding: Minimal assistance;Bed level Eating/Feeding Details (indicate cue type and reason): ice chips with spoon     Upper Body Bathing: Moderate assistance;Sitting Upper Body Bathing Details (indicate cue type and reason): washed pt's  back and applied lotion                                 Vision       Perception     Praxis      Cognition Arousal/Alertness: Awake/alert Behavior During Therapy: WFL for tasks assessed/performed Overall Cognitive Status: Within Functional Limits for tasks assessed                                 General Comments: pt with discomfort of NGT        Exercises Exercises: General Upper Extremity General Exercises - Upper Extremity Shoulder Flexion: AROM;Both;10 reps;Seated Elbow Flexion: AROM;Both;10 reps;Seated Elbow Extension: AROM;Both;10 reps;Seated Wrist Flexion: AROM;Both;10 reps;Seated Wrist Extension: AROM;Both;10 reps;Seated Digit Composite Flexion: AROM;Both;10 reps;Seated Composite Extension: AROM;Both;10 reps;Seated   Shoulder Instructions       General Comments      Pertinent Vitals/ Pain       Pain Assessment: No/denies pain  Home Living                                          Prior Functioning/Environment  Frequency  Min 2X/week        Progress Toward Goals  OT Goals(current goals can now be found in the care plan section)  Progress towards OT goals: Not progressing toward goals - comment (not feeling well today)  Acute Rehab OT Goals Patient Stated Goal: To go home soon OT Goal Formulation: With patient Time For Goal Achievement: 09/01/20 Potential to Achieve Goals: Good  Plan Discharge plan remains appropriate    Co-evaluation                 AM-PAC OT "6 Clicks" Daily Activity     Outcome Measure   Help from another person eating meals?: A Little Help from another person taking care of personal grooming?: A Little Help from another person toileting, which includes using toliet, bedpan, or urinal?: A Little Help from another person bathing (including washing, rinsing, drying)?: A Lot Help from another person to put on and taking off regular upper body clothing?: A  Little Help from another person to put on and taking off regular lower body clothing?: A Lot 6 Click Score: 16    End of Session    OT Visit Diagnosis: Muscle weakness (generalized) (M62.81)   Activity Tolerance Patient limited by fatigue   Patient Left in bed;with call bell/phone within reach   Nurse Communication          Time: 6803-2122 OT Time Calculation (min): 18 min  Charges: OT General Charges $OT Visit: 1 Visit OT Treatments $Therapeutic Activity: 8-22 mins  Brandi Bowen, OTR/L Acute Rehabilitation Services Pager: [phone] Office: [phone]   Brandi Bowen 08/20/2020, 1:30 PM

## 2020-08-20 NOTE — Assessment & Plan Note (Signed)
-   see DVT as well - continue Lovenox for now

## 2020-08-20 NOTE — Progress Notes (Signed)
PROGRESS NOTE    Brandi Bowen   ONG:[id number]  DOB: [birth year]  DOA: 08/11/2020     8  PCP: [name], PA-C  CC: abdominal distension  Hospital Course: Brandi Bowen is an 79 yo CF with PMH uterine mass (u/k etiology currently), hypothyroidism, hyperlipidemia who presented to the hospital with abdominal distention.  She also has a known history of breast cancer in remission. She has recently been followed by GYN oncology for postmenopausal bleeding and underwent imaging studies which have shown large fibroids with ascites. Imaging studies performed in the ER were concerning for possible peritoneal carcinomatosis and possible underlying endometrial malignancy.  She also underwent duplex which revealed left peroneal DVT and was started on anticoagulation, transitioned to Xarelto and back to heparin drip on 08/16/2020. She previously underwent a paracentesis on 08/12/2020 which removed 4.8 L of peritoneal fluid however cytology was nondiagnostic.  She was evaluated by GYN oncology during hospitalization and recommended for tissue biopsy for diagnostic purposes.   She was able to be scheduled for percutaneous biopsy on 08/18/2020 with IR.  She underwent biopsy of omental fat pad performed under CT guidance with 5 core samples obtained and sent to pathology.  She also underwent repeat paracentesis on 08/18/2020 removing 2.1 L of fluid.  This did give her some further relief as her abdomen had become further distended during hospitalization.  On 08/19/16 she had a large amount of bilious vomit, ~500 cc and an NGT was placed. This was accompanied with abscence of flatus and BM also. A CT chest/abd/pelvis was done on 10/17 which showed: Bilateral PE, large left pleural effusion, no pulm nodules, moderate ascites, extensive omental nodularity, peritoneal carcinomatosis, calcified fibroid uterus.   Family discussion was held on the phone extensively on 10/17 with patient's brother, his wife,  and the patient's 2 sons. Options are still being considered however in light of everything present and her decreasing quality of life as is without having even started treatment (if able) plus the dynamics outpatient of patient getting to chemo appts etc (lives alone), family is considering hospice route, pending further discussions and talking with gyn onc as well.    Interval History:  NGT placed yesterday; no further N/V. She does feel better from this standpoint but having no flatus or BM at this time. She is not interested in trying to take tube out or pursue food.  Family meeting held via phone (patient's brother, sister-in-law, and patient's 2 sons Brandi Bowen and Brandi Bowen). We talked about CT findings from today and options of possible treatment (if offered/candidate) pending biopsy results vs pursuing hospice route. Everyone will think and discuss today but agree that hospice at this point is not unreasonable.  They will talk with gyn onc further on Monday too in terms of prognosis, treatment options, etc.  I asked the patient what she would want and she tells me "I don't want to be a burden on my family". And she seems understanding that her prognosis is poor overall and that treatment might make her feel worse without providing any significant curative benefit with extensive disease.   More conversations to follow on Monday.   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  - for now continue NGT, pain, and nausea control - NPO but ice chips okay, etc for comfort   Fibroids -Large fibroid noted on CT abdomen/pelvis.  There is high suspicion for underlying malignancy - s/p CT guided biopsy with IR on 10/15; follow up results -Being followed by GYN oncology during hospitalization, appreciate assistance  Pleural effusion - large left pleural effusion, considered malignant until proven otherwise at this point - patient is not SOB or hypoxic; for now will leave alone unless symptomatic then pursue therapeutic relief (possibly may help with diagnosis pending omental bx results) - monitor for now  Pulmonary emboli ([place]) - see DVT as well - continue Lovenox for now  [facility]) - patient developed severe N/V on 10/16, approx 500 cc bilious vomitus; obtained CT on 10/17, no obvious obstruction/compression making this more likely ileus from underlying significant carcinomatosis - continue NGT - see peritoneal carcinomatosis  Acute deep vein thrombosis (DVT) of left lower extremity ([place]) - left peroneal vein - likely provoked in setting of underlying suspected malignancy -Has been on heparin drip during hospitalization in anticipation of biopsy on 08/18/2020. Now has B/L PE, she is NPO due to severe N/V and ileus with NGT in place and cannot go back to Xarelto (treatment may also be discontinued soon if pursuing hospice as well), but for now will start on Lovenox 1 mg/kg BID  Ascites -Paracentesis performed on 08/12/2020 that was nondiagnostic -Patient was developing worsening abdominal distention/fullness since her last paracentesis.  She underwent repeat paracentesis on 08/18/2020, removing 2.1 L fluid. -Follow-up cytology from paracentesis today, 08/18/2020 as last cytology  was nondiagnostic - ascites already re-accumulating on repeat CT on 10/17  Physical deconditioning - patient lives alone and does not seem to have 24/7 support. Children live out of town; she does have friends closeby for help per son - patient has declined further in past 64 hours; no longer feel she is safe for home alone  - at this point she will either need SNF, residential hospice, or living with family with hospice on board (will especially need support if pursues chemo)   Antimicrobials: None  DVT prophylaxis: Heparin drip>>Lovenox on 10/17 Code Status: Full Family Communication: Brother bedside; talked to everyone else on phone, see above Disposition Plan: Status is: Inpatient  Remains inpatient appropriate because:Ongoing diagnostic testing needed not appropriate for outpatient work up, Unsafe d/c plan, IV treatments appropriate due to intensity of illness or inability to take PO and Inpatient level of care appropriate due to severity of illness   Dispo: The patient is from: Home              Anticipated d/c is to: pending further discussions Hospice possibly               Anticipated d/c date is: > 3 days              Patient currently is not medically stable to d/c.  Objective: Blood pressure 111/75, pulse 79, temperature 98.2 F (36.8 C), temperature source Oral, resp. rate 15, height 5\' 5"  (1.651 m), weight 86.2 kg, SpO2 91 %.  Examination: General appearance: weak but pleasant; no  distress; NGT in place Head: Normocephalic, without obvious abnormality, atraumatic Eyes: EOMI Lungs: dull left sided breath sounds; no wheezing Heart: regular rate and rhythm and S1, S2 normal Abdomen: Less distended after paracentesis, irregular firmness now able to be appreciated throughout almost entire abdomen consistent with underlying enlarged uterus/tumor.  Bowel sounds absent Extremities: 1-2+ LE edema worse in LLE Skin: mobility and turgor normal Neurologic: Grossly  normal  Consultants:   GYN onc  IR  Procedures:   n/a  Data Reviewed: I have personally reviewed following labs and imaging studies Results for orders placed or performed during the hospital encounter of 08/11/20 (from the past 24 hour(s))  Heparin level (unfractionated)     Status: Abnormal   Collection Time: 08/19/20  6:45 PM  Result Value Ref Range   Heparin Unfractionated <0.10 (L) 0.30 - 0.70 IU/mL  APTT     Status: None   Collection Time: 08/19/20  6:45 PM  Result Value Ref Range   aPTT 34 24 - 36 seconds  Basic metabolic panel     Status: Abnormal   Collection Time: 08/20/20  6:27 AM  Result Value Ref Range   Sodium 138 135 - 145 mmol/L   Potassium 3.6 3.5 - 5.1 mmol/L   Chloride 92 (L) 98 - 111 mmol/L   CO2 31 22 - 32 mmol/L   Glucose, Bld 107 (H) 70 - 99 mg/dL   BUN 34 (H) 8 - 23 mg/dL   Creatinine, Ser 1.40 (H) 0.44 - 1.00 mg/dL   Calcium 8.6 (L) 8.9 - 10.3 mg/dL   GFR, Estimated 35 (L) >60 mL/min   Anion gap 15 5 - 15  Magnesium     Status: Abnormal   Collection Time: 08/20/20  6:27 AM  Result Value Ref Range   Magnesium 2.5 (H) 1.7 - 2.4 mg/dL  CBC with Differential/Platelet     Status: Abnormal   Collection Time: 08/20/20  6:27 AM  Result Value Ref Range   WBC 14.9 (H) 4.0 - 10.5 K/uL   RBC 4.45 3.87 - 5.11 MIL/uL   Hemoglobin 11.9 (L) 12.0 - 15.0 g/dL   HCT 37.7 36 - 46 %   MCV 84.7 80.0 - 100.0 fL   MCH 26.7 26.0 - 34.0 pg   MCHC 31.6 30.0 - 36.0 g/dL   RDW 14.2 11.5 - 15.5 %   Platelets 452 (H) 150 - 400 K/uL   nRBC 0.0 0.0 - 0.2 %   Neutrophils Relative % 86 %   Neutro Abs 12.8 (H) 1.7 - 7.7 K/uL   Lymphocytes Relative 6 %   Lymphs Abs 0.8 0.7 - 4.0 K/uL   Monocytes Relative 7 %   Monocytes Absolute 1.1 (H) 0.1 - 1.0 K/uL   Eosinophils Relative 0 %   Eosinophils Absolute 0.0 0.0 - 0.5 K/uL   Basophils Relative 0 %   Basophils Absolute 0.0 0.0 - 0.1 K/uL   Immature Granulocytes 1 %   Abs Immature Granulocytes 0.14 (H) 0.00 - 0.07 K/uL   Heparin level (unfractionated)     Status: Abnormal   Collection Time: 08/20/20  6:27 AM  Result Value Ref Range   Heparin Unfractionated <0.10 (L) 0.30 - 0.70 IU/mL    Recent Results (from the past 240 hour(s))  Respiratory Panel by RT PCR (Flu A&B, Covid) - Nasopharyngeal Swab     Status: None   Collection Time: 08/11/20  6:40 PM   Specimen: Nasopharyngeal Swab  Result Value Ref Range Status   SARS Coronavirus  2 by RT PCR NEGATIVE NEGATIVE Final    Comment: (NOTE) SARS-CoV-2 target nucleic acids are NOT DETECTED.  The SARS-CoV-2 RNA is generally detectable in upper respiratoy specimens during the acute phase of infection. The lowest concentration of SARS-CoV-2 viral copies this assay can detect is 131 copies/mL. A negative result does not preclude SARS-Cov-2 infection and should not be used as the sole basis for treatment or other patient management decisions. A negative result may occur with  improper specimen collection/handling, submission of specimen other than nasopharyngeal swab, presence of viral mutation(s) within the areas targeted by this assay, and inadequate number of viral copies (<131 copies/mL). A negative result must be combined with clinical observations, patient history, and epidemiological information. The expected result is Negative.  Fact Sheet for Patients:  PinkCheek.be  Fact Sheet for Healthcare Providers:  GravelBags.it  This test is no t yet approved or cleared by the [place] FDA and  has been authorized for detection and/or diagnosis of SARS-CoV-2 by FDA under an Emergency Use Authorization (EUA). This EUA will remain  in effect (meaning this test can be used) for the duration of the COVID-19 declaration under Section 564(b)(1) of the Act, 21 U.S.C. section 360bbb-3(b)(1), unless the authorization is terminated or revoked sooner.     Influenza A by PCR NEGATIVE NEGATIVE Final    Influenza B by PCR NEGATIVE NEGATIVE Final    Comment: (NOTE) The Xpert Xpress SARS-CoV-2/FLU/RSV assay is intended as an aid in  the diagnosis of influenza from Nasopharyngeal swab specimens and  should not be used as a sole basis for treatment. Nasal washings and  aspirates are unacceptable for Xpert Xpress SARS-CoV-2/FLU/RSV  testing.  Fact Sheet for Patients: PinkCheek.be  Fact Sheet for Healthcare Providers: GravelBags.it  This test is not yet approved or cleared by the [place] FDA and  has been authorized for detection and/or diagnosis of SARS-CoV-2 by  FDA under an Emergency Use Authorization (EUA). This EUA will remain  in effect (meaning this test can be used) for the duration of the  Covid-19 declaration under Section 564(b)(1) of the Act, 21  U.S.C. section 360bbb-3(b)(1), unless the authorization is  terminated or revoked. Performed at [facility], [place] [address], [place], [place] [address]   Gram stain     Status: None   Collection Time: 08/12/20  2:02 PM   Specimen: Peritoneal Washings  Result Value Ref Range Status   Specimen Description   Final    PERITONEAL Performed at [place] [address] [place], [place] [address]    Special Requests   Final    NONE Performed at [facility], [place] [address], [place], [place] [address]    Gram Stain   Final    RARE WBC PRESENT, PREDOMINANTLY MONONUCLEAR NO ORGANISMS SEEN Performed at [facility], [place] [address], [place], [place] [address]    Report Status 08/14/2020 FINAL  Final     Radiology Studies: CT CHEST W CONTRAST  Result Date: 08/20/2020 CLINICAL DATA:  Pleural effusion, malignancy suspected, uterine mass, bowel obstruction suspected, recent omental biopsy EXAM: CT CHEST, ABDOMEN, AND PELVIS WITH CONTRAST TECHNIQUE: Multidetector CT imaging of the chest, abdomen and pelvis  was performed following the standard protocol during bolus administration of intravenous contrast. CONTRAST:  36mL OMNIPAQUE IOHEXOL 300 MG/ML  SOLN COMPARISON:  CT abdomen pelvis, 08/14/2020 FINDINGS: CT CHEST FINDINGS Cardiovascular: Aortic atherosclerosis. Normal heart size. No pericardial effusion. There is incidental note of bilateral  pulmonary embolism, with generally nonocclusive lobar to segmental embolus present bilaterally. Mediastinum/Nodes: No enlarged mediastinal, hilar, or axillary lymph nodes. Small hiatal hernia. Thyroid gland, trachea, and esophagus demonstrate no significant findings. Lungs/Pleura: There are moderate to large left, small right pleural effusions and associated atelectasis or consolidation. There is no pulmonary or pleural nodularity appreciated. Musculoskeletal: No chest wall mass or suspicious bone lesions identified. Status post right mastectomy. CT ABDOMEN PELVIS FINDINGS Hepatobiliary: No solid liver abnormality is seen. No gallstones, gallbladder wall thickening, or biliary dilatation. Pancreas: Calcifications of the pancreatic parenchyma. No pancreatic ductal dilatation or surrounding inflammatory changes. Spleen: Normal in size without significant abnormality. Adrenals/Urinary Tract: Adrenal glands are unremarkable. Small nonobstructive calculus of the inferior pole of the right kidney. No hydronephrosis. Bladder is unremarkable. Stomach/Bowel: Stomach is within normal limits. Appendix appears normal. No evidence of bowel wall thickening, distention, or inflammatory changes. Vascular/Lymphatic: Aortic atherosclerosis. No enlarged abdominal or pelvic lymph nodes. Reproductive: Redemonstrated bulky, partially calcified fibroid uterus Other: No abdominal wall hernia or abnormality. Redemonstrated moderate volume ascites throughout the abdomen pelvis with extensive peritoneal thickening and omental nodularity throughout the abdomen, similar to prior examination. Musculoskeletal:  No acute or significant osseous findings. IMPRESSION: 1. There is incidental note of bilateral pulmonary embolism, with generally nonocclusive lobar to segmental embolus present bilaterally. RV LV ratio and main pulmonary artery caliber are normal. 2. There are moderate to large left, small right pleural effusions and associated atelectasis or consolidation. 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 3. Redemonstrated moderate volume ascites throughout the abdomen pelvis with extensive peritoneal thickening and omental nodularity throughout the abdomen, similar to prior examination, consistent with peritoneal carcinomatosis. As on prior examinations, there is no obvious primary malignancy in the abdomen or pelvis and primary differential considerations include ovarian, endometrial, and recurrent breast malignancy. 4. Redemonstrated bulky, partially calcified fibroid uterus. 5. No evidence of bowel obstruction. 6. Other chronic and incidental findings as above. 7. Aortic Atherosclerosis (ICD10-I70.0). These results were called by telephone at the time of interpretation on 08/20/2020 at 9:53 am to provider Raider Valbuena Sabino Gasser , who verbally acknowledged these results. Electronically Signed   By: Eddie Candle M.D.   On: 08/20/2020 10:01   CT ABDOMEN PELVIS W CONTRAST  Result Date: 08/20/2020 CLINICAL DATA:  Pleural effusion, malignancy suspected, uterine mass, bowel obstruction suspected, recent omental biopsy EXAM: CT CHEST, ABDOMEN, AND PELVIS WITH CONTRAST TECHNIQUE: Multidetector CT imaging of the chest, abdomen and pelvis was performed following the standard protocol during bolus administration of intravenous contrast. CONTRAST:  34mL OMNIPAQUE IOHEXOL 300 MG/ML  SOLN COMPARISON:  CT abdomen pelvis, 08/14/2020 FINDINGS: CT CHEST FINDINGS  Cardiovascular: Aortic atherosclerosis. Normal heart size. No pericardial effusion. There is incidental note of bilateral pulmonary embolism, with generally nonocclusive lobar to segmental embolus present bilaterally. Mediastinum/Nodes: No enlarged mediastinal, hilar, or axillary lymph nodes. Small hiatal hernia. Thyroid gland, trachea, and esophagus demonstrate no significant findings. Lungs/Pleura: There are moderate to large left, small right pleural effusions and associated atelectasis or consolidation. There is no pulmonary or pleural nodularity appreciated. Musculoskeletal: No chest wall mass or suspicious bone lesions identified. Status post right mastectomy. CT ABDOMEN PELVIS FINDINGS Hepatobiliary: No solid liver abnormality is seen. No gallstones, gallbladder wall thickening, or biliary dilatation. Pancreas: Calcifications of the pancreatic parenchyma. No pancreatic ductal dilatation or surrounding inflammatory changes. Spleen: Normal in size without significant abnormality. Adrenals/Urinary Tract: Adrenal  glands are unremarkable. Small nonobstructive calculus of the inferior pole of the right kidney. No hydronephrosis. Bladder is unremarkable. Stomach/Bowel: Stomach is within normal limits. Appendix appears normal. No evidence of bowel wall thickening, distention, or inflammatory changes. Vascular/Lymphatic: Aortic atherosclerosis. No enlarged abdominal or pelvic lymph nodes. Reproductive: Redemonstrated bulky, partially calcified fibroid uterus Other: No abdominal wall hernia or abnormality. Redemonstrated moderate volume ascites throughout the abdomen pelvis with extensive peritoneal thickening and omental nodularity throughout the abdomen, similar to prior examination. Musculoskeletal: No acute or significant osseous findings. IMPRESSION: 1. There is incidental note of bilateral pulmonary embolism, with generally nonocclusive lobar to segmental embolus present bilaterally. RV LV ratio and main  pulmonary artery caliber are normal. 2. There are moderate to large left, small right pleural effusions and associated atelectasis or consolidation. 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 3. Redemonstrated moderate volume ascites throughout the abdomen pelvis with extensive peritoneal thickening and omental nodularity throughout the abdomen, similar to prior examination, consistent with peritoneal carcinomatosis. As on prior examinations, there is no obvious primary malignancy in the abdomen or pelvis and primary differential considerations include ovarian, endometrial, and recurrent breast malignancy. 4. Redemonstrated bulky, partially calcified fibroid uterus. 5. No evidence of bowel obstruction. 6. Other chronic and incidental findings as above. 7. Aortic Atherosclerosis (ICD10-I70.0). These results were called by telephone at the time of interpretation on 08/20/2020 at 9:53 am to provider Macenzie Burford Sabino Gasser , who verbally acknowledged these results. Electronically Signed   By: Eddie Candle M.D.   On: 08/20/2020 10:01   CT ABDOMEN PELVIS W CONTRAST  Final Result    CT CHEST W CONTRAST  Final Result    US Paracentesis  Final Result    CT BIOPSY  Final Result    DG Abd 1 View  Final Result    CT ABDOMEN PELVIS WO CONTRAST  Final Result    US Paracentesis  Final Result    VAS [place] LOWER EXTREMITY VENOUS (DVT) (ONLY [facility] & [facility] 7a-7p)  Final Result    DG Chest 2 View  Final Result    CT Abdomen Pelvis W Contrast  Final Result      Scheduled Meds: . enoxaparin (LOVENOX) injection  85 mg Subcutaneous BID   PRN Meds: bisacodyl, morphine injection, ondansetron (ZOFRAN) IV, phenol, promethazine Continuous Infusions: . sodium chloride 75 mL/hr at 08/20/20 0304      LOS: 8 days  Time spent: Greater than 50% of the 35 minute  visit was spent in counseling/coordination of care for the patient as laid out in the A&P.   Dwyane Dee, MD [facility] 08/20/2020, 11:00 AM

## 2020-08-21 ENCOUNTER — Encounter: Payer: Self-pay | Admitting: Oncology

## 2020-08-21 DIAGNOSIS — C786 Secondary malignant neoplasm of retroperitoneum and peritoneum: Secondary | ICD-10-CM | POA: Diagnosis not present

## 2020-08-21 DIAGNOSIS — K567 Ileus, unspecified: Secondary | ICD-10-CM | POA: Diagnosis not present

## 2020-08-21 DIAGNOSIS — R5381 Other malaise: Secondary | ICD-10-CM | POA: Diagnosis not present

## 2020-08-21 DIAGNOSIS — I82452 Acute embolism and thrombosis of left peroneal vein: Secondary | ICD-10-CM | POA: Diagnosis not present

## 2020-08-21 LAB — CBC WITH DIFFERENTIAL/PLATELET
Abs Immature Granulocytes: 0.1 10*3/uL — ABNORMAL HIGH (ref 0.00–0.07)
Basophils Absolute: 0 10*3/uL (ref 0.0–0.1)
Basophils Relative: 0 %
Eosinophils Absolute: 0 10*3/uL (ref 0.0–0.5)
Eosinophils Relative: 0 %
HCT: 37.3 % (ref 36.0–46.0)
Hemoglobin: 11.8 g/dL — ABNORMAL LOW (ref 12.0–15.0)
Immature Granulocytes: 1 %
Lymphocytes Relative: 4 %
Lymphs Abs: 0.7 10*3/uL (ref 0.7–4.0)
MCH: 26.5 pg (ref 26.0–34.0)
MCHC: 31.6 g/dL (ref 30.0–36.0)
MCV: 83.8 fL (ref 80.0–100.0)
Monocytes Absolute: 1.2 10*3/uL — ABNORMAL HIGH (ref 0.1–1.0)
Monocytes Relative: 7 %
Neutro Abs: 15.3 10*3/uL — ABNORMAL HIGH (ref 1.7–7.7)
Neutrophils Relative %: 88 %
Platelets: 445 10*3/uL — ABNORMAL HIGH (ref 150–400)
RBC: 4.45 MIL/uL (ref 3.87–5.11)
RDW: 14.5 % (ref 11.5–15.5)
WBC: 17.3 10*3/uL — ABNORMAL HIGH (ref 4.0–10.5)
nRBC: 0 % (ref 0.0–0.2)

## 2020-08-21 LAB — BASIC METABOLIC PANEL
Anion gap: 19 — ABNORMAL HIGH (ref 5–15)
BUN: 33 mg/dL — ABNORMAL HIGH (ref 8–23)
CO2: 30 mmol/L (ref 22–32)
Calcium: 8 mg/dL — ABNORMAL LOW (ref 8.9–10.3)
Chloride: 95 mmol/L — ABNORMAL LOW (ref 98–111)
Creatinine, Ser: 1.32 mg/dL — ABNORMAL HIGH (ref 0.44–1.00)
GFR, Estimated: 38 mL/min — ABNORMAL LOW (ref >60)
Glucose, Bld: 107 mg/dL — ABNORMAL HIGH (ref 70–99)
Potassium: 3.4 mmol/L — ABNORMAL LOW (ref 3.5–5.1)
Sodium: 144 mmol/L (ref 135–145)

## 2020-08-21 LAB — MAGNESIUM: Magnesium: 2.6 mg/dL — ABNORMAL HIGH (ref 1.7–2.4)

## 2020-08-21 NOTE — Progress Notes (Signed)
PT Cancellation Note  Patient Details Name: KAMBRIE EDDLEMAN MRN: [id number] DOB: 1941/09/16   Cancelled Treatment:    Reason Eval/Treat Not Completed: Medical issues which prohibited therapy, Note that Dc plan is unsure, depending on biopsy results. Will check back another day.    Claretha Cooper 08/21/2020, 1:29 PM  [place] Pager [phone] Office [phone]

## 2020-08-21 NOTE — Progress Notes (Signed)
Awaiting biopsy results to confirm diagnosis of disseminated malignancy and treatment options. When this result is available, if confirms advanced gynecologic cancer, I will discuss options for either chemotherapy or hospice with the patient and her son. In the meantime, agree with supportive care with NGT and IVF's for tumor ileus.  Thereasa Solo, MD Cell: [phone]

## 2020-08-21 NOTE — Plan of Care (Signed)
Pt VS WNL.  No report of nausea at this time.   Problem: Consults Goal: Venous Thromboembolism Patient Education Description: See Patient Education Module for education specifics. Outcome: Progressing   Problem: Education: Goal: Knowledge of General Education information will improve Description: Including pain rating scale, medication(s)/side effects and non-pharmacologic comfort measures Outcome: Progressing   Problem: Clinical Measurements: Goal: Ability to maintain clinical measurements within normal limits will improve Outcome: Progressing Goal: Will remain free from infection Outcome: Progressing Goal: Diagnostic test results will improve Outcome: Progressing Goal: Respiratory complications will improve Outcome: Progressing Goal: Cardiovascular complication will be avoided Outcome: Progressing   Problem: Coping: Goal: Level of anxiety will decrease Outcome: Progressing   Problem: Elimination: Goal: Will not experience complications related to bowel motility Outcome: Progressing Goal: Will not experience complications related to urinary retention Outcome: Progressing   Problem: Safety: Goal: Ability to remain free from injury will improve Outcome: Progressing   Problem: Skin Integrity: Goal: Risk for impaired skin integrity will decrease Outcome: Progressing   Problem: Health Behavior/Discharge Planning: Goal: Ability to manage health-related needs will improve Outcome: Not Progressing   Problem: Activity: Goal: Risk for activity intolerance will decrease Outcome: Not Progressing   Problem: Nutrition: Goal: Adequate nutrition will be maintained Outcome: Not Progressing   Problem: Pain Managment: Goal: General experience of comfort will improve Outcome: Not Progressing

## 2020-08-21 NOTE — Progress Notes (Signed)
PROGRESS NOTE    Brandi Bowen   WPY:[id number]  DOB: 12/04/1940  DOA: 08/11/2020     9  PCP: Brandi Ginger, PA-C  CC: abdominal distension  Hospital Course: Brandi Bowen is an 79 yo CF with PMH uterine mass (u/k etiology currently), hypothyroidism, hyperlipidemia who presented to the hospital with abdominal distention.  She also has a known history of breast cancer in remission. She has recently been followed by GYN oncology for postmenopausal bleeding and underwent imaging studies which have shown large fibroids with ascites. Imaging studies performed in the ER were concerning for possible peritoneal carcinomatosis and possible underlying endometrial malignancy.  She also underwent duplex which revealed left peroneal DVT and was started on anticoagulation, transitioned to Xarelto and back to heparin drip on 08/16/2020. She previously underwent a paracentesis on 08/12/2020 which removed 4.8 L of peritoneal fluid however cytology was nondiagnostic.  She was evaluated by GYN oncology during hospitalization and recommended for tissue biopsy for diagnostic purposes.   She was able to be scheduled for percutaneous biopsy on 08/18/2020 with IR.  She underwent biopsy of omental fat pad performed under CT guidance with 5 core samples obtained and sent to pathology.  She also underwent repeat paracentesis on 08/18/2020 removing 2.1 L of fluid.  This did give her some further relief as her abdomen had become further distended during hospitalization.  On 08/19/16 she had a large amount of bilious vomit, ~500 cc and an NGT was placed. This was accompanied with abscence of flatus and BM also. A CT chest/abd/pelvis was done on 10/17 which showed: Bilateral PE, large left pleural effusion, no pulm nodules, moderate ascites, extensive omental nodularity, peritoneal carcinomatosis, calcified fibroid uterus.   Family discussion was held on the phone extensively on 10/17 with patient's brother, his wife,  and the patient's 2 sons. Options are still being considered however in light of everything present and her decreasing quality of life as is without having even started treatment (if able) plus the dynamics outpatient of patient getting to chemo appts etc (lives alone), family is considering hospice route, pending further discussions and talking with gyn onc as well.    Interval History:  No events overnight.  Brother bedside again this morning.  Family has been discussing things since yesterday and overall discussion seems to be leaning towards hospice at this point. They are still awaiting pathology results in order to help guide discussions after also talking with GYN oncology.  Appears that today they were told tentative biopsy results showed carcinoma with unclear primary source at this time but hopeful for further information tomorrow. Brought up CODE STATUS discussion with patient this morning.  She is still unsure of what she wants but will continue to discuss with family today she says.  For now remains full code. She is otherwise comfortable with NG tube in place and has had no further nausea or vomiting.  Ice chips are adequate at this point and she does want to drink a few sips of water occasionally.  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 and now contributing to probable ileus 2/2 N/V on 10/16; NGT now in place and draining significant amount (but patient has relief with tube in) - given decline consistently the past 2 days, she is no longer safe or stable  for discharging home; especially without further discussions regarding treatment vs hospice route - family discussion held on 10/17; see hosp course above; but overall leaning  towards hospice pending further gyn onc discussion after further pathology available  - we have discussed code status, but at this time she has not made any decisions but this is an ongoing discussion with patient and family; for now remains full code - for now continue NGT, pain, and nausea control - NPO but ice chips okay, etc for comfort   Fibroids -Large fibroid noted on CT abdomen/pelvis.  There is high suspicion for underlying malignancy - s/p CT guided biopsy with IR on 10/15; follow up results -Being followed by GYN oncology during hospitalization, appreciate assistance  Pleural effusion - large left pleural effusion, considered malignant until proven otherwise at this point - patient is not SOB or hypoxic; for now will leave alone unless symptomatic then pursue therapeutic relief (possibly may help with diagnosis pending omental bx results) - monitor for now  Pulmonary emboli ([place]) - see DVT as well - continue Lovenox for now  Ileus [facility]) - patient developed severe N/V on 10/16, approx 500 cc bilious vomitus; obtained CT on 10/17, no obvious obstruction/compression making this more likely ileus from underlying significant carcinomatosis - continue NGT - see peritoneal carcinomatosis  Acute deep vein thrombosis (DVT) of left lower extremity ([place]) - left peroneal vein - likely provoked in setting of underlying suspected malignancy -Has been on heparin drip during hospitalization in anticipation of biopsy on 08/18/2020. Now has B/L PE, she is NPO due to severe N/V and ileus with NGT in place and cannot go back to Xarelto (treatment may also be discontinued soon if pursuing hospice as well), but for now will start on Lovenox 1 mg/kg BID  Ascites -Paracentesis performed on 08/12/2020 that was nondiagnostic -Patient was developing worsening abdominal distention/fullness since her last paracentesis.  She underwent repeat paracentesis on 08/18/2020, removing 2.1 L fluid. -Follow-up  cytology from paracentesis today, 08/18/2020 as last cytology was nondiagnostic - ascites already re-accumulating on repeat CT on 10/17  Physical deconditioning - patient lives alone and does not seem to have 24/7 support. Children live out of town; she does have friends closeby for help per son - patient has declined further in past 2 hours; no longer feel she is safe for home alone  - at this point she will either need SNF, residential hospice, or living with family with hospice on board (will especially need support if pursues chemo)   Antimicrobials: None  DVT prophylaxis: Heparin drip>>Lovenox on 10/17 Code Status: Full Family Communication: Brother bedside; talked to everyone else on phone, see above Disposition Plan: Status is: Inpatient  Remains inpatient appropriate because:Ongoing diagnostic testing needed not appropriate for outpatient work up, Unsafe d/c plan, IV treatments appropriate due to intensity of illness or inability to take PO and Inpatient level of care appropriate due to severity of illness   Dispo: The patient is from: Home              Anticipated d/c is to: pending further discussions Hospice possibly               Anticipated d/c date is: > 3 days              Patient currently is not medically stable to d/c.  Objective: Blood pressure 111/74, pulse 84, temperature 97.7 F (36.5 C), temperature source Oral, resp. rate 16, height 5\' 5"  (1.651 m), weight 86.2 kg,  SpO2 91 %.  Examination: General appearance: weak but pleasant; no distress; NGT in place Head: Normocephalic, without obvious abnormality, atraumatic Eyes: EOMI Lungs: dull left sided breath sounds; no wheezing Heart: regular rate and rhythm and S1, S2 normal Abdomen: Irregular firmness now able to be appreciated throughout almost entire abdomen consistent with underlying enlarged uterus/tumor.  Bowel sounds absent Extremities: 1+ LE edema worse in LLE; overall has improved Skin: mobility and  turgor normal Neurologic: Grossly normal  Consultants:   GYN onc  IR  Procedures:   n/a  Data Reviewed: I have personally reviewed following labs and imaging studies Results for orders placed or performed during the hospital encounter of 08/11/20 (from the past 24 hour(s))  Basic metabolic panel     Status: Abnormal   Collection Time: 08/21/20  5:33 AM  Result Value Ref Range   Sodium 144 135 - 145 mmol/L   Potassium 3.4 (L) 3.5 - 5.1 mmol/L   Chloride 95 (L) 98 - 111 mmol/L   CO2 30 22 - 32 mmol/L   Glucose, Bld 107 (H) 70 - 99 mg/dL   BUN 33 (H) 8 - 23 mg/dL   Creatinine, Ser 1.32 (H) 0.44 - 1.00 mg/dL   Calcium 8.0 (L) 8.9 - 10.3 mg/dL   GFR, Estimated 38 (L) >60 mL/min   Anion gap 19 (H) 5 - 15  Magnesium     Status: Abnormal   Collection Time: 08/21/20  5:33 AM  Result Value Ref Range   Magnesium 2.6 (H) 1.7 - 2.4 mg/dL  CBC with Differential/Platelet     Status: Abnormal   Collection Time: 08/21/20  5:33 AM  Result Value Ref Range   WBC 17.3 (H) 4.0 - 10.5 K/uL   RBC 4.45 3.87 - 5.11 MIL/uL   Hemoglobin 11.8 (L) 12.0 - 15.0 g/dL   HCT 37.3 36 - 46 %   MCV 83.8 80.0 - 100.0 fL   MCH 26.5 26.0 - 34.0 pg   MCHC 31.6 30.0 - 36.0 g/dL   RDW 14.5 11.5 - 15.5 %   Platelets 445 (H) 150 - 400 K/uL   nRBC 0.0 0.0 - 0.2 %   Neutrophils Relative % 88 %   Neutro Abs 15.3 (H) 1.7 - 7.7 K/uL   Lymphocytes Relative 4 %   Lymphs Abs 0.7 0.7 - 4.0 K/uL   Monocytes Relative 7 %   Monocytes Absolute 1.2 (H) 0.1 - 1.0 K/uL   Eosinophils Relative 0 %   Eosinophils Absolute 0.0 0.0 - 0.5 K/uL   Basophils Relative 0 %   Basophils Absolute 0.0 0.0 - 0.1 K/uL   Immature Granulocytes 1 %   Abs Immature Granulocytes 0.10 (H) 0.00 - 0.07 K/uL    Recent Results (from the past 240 hour(s))  Respiratory Panel by RT PCR (Flu A&B, Covid) - Nasopharyngeal Swab     Status: None   Collection Time: 08/11/20  6:40 PM   Specimen: Nasopharyngeal Swab  Result Value Ref Range Status   SARS  Coronavirus 2 by RT PCR NEGATIVE NEGATIVE Final    Comment: (NOTE) SARS-CoV-2 target nucleic acids are NOT DETECTED.  The SARS-CoV-2 RNA is generally detectable in upper respiratoy specimens during the acute phase of infection. The lowest concentration of SARS-CoV-2 viral copies this assay can detect is 131 copies/mL. A negative result does not preclude SARS-Cov-2 infection and should not be used as the sole basis for treatment or other patient management decisions. A negative result may occur with  improper specimen  collection/handling, submission of specimen other than nasopharyngeal swab, presence of viral mutation(s) within the areas targeted by this assay, and inadequate number of viral copies (<131 copies/mL). A negative result must be combined with clinical observations, patient history, and epidemiological information. The expected result is Negative.  Fact Sheet for Patients:  PinkCheek.be  Fact Sheet for Healthcare Providers:  GravelBags.it  This test is no t yet approved or cleared by the [place] FDA and  has been authorized for detection and/or diagnosis of SARS-CoV-2 by FDA under an Emergency Use Authorization (EUA). This EUA will remain  in effect (meaning this test can be used) for the duration of the COVID-19 declaration under Section 564(b)(1) of the Act, 21 U.S.C. section 360bbb-3(b)(1), unless the authorization is terminated or revoked sooner.     Influenza A by PCR NEGATIVE NEGATIVE Final   Influenza B by PCR NEGATIVE NEGATIVE Final    Comment: (NOTE) The Xpert Xpress SARS-CoV-2/FLU/RSV assay is intended as an aid in  the diagnosis of influenza from Nasopharyngeal swab specimens and  should not be used as a sole basis for treatment. Nasal washings and  aspirates are unacceptable for Xpert Xpress SARS-CoV-2/FLU/RSV  testing.  Fact Sheet for  Patients: PinkCheek.be  Fact Sheet for Healthcare Providers: GravelBags.it  This test is not yet approved or cleared by the [place] FDA and  has been authorized for detection and/or diagnosis of SARS-CoV-2 by  FDA under an Emergency Use Authorization (EUA). This EUA will remain  in effect (meaning this test can be used) for the duration of the  Covid-19 declaration under Section 564(b)(1) of the Act, 21  U.S.C. section 360bbb-3(b)(1), unless the authorization is  terminated or revoked. Performed at [facility], [place] [address], [place], [place] [address]   Gram stain     Status: None   Collection Time: 08/12/20  2:02 PM   Specimen: Peritoneal Washings  Result Value Ref Range Status   Specimen Description   Final    PERITONEAL Performed at [place] [address] [place], [place] [address]    Special Requests   Final    NONE Performed at [facility], [place] [address], [place], [place] [address]    Gram Stain   Final    RARE WBC PRESENT, PREDOMINANTLY MONONUCLEAR NO ORGANISMS SEEN Performed at [facility], [place] [address], [place], [place] [address]    Report Status 08/14/2020 FINAL  Final     Radiology Studies: CT CHEST W CONTRAST  Result Date: 08/20/2020 CLINICAL DATA:  Pleural effusion, malignancy suspected, uterine mass, bowel obstruction suspected, recent omental biopsy EXAM: CT CHEST, ABDOMEN, AND PELVIS WITH CONTRAST TECHNIQUE: Multidetector CT imaging of the chest, abdomen and pelvis was performed following the standard protocol during bolus administration of intravenous contrast. CONTRAST:  22mL OMNIPAQUE IOHEXOL 300 MG/ML  SOLN COMPARISON:  CT abdomen pelvis, 08/14/2020 FINDINGS: CT CHEST FINDINGS Cardiovascular: Aortic atherosclerosis. Normal heart size. No pericardial effusion. There is incidental note of bilateral pulmonary embolism,  with generally nonocclusive lobar to segmental embolus present bilaterally. Mediastinum/Nodes: No enlarged mediastinal, hilar, or axillary lymph nodes. Small hiatal hernia. Thyroid gland, trachea, and esophagus demonstrate no significant findings. Lungs/Pleura: There are moderate to large left, small right pleural effusions and associated atelectasis or consolidation. There is no pulmonary or pleural nodularity appreciated. Musculoskeletal: No chest wall mass or suspicious bone lesions identified. Status post right mastectomy. CT ABDOMEN PELVIS FINDINGS Hepatobiliary: No solid liver abnormality is seen. No gallstones,  gallbladder wall thickening, or biliary dilatation. Pancreas: Calcifications of the pancreatic parenchyma. No pancreatic ductal dilatation or surrounding inflammatory changes. Spleen: Normal in size without significant abnormality. Adrenals/Urinary Tract: Adrenal glands are unremarkable. Small nonobstructive calculus of the inferior pole of the right kidney. No hydronephrosis. Bladder is unremarkable. Stomach/Bowel: Stomach is within normal limits. Appendix appears normal. No evidence of bowel wall thickening, distention, or inflammatory changes. Vascular/Lymphatic: Aortic atherosclerosis. No enlarged abdominal or pelvic lymph nodes. Reproductive: Redemonstrated bulky, partially calcified fibroid uterus Other: No abdominal wall hernia or abnormality. Redemonstrated moderate volume ascites throughout the abdomen pelvis with extensive peritoneal thickening and omental nodularity throughout the abdomen, similar to prior examination. Musculoskeletal: No acute or significant osseous findings. IMPRESSION: 1. There is incidental note of bilateral pulmonary embolism, with generally nonocclusive lobar to segmental embolus present bilaterally. RV LV ratio and main pulmonary artery caliber are normal. 2. There are moderate to large left, small right pleural effusions and associated atelectasis or consolidation.  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 3. Redemonstrated moderate volume ascites throughout the abdomen pelvis with extensive peritoneal thickening and omental nodularity throughout the abdomen, similar to prior examination, consistent with peritoneal carcinomatosis. As on prior examinations, there is no obvious primary malignancy in the abdomen or pelvis and primary differential considerations include ovarian, endometrial, and recurrent breast malignancy. 4. Redemonstrated bulky, partially calcified fibroid uterus. 5. No evidence of bowel obstruction. 6. Other chronic and incidental findings as above. 7. Aortic Atherosclerosis (ICD10-I70.0). These results were called by telephone at the time of interpretation on 08/20/2020 at 9:53 am to provider Domenique Southers Sabino Gasser , who verbally acknowledged these results. Electronically Signed   By: Eddie Candle M.D.   On: 08/20/2020 10:01   CT ABDOMEN PELVIS W CONTRAST  Result Date: 08/20/2020 CLINICAL DATA:  Pleural effusion, malignancy suspected, uterine mass, bowel obstruction suspected, recent omental biopsy EXAM: CT CHEST, ABDOMEN, AND PELVIS WITH CONTRAST TECHNIQUE: Multidetector CT imaging of the chest, abdomen and pelvis was performed following the standard protocol during bolus administration of intravenous contrast. CONTRAST:  75mL OMNIPAQUE IOHEXOL 300 MG/ML  SOLN COMPARISON:  CT abdomen pelvis, 08/14/2020 FINDINGS: CT CHEST FINDINGS Cardiovascular: Aortic atherosclerosis. Normal heart size. No pericardial effusion. There is incidental note of bilateral pulmonary embolism, with generally nonocclusive lobar to segmental embolus present bilaterally. Mediastinum/Nodes: No enlarged mediastinal, hilar, or axillary lymph nodes. Small hiatal hernia. Thyroid gland, trachea, and esophagus demonstrate no  significant findings. Lungs/Pleura: There are moderate to large left, small right pleural effusions and associated atelectasis or consolidation. There is no pulmonary or pleural nodularity appreciated. Musculoskeletal: No chest wall mass or suspicious bone lesions identified. Status post right mastectomy. CT ABDOMEN PELVIS FINDINGS Hepatobiliary: No solid liver abnormality is seen. No gallstones, gallbladder wall thickening, or biliary dilatation. Pancreas: Calcifications of the pancreatic parenchyma. No pancreatic ductal dilatation or surrounding inflammatory changes. Spleen: Normal in size without significant abnormality. Adrenals/Urinary Tract: Adrenal glands are unremarkable. Small nonobstructive calculus of the inferior pole of the right kidney. No hydronephrosis. Bladder is unremarkable. Stomach/Bowel: Stomach is within normal limits. Appendix appears normal. No evidence of bowel wall thickening, distention, or inflammatory changes. Vascular/Lymphatic: Aortic atherosclerosis. No enlarged abdominal or pelvic lymph nodes. Reproductive: Redemonstrated bulky, partially calcified fibroid uterus Other: No abdominal wall hernia or abnormality. Redemonstrated moderate volume ascites throughout the abdomen pelvis with extensive peritoneal thickening and omental nodularity throughout the abdomen, similar to prior  examination. Musculoskeletal: No acute or significant osseous findings. IMPRESSION: 1. There is incidental note of bilateral pulmonary embolism, with generally nonocclusive lobar to segmental embolus present bilaterally. RV LV ratio and main pulmonary artery caliber are normal. 2. There are moderate to large left, small right pleural effusions and associated atelectasis or consolidation. 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 3. Redemonstrated  moderate volume ascites throughout the abdomen pelvis with extensive peritoneal thickening and omental nodularity throughout the abdomen, similar to prior examination, consistent with peritoneal carcinomatosis. As on prior examinations, there is no obvious primary malignancy in the abdomen or pelvis and primary differential considerations include ovarian, endometrial, and recurrent breast malignancy. 4. Redemonstrated bulky, partially calcified fibroid uterus. 5. No evidence of bowel obstruction. 6. Other chronic and incidental findings as above. 7. Aortic Atherosclerosis (ICD10-I70.0). These results were called by telephone at the time of interpretation on 08/20/2020 at 9:53 am to provider [place] Leisinger Sabino Gasser , who verbally acknowledged these results. Electronically Signed   By: Eddie Candle M.D.   On: 08/20/2020 10:01   CT ABDOMEN PELVIS W CONTRAST  Final Result    CT CHEST W CONTRAST  Final Result    US Paracentesis  Final Result    CT BIOPSY  Final Result    DG Abd 1 View  Final Result    CT ABDOMEN PELVIS WO CONTRAST  Final Result    US Paracentesis  Final Result    VAS [place] LOWER EXTREMITY VENOUS (DVT) (ONLY [facility] & [facility] 7a-7p)  Final Result    DG Chest 2 View  Final Result    CT Abdomen Pelvis W Contrast  Final Result      Scheduled Meds: . enoxaparin (LOVENOX) injection  85 mg Subcutaneous BID   PRN Meds: bisacodyl, morphine injection, ondansetron (ZOFRAN) IV, phenol, promethazine Continuous Infusions: . sodium chloride 75 mL/hr at 08/21/20 0549      LOS: 9 days  Time spent: Greater than 50% of the 35 minute visit was spent in counseling/coordination of care for the patient as laid out in the A&P.   Dwyane Dee, MD [facility] 08/21/2020, 1:00 PM

## 2020-08-21 NOTE — Care Management Important Message (Signed)
Important Message  Patient Details IM Letter given to the Patient Name: SUSANNE BAUMGARNER MRN: [id number] Date of Birth: January 21, 1941   Medicare Important Message Given:  Yes     Kerin Salen 08/21/2020, 9:54 AM

## 2020-08-21 NOTE — Progress Notes (Signed)
Brandi Bowen and her family member that her CT biopsy from Friday shows carcinoma per Dr. Saralyn Pilar.  We are waiting to see if it is from a breast or gyn source.  The final results should be out tomorrow afternoon.  She verbalized understanding.

## 2020-08-22 DIAGNOSIS — C801 Malignant (primary) neoplasm, unspecified: Secondary | ICD-10-CM

## 2020-08-22 DIAGNOSIS — N179 Acute kidney failure, unspecified: Secondary | ICD-10-CM

## 2020-08-22 DIAGNOSIS — Z853 Personal history of malignant neoplasm of breast: Secondary | ICD-10-CM

## 2020-08-22 DIAGNOSIS — C786 Secondary malignant neoplasm of retroperitoneum and peritoneum: Secondary | ICD-10-CM | POA: Diagnosis not present

## 2020-08-22 DIAGNOSIS — Z9011 Acquired absence of right breast and nipple: Secondary | ICD-10-CM

## 2020-08-22 DIAGNOSIS — C269 Malignant neoplasm of ill-defined sites within the digestive system: Secondary | ICD-10-CM

## 2020-08-22 DIAGNOSIS — R180 Malignant ascites: Secondary | ICD-10-CM | POA: Diagnosis not present

## 2020-08-22 DIAGNOSIS — J910 Malignant pleural effusion: Secondary | ICD-10-CM

## 2020-08-22 DIAGNOSIS — Z9221 Personal history of antineoplastic chemotherapy: Secondary | ICD-10-CM

## 2020-08-22 LAB — CYTOLOGY - NON PAP

## 2020-08-22 LAB — SURGICAL PATHOLOGY

## 2020-08-22 NOTE — Consult Note (Addendum)
[place]  [phone] Fax:([phone]   MEDICAL ONCOLOGY - INITIAL CONSULTATION  Referral MD: Dr. 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 Caprio is an 79 year old female with a past medical history of breast cancer.  She presented to the emergency room with abdominal distention which had progressed in the 2 weeks prior to admission.  She was being seen by GYN oncology for postmenopausal bleeding and had an MRI prior to admission which showed large fibroids with ascites.  In the emergency room, the patient had a CT of the abdomen pelvis which showed features concerning for peritoneal carcinomatosis with significant ascites and bilateral pleural effusions.  There was initially concern for endometrial malignancy.  Additionally, Doppler ultrasound of the lower extremity showed left peroneal DVT.  The patient had several ultrasound guided paracenteses this admission.  Initial cytology from 10/9/202 showed reactive mesothelial cells and numerous lymphoid cells and cytology from 08/18/2020 showed atypical cells present, suspicious for malignancy.  The patient underwent a biopsy of the omental fat on 08/18/2020 which showed adenocarcinoma and IHC staining most consistent with a GI primary, especially lower GI.  A CA-125 was obtained on 08/13/2020 and was elevated at 344, CA 27.29 was normal at 25.6, and CEA was normal at 1.1.  The patient is sitting up in the recliner chair.  Her son is at the bedside.  The patient tells me that she has had worsening abdominal distention and discomfort over the past few weeks.  Reports decrease in abdominal distention following paracentesis.  NG tube has been placed and she reports resolution of her nausea and vomiting since that time.  Her appetite has been fair prior to this admission and she has not noticed any obvious weight loss.  She states that she has not moved her bowels in about 10 days.  Denies headaches,  dizziness.  Denies chest pain shortness of breath.  She reports lower extremity edema.  No bleeding reported such as epistaxis, hematemesis, hemoptysis, hematuria, melena, hematochezia.  The patient reports that she has never had a colonoscopy.  Family history significant for a mother who died from colon cancer.  The patient is widowed and lives alone.  She has 2 children-1 lives in [place] and the other lives in [place].  She has a brother who lives in [place], [place].  Denies history of tobacco or alcohol use.  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   Past Medical History:  Diagnosis Date  . Breast cancer ([place])   . Cancer ([place])    rt breast ca  . Hydronephrosis   . Hyperlipidemia   . Hypothyroidism   . Uterine leiomyoma   . Vitamin D deficiency 02/25/2012  :  Past Surgical History:  Procedure Laterality Date  . MASTECTOMY    :  Current Facility-Administered Medications  Medication Dose Route Frequency Provider Last Rate Last Admin  . 0.9 %  sodium chloride infusion   Intravenous Continuous Dwyane Dee, MD 75 mL/hr at 08/22/20 0831 New Bag at 08/22/20 0831  . bisacodyl (DULCOLAX) suppository 10 mg  10 mg Rectal Daily PRN Antonieta Pert, MD   10 mg at 08/15/20 1515  . enoxaparin (LOVENOX) injection 85 mg  85 mg Subcutaneous BID Dwyane Dee, MD   85 mg at 08/22/20 0902  . morphine 2 MG/ML injection 2 mg  2 mg Intravenous Q2H PRN Dwyane Dee, MD   2 mg at 08/22/20 1100  . ondansetron (  ZOFRAN) injection 4 mg  4 mg Intravenous Q6H PRN Antonieta Pert, MD   4 mg at 08/19/20 0352  . phenol (CHLORASEPTIC) mouth spray 1 spray  1 spray Mouth/Throat PRN Dwyane Dee, MD      . promethazine (PHENERGAN) injection 12.5 mg  12.5 mg Intravenous Q6H PRN Dwyane Dee, MD   12.5 mg at 08/21/20 2209     Allergies  Allergen Reactions  . Shrimp [Shellfish Allergy]   :  Family History  Problem Relation Age of Onset  .  Cancer Mother        Colon cancer  . Cancer Brother        Prostate CA  :  Social History   Socioeconomic History  . Marital status: Widowed    Spouse name: Not on file  . Number of children: Not on file  . Years of education: Not on file  . Highest education level: Not on file  Occupational History  . Not on file  Tobacco Use  . Smoking status: Never Smoker  . Smokeless tobacco: Never Used  Vaping Use  . Vaping Use: Never used  Substance and Sexual Activity  . Alcohol use: No    Comment: Rare glass of wine  . Drug use: No  . Sexual activity: Yes    Birth control/protection: Post-menopausal  Other Topics Concern  . Not on file  Social History Narrative  . Not on file   Social Determinants of Health   Financial Resource Strain:   . Difficulty of Paying Living Expenses: Not on file  Food Insecurity:   . Worried About Charity fundraiser in the Last Year: Not on file  . Ran Out of Food in the Last Year: Not on file  Transportation Needs:   . Lack of Transportation (Medical): Not on file  . Lack of Transportation (Non-Medical): Not on file  Physical Activity:   . Days of Exercise per Week: Not on file  . Minutes of Exercise per Session: Not on file  Stress:   . Feeling of Stress : Not on file  Social Connections:   . Frequency of Communication with Friends and Family: Not on file  . Frequency of Social Gatherings with Friends and Family: Not on file  . Attends Religious Services: Not on file  . Active Member of Clubs or Organizations: Not on file  . Attends Archivist Meetings: Not on file  . Marital Status: Not on file  Intimate Partner Violence:   . Fear of Current or Ex-Partner: Not on file  . Emotionally Abused: Not on file  . Physically Abused: Not on file  . Sexually Abused: Not on file  :  Review of Systems: A comprehensive 14 point review of systems was negative except as noted in the HPI.  Exam: Patient Vitals for the past 24 hrs:  BP  Temp Temp src Pulse Resp SpO2  08/22/20 1405 130/84 98.5 F (36.9 C) Oral 98 17 90 %  08/22/20 0542 122/77 97.8 F (36.6 C) Oral 98 14 (!) 89 %  08/21/20 2047 119/75 98 F (36.7 C) Oral 98 16 90 %  08/21/20 1431 113/76 98.3 F (36.8 C) Oral 94 14 92 %    General: Chronically ill-appearing female, no distress Eyes:  no scleral icterus.   ENT: NG tube in place, no thrush or mucositis Lymphatics:  Negative cervical, supraclavicular, axillary, inguinal adenopathy Respiratory: Diminished breath sounds left base otherwise clear Cardiovascular:  Regular rate and rhythm, S1/S2, without murmur,  rub or gallop.  1+ lower extremity edema bilaterally GI: I was unable to appreciate any bowel sounds, abdomen firm, with firmness over uterus, right upper abdominal mass Musculoskeletal: Strength symmetrical in the upper and lower extremities Skin exam was without echymosis, petichae.   Neuro exam was nonfocal. Patient was alert and oriented.  Attention was good.   Language was appropriate.  Mood was normal without depression.  Speech was not pressured.  Thought content was not tangential. Breast: Right mastectomy no evidence for chest wall tumor recurrence.  Left breast without mass.   Lab Results  Component Value Date   WBC 17.3 (H) 08/21/2020   HGB 11.8 (L) 08/21/2020   HCT 37.3 08/21/2020   PLT 445 (H) 08/21/2020   GLUCOSE 107 (H) 08/21/2020   ALT 12 08/16/2020   AST 18 08/16/2020   NA 144 08/21/2020   K 3.4 (L) 08/21/2020   CL 95 (L) 08/21/2020   CREATININE 1.32 (H) 08/21/2020   BUN 33 (H) 08/21/2020   CO2 30 08/21/2020    CT ABDOMEN PELVIS WO CONTRAST  Result Date: 08/14/2020 CLINICAL DATA:  Abdominal distension, history of ascites and recent paracentesis EXAM: CT ABDOMEN AND PELVIS WITHOUT CONTRAST TECHNIQUE: Multidetector CT imaging of the abdomen and pelvis was performed following the standard protocol without IV contrast. COMPARISON:  08/11/2020 FINDINGS: Lower chest: Lung bases  demonstrate bilateral pleural effusions left considerably greater than right with lower lobe consolidation also left greater than right. Large hiatal hernia is seen. Hepatobiliary: No focal liver abnormality is seen. No gallstones, gallbladder wall thickening, or biliary dilatation. Pancreas: Scattered calcifications are noted consistent with chronic pancreatitis. Spleen: Normal in size without focal abnormality. Adrenals/Urinary Tract: Adrenal glands are unremarkable. Kidneys demonstrate nonobstructing stone in the lower pole of the right kidney stable from the previous exam. Left renal cyst is seen. No obstructive changes are noted. The bladder is decompressed. Stomach/Bowel: No obstructive or inflammatory changes of the colon are seen. The appendix is within normal limits. Small bowel and stomach are unremarkable aside from the known hiatal hernia. Vascular/Lymphatic: Aortic atherosclerosis. No enlarged abdominal or pelvic lymph nodes. Reproductive: Uterus is significantly enlarged with multiple calcified uterine fibroids similar to that seen on prior exam. Persistent thickening of the endometrium in the fundus is noted. This is stable from the prior exam. Adnexa are not well visualized. Other: Ascites is identified less than that seen on the prior exam consistent with the recent interval paracentesis. Persistent peritoneal thickening is noted suggestive of carcinomatosis. This is less well appreciated on the current exam given the lack of IV contrast. Musculoskeletal: Degenerative changes of lumbar spine are noted IMPRESSION: Bilateral pleural effusions left greater than right which have increased in the interval from the recent exam. Associated lower lobe consolidation is noted. Ascites, improved from the prior exam consistent with the recent paracentesis. 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 Nonobstructing right renal stone. Multiple calcified uterine fibroids and thickening  of the endometrium similar to that noted on the prior exam. Electronically Signed   By: Inez Catalina M.D.   On: 08/14/2020 20:15   DG Chest 2 View  Result Date: 08/11/2020 CLINICAL DATA:  Shortness of breath. Abdominal distention. Ascites. EXAM: CHEST - 2 VIEW COMPARISON:  None. FINDINGS: Moderate left pleural effusion and small right pleural effusion are seen. Atelectasis or consolidation is seen in the left lung base. Upper lung fields are clear. Heart size is within normal limits. IMPRESSION: Moderate left and small right pleural effusions. Left basilar  atelectasis versus consolidation. Electronically Signed   By: Marlaine Hind M.D.   On: 08/11/2020 17:17   DG Abd 1 View  Result Date: 08/16/2020 CLINICAL DATA:  Constipation. EXAM: ABDOMEN - 1 VIEW COMPARISON:  None. FINDINGS: The bowel gas pattern is normal. Mild amount of residual contrast and stool is seen throughout the colon. No radio-opaque calculi or other significant radiographic abnormality are seen. IMPRESSION: No evidence of bowel obstruction or ileus. Electronically Signed   By: Marijo Conception M.D.   On: 08/16/2020 08:17   CT CHEST W CONTRAST  Result Date: 08/20/2020 CLINICAL DATA:  Pleural effusion, malignancy suspected, uterine mass, bowel obstruction suspected, recent omental biopsy EXAM: CT CHEST, ABDOMEN, AND PELVIS WITH CONTRAST TECHNIQUE: Multidetector CT imaging of the chest, abdomen and pelvis was performed following the standard protocol during bolus administration of intravenous contrast. CONTRAST:  22m OMNIPAQUE IOHEXOL 300 MG/ML  SOLN COMPARISON:  CT abdomen pelvis, 08/14/2020 FINDINGS: CT CHEST FINDINGS Cardiovascular: Aortic atherosclerosis. Normal heart size. No pericardial effusion. There is incidental note of bilateral pulmonary embolism, with generally nonocclusive lobar to segmental embolus present bilaterally. Mediastinum/Nodes: No enlarged mediastinal, hilar, or axillary lymph nodes. Small hiatal hernia. Thyroid  gland, trachea, and esophagus demonstrate no significant findings. Lungs/Pleura: There are moderate to large left, small right pleural effusions and associated atelectasis or consolidation. There is no pulmonary or pleural nodularity appreciated. Musculoskeletal: No chest wall mass or suspicious bone lesions identified. Status post right mastectomy. CT ABDOMEN PELVIS FINDINGS Hepatobiliary: No solid liver abnormality is seen. No gallstones, gallbladder wall thickening, or biliary dilatation. Pancreas: Calcifications of the pancreatic parenchyma. No pancreatic ductal dilatation or surrounding inflammatory changes. Spleen: Normal in size without significant abnormality. Adrenals/Urinary Tract: Adrenal glands are unremarkable. Small nonobstructive calculus of the inferior pole of the right kidney. No hydronephrosis. Bladder is unremarkable. Stomach/Bowel: Stomach is within normal limits. Appendix appears normal. No evidence of bowel wall thickening, distention, or inflammatory changes. Vascular/Lymphatic: Aortic atherosclerosis. No enlarged abdominal or pelvic lymph nodes. Reproductive: Redemonstrated bulky, partially calcified fibroid uterus Other: No abdominal wall hernia or abnormality. Redemonstrated moderate volume ascites throughout the abdomen pelvis with extensive peritoneal thickening and omental nodularity throughout the abdomen, similar to prior examination. Musculoskeletal: No acute or significant osseous findings. IMPRESSION: 1. There is incidental note of bilateral pulmonary embolism, with generally nonocclusive lobar to segmental embolus present bilaterally. RV LV ratio and main pulmonary artery caliber are normal. 2. There are moderate to large left, small right pleural effusions and associated atelectasis or consolidation. There is no pulmonary or pleural nodularity appreciated, although in general these effusions are highly suspicious for malignant effusions given history of breast cancer and presumed  peritoneal carcinomatosis. 3. Redemonstrated moderate volume ascites throughout the abdomen pelvis with extensive peritoneal thickening and omental nodularity throughout the abdomen, similar to prior examination, consistent with peritoneal carcinomatosis. As on prior examinations, there is no obvious primary malignancy in the abdomen or pelvis and primary differential considerations include ovarian, endometrial, and recurrent breast malignancy. 4. Redemonstrated bulky, partially calcified fibroid uterus. 5. No evidence of bowel obstruction. 6. Other chronic and incidental findings as above. 7. Aortic Atherosclerosis (ICD10-I70.0). These results were called by telephone at the time of interpretation on 08/20/2020 at 9:53 am to provider DAVID GSabino Gasser, who verbally acknowledged these results. Electronically Signed   By: AEddie CandleM.D.   On: 08/20/2020 10:01   MR PELVIS WO CONTRAST  Result Date: 08/05/2020 CLINICAL DATA:  Right lower quadrant pain, uterine leiomyoma EXAM: MRI PELVIS WITHOUT  CONTRAST TECHNIQUE: Multiplanar multisequence MR imaging of the pelvis was performed. No intravenous contrast was administered. COMPARISON:  Pelvic ultrasound, 04/27/2020 FINDINGS: Examination is generally somewhat limited by motion artifact throughout. Urinary Tract:  No abnormality visualized. Bowel:  Unremarkable visualized pelvic bowel loops. Vascular/Lymphatic: No pathologically enlarged lymph nodes. No significant vascular abnormality seen. Reproductive: There are numerous bulky uterine fibroids, most notably a very large subserosal or pedunculated fibroid of the posterior body measuring approximately 16.7 x 9.2 x 9.7 cm (series 4, image 17). Overall dimensions of the uterus are at least 17.7 x 14.7 x 13.2 cm. The ovaries are not clearly distinguished due to motion artifact and lack of intravenous contrast. Other: There is large volume ascites throughout the lower abdomen and pelvis. Musculoskeletal: No suspicious bone  lesions identified. IMPRESSION: 1. Examination is generally limited by motion artifact throughout as well as lack of intravenous contrast. 2. Numerous bulky uterine fibroids, most notably a very large subserosal or pedunculated fibroid of the posterior body measuring approximately 16.7 x 9.2 x 9.7 cm. Overall dimensions of the uterus are at least 17.7 x 14.7 x 13.2 cm. 3. The ovaries are not clearly distinguished. 4. Large volume ascites throughout the lower abdomen and pelvis. Electronically Signed   By: Eddie Candle M.D.   On: 08/05/2020 15:20   CT ABDOMEN PELVIS W CONTRAST  Result Date: 08/20/2020 CLINICAL DATA:  Pleural effusion, malignancy suspected, uterine mass, bowel obstruction suspected, recent omental biopsy EXAM: CT CHEST, ABDOMEN, AND PELVIS WITH CONTRAST TECHNIQUE: Multidetector CT imaging of the chest, abdomen and pelvis was performed following the standard protocol during bolus administration of intravenous contrast. CONTRAST:  46m OMNIPAQUE IOHEXOL 300 MG/ML  SOLN COMPARISON:  CT abdomen pelvis, 08/14/2020 FINDINGS: CT CHEST FINDINGS Cardiovascular: Aortic atherosclerosis. Normal heart size. No pericardial effusion. There is incidental note of bilateral pulmonary embolism, with generally nonocclusive lobar to segmental embolus present bilaterally. Mediastinum/Nodes: No enlarged mediastinal, hilar, or axillary lymph nodes. Small hiatal hernia. Thyroid gland, trachea, and esophagus demonstrate no significant findings. Lungs/Pleura: There are moderate to large left, small right pleural effusions and associated atelectasis or consolidation. There is no pulmonary or pleural nodularity appreciated. Musculoskeletal: No chest wall mass or suspicious bone lesions identified. Status post right mastectomy. CT ABDOMEN PELVIS FINDINGS Hepatobiliary: No solid liver abnormality is seen. No gallstones, gallbladder wall thickening, or biliary dilatation. Pancreas: Calcifications of the pancreatic parenchyma.  No pancreatic ductal dilatation or surrounding inflammatory changes. Spleen: Normal in size without significant abnormality. Adrenals/Urinary Tract: Adrenal glands are unremarkable. Small nonobstructive calculus of the inferior pole of the right kidney. No hydronephrosis. Bladder is unremarkable. Stomach/Bowel: Stomach is within normal limits. Appendix appears normal. No evidence of bowel wall thickening, distention, or inflammatory changes. Vascular/Lymphatic: Aortic atherosclerosis. No enlarged abdominal or pelvic lymph nodes. Reproductive: Redemonstrated bulky, partially calcified fibroid uterus Other: No abdominal wall hernia or abnormality. Redemonstrated moderate volume ascites throughout the abdomen pelvis with extensive peritoneal thickening and omental nodularity throughout the abdomen, similar to prior examination. Musculoskeletal: No acute or significant osseous findings. IMPRESSION: 1. There is incidental note of bilateral pulmonary embolism, with generally nonocclusive lobar to segmental embolus present bilaterally. RV LV ratio and main pulmonary artery caliber are normal. 2. There are moderate to large left, small right pleural effusions and associated atelectasis or consolidation. 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 3. Redemonstrated moderate volume ascites throughout the abdomen pelvis  with extensive peritoneal thickening and omental nodularity throughout the abdomen, similar to prior examination, consistent with peritoneal carcinomatosis. As on prior examinations, there is no obvious primary malignancy in the abdomen or pelvis and primary differential considerations include ovarian, endometrial, and recurrent breast malignancy. 4. Redemonstrated bulky, partially calcified fibroid uterus. 5. No evidence of bowel obstruction. 6.  Other chronic and incidental findings as above. 7. Aortic Atherosclerosis (ICD10-I70.0). These results were called by telephone at the time of interpretation on 08/20/2020 at 9:53 am to provider DAVID Sabino Gasser , who verbally acknowledged these results. Electronically Signed   By: Eddie Candle M.D.   On: 08/20/2020 10:01   CT Abdomen Pelvis W Contrast  Result Date: 08/11/2020 CLINICAL DATA:  Abdominal distension. Ascites. Fibroids. Personal history of breast carcinoma. EXAM: CT ABDOMEN AND PELVIS WITH CONTRAST TECHNIQUE: Multidetector CT imaging of the abdomen and pelvis was performed using the standard protocol following bolus administration of intravenous contrast. CONTRAST:  120m OMNIPAQUE IOHEXOL 300 MG/ML  SOLN COMPARISON:  Pelvis MRI on 08/04/2020 FINDINGS: Lower Chest: Moderate left and small right pleural effusions, with left greater than right lower lobe atelectasis. Previous right mastectomy. Hepatobiliary: No hepatic masses identified. Gallbladder is unremarkable. No evidence of biliary ductal dilatation. Pancreas:  No mass or inflammatory changes. Spleen: Within normal limits in size and appearance. Adrenals/Urinary Tract: No masses identified. 5 cm left renal cyst noted. No evidence of ureteral calculi or hydronephrosis. Stomach/Bowel: Moderate hiatal hernia is seen. No evidence of obstruction, inflammatory process or abnormal fluid collections. Vascular/Lymphatic: No pathologically enlarged lymph nodes. No abdominal aortic aneurysm. Aortic atherosclerotic calcification noted. Reproductive: Markedly enlarged uterus is seen due to multiple partially calcified uterine fibroids. The largest fibroid is subserosal in location in the posterior uterine corpus measuring 15.6 cm. Thickening of the endometrium is seen in the fundal region uterus which measures up to 15 mm. Endometrial carcinoma cannot be excluded in a postmenopausal female. No separate adnexal masses are identified. Other: A large amount of  ascites is seen. Diffuse mesenteric soft tissue stranding is seen as well as omental caking. Mild peritoneal thickening and enhancement is also seen in the lower abdomen pelvis. These findings are highly suspicious for peritoneal carcinomatosis. Musculoskeletal:  No suspicious bone lesions identified. IMPRESSION: Large amount of ascites, and diffuse mesenteric soft tissue stranding and omental caking, highly suspicious for peritoneal carcinomatosis. Markedly enlarged uterus due to multiple fibroids measuring up to 15 cm. Thickening of the endometrium in the fundal region up to 15 mm. Endometrial carcinoma cannot be excluded in a postmenopausal female. Moderate hiatal hernia. Moderate left and small right pleural effusions, with bilateral lower lobe atelectasis. Electronically Signed   By: JMarlaine HindM.D.   On: 08/11/2020 17:14   [place]orea[place]acentesis  Result Date: 08/18/2020 INDICATION: Patient with history of pelvic mass, carcinomatosis, recurrent ascites; request received for diagnostic and therapeutic paracentesis. EXAM: ULTRASOUND GUIDED DIAGNOSTIC AND THERAPEUTIC PARACENTESIS MEDICATIONS: 1% lidocaine to skin and subcutaneous tissue COMPLICATIONS: None immediate. PROCEDURE: Informed written consent was obtained from the patient after a discussion of the risks, benefits and alternatives to treatment. A timeout was performed prior to the initiation of the procedure. Initial ultrasound scanning demonstrates a small to moderate amount of ascites within the left mid to lower abdominal quadrant. The left mid to lower abdomen was prepped and draped in the usual sterile fashion. 1% lidocaine was used for local anesthesia. Following this, a 19 gauge, 10-cm, Yueh catheter was introduced. An ultrasound image was saved for documentation purposes. The paracentesis was  performed. The catheter was removed and a dressing was applied. The patient tolerated the procedure well without immediate post procedural complication.  FINDINGS: A total of approximately 2.1 liters of amber fluid was removed. Samples were sent to the laboratory as requested by the clinical team. IMPRESSION: Successful ultrasound-guided diagnostic and therapeutic paracentesis yielding 2.1 liters of peritoneal fluid. Read by: Rowe Robert, PA-C Electronically Signed   By: Aletta Edouard M.D.   On: 08/18/2020 10:08   US Paracentesis  Result Date: 08/12/2020 INDICATION: Patient with a history of breast cancer now with suspected endometrial malignancy presents today with ascites. Interventional radiology asked to perform a therapeutic and diagnostic paracentesis. EXAM: ULTRASOUND GUIDED PARACENTESIS MEDICATIONS: 1% lidocaine 10 mL COMPLICATIONS: None immediate PROCEDURE: Informed written consent was obtained from the patient after a discussion of the risks, benefits and alternatives to treatment. A timeout was performed prior to the initiation of the procedure. Initial ultrasound scanning demonstrates a large amount of ascites within the right lower abdominal quadrant. The right lower abdomen was prepped and draped in the usual sterile fashion. 1% lidocaine was used for local anesthesia. Following this, a 19 gauge, 7-cm, Yueh catheter was introduced. An ultrasound image was saved for documentation purposes. The paracentesis was performed. The catheter was removed and a dressing was applied. The patient tolerated the procedure well without immediate post procedural complication. FINDINGS: A total of approximately 4.8 L of light pink fluid was removed. Samples were sent to the laboratory as requested by the clinical team. IMPRESSION: Successful ultrasound-guided paracentesis yielding 4.8 liters of peritoneal fluid. Read by: Soyla Dryer, NP Electronically Signed   By: Lucrezia Europe M.D.   On: 08/12/2020 13:50   CT BIOPSY  Result Date: 08/18/2020 CLINICAL DATA:  Large pelvic mass, ascites and evidence of carcinomatosis by imaging. Prior paracentesis did not yield  malignant cells by analysis of ascites and additional CT-guided omental biopsy is now performed. EXAM: CT GUIDED CORE BIOPSY OF OMENTUM ANESTHESIA/SEDATION: 2.0 mg IV Versed Total Moderate Sedation Time:  10 minutes. The patient's level of consciousness and physiologic status were continuously monitored during the procedure by Radiology nursing. The patient also received 4 mg IV Zofran for nausea. PROCEDURE: The procedure risks, benefits, and alternatives were explained to the patient. Questions regarding the procedure were encouraged and answered. The patient understands and consents to the procedure. A time-out was performed prior to initiating the procedure. Prior to the biopsy, a separate paracentesis procedure was performed to remove ascites. This will be dictated separately. Localizing CT of the abdomen and pelvis was performed in a supine position. The anterior abdominal wall was prepped with chlorhexidine in a sterile fashion, and a sterile drape was applied covering the operative field. A sterile gown and sterile gloves were used for the procedure. Local anesthesia was provided with 1% Lidocaine. Under CT guidance, a 17 gauge trocar needle was advanced into anterior abdominal omental fat from a midline approach towards the left anterior peritoneal cavity. After confirming needle tip position, a total of 5 separate coaxial 18 gauge core biopsy samples were obtained of omentum and placed in formalin. Additional CT was performed. COMPLICATIONS: None FINDINGS: An area of anterior omental fat demonstrating infiltrative stranding was chosen inferior to the transverse colon. Solid tissue was obtained. IMPRESSION: CT-guided core biopsy performed of omental fat in the midline and left anterior abdomen just inferior to the transverse colon. Electronically Signed   By: Aletta Edouard M.D.   On: 08/18/2020 09:43   VAS [place] LOWER EXTREMITY VENOUS (  DVT) (ONLY [facility] & [facility] 7a-7p)  Result Date: 08/11/2020  Lower Venous  DVTStudy Indications: Swelling.  Comparison Study: no prior Performing Technologist: Abram Sander RVS  Examination Guidelines: A complete evaluation includes B-mode imaging, spectral Doppler, color Doppler, and power Doppler as needed of all accessible portions of each vessel. Bilateral testing is considered an integral part of a complete examination. Limited examinations for reoccurring indications may be performed as noted. The reflux portion of the exam is performed with the patient in reverse Trendelenburg.  +-----+---------------+---------+-----------+----------+--------------+ RIGHTCompressibilityPhasicitySpontaneityPropertiesThrombus Aging +-----+---------------+---------+-----------+----------+--------------+ CFV  Full           Yes      Yes                                 +-----+---------------+---------+-----------+----------+--------------+   +---------+---------------+---------+-----------+----------+--------------+ LEFT     CompressibilityPhasicitySpontaneityPropertiesThrombus Aging +---------+---------------+---------+-----------+----------+--------------+ CFV      Full           Yes      Yes                                 +---------+---------------+---------+-----------+----------+--------------+ SFJ      Full                                                        +---------+---------------+---------+-----------+----------+--------------+ FV Prox  Full                                                        +---------+---------------+---------+-----------+----------+--------------+ FV Mid   Full                                                        +---------+---------------+---------+-----------+----------+--------------+ FV DistalFull                                                        +---------+---------------+---------+-----------+----------+--------------+ PFV      Full                                                         +---------+---------------+---------+-----------+----------+--------------+ POP      Full           Yes      Yes                                 +---------+---------------+---------+-----------+----------+--------------+ PTV      Full                                                        +---------+---------------+---------+-----------+----------+--------------+  PERO     None           No       No                   Acute          +---------+---------------+---------+-----------+----------+--------------+     Summary: RIGHT: - No evidence of common femoral vein obstruction.  LEFT: - Findings consistent with acute deep vein thrombosis involving the left peroneal veins. - No cystic structure found in the popliteal fossa.  *See table(s) above for measurements and observations. Electronically signed by Servando Snare MD on 08/11/2020 at 5:30:50 PM.    Final      CT ABDOMEN PELVIS WO CONTRAST  Result Date: 08/14/2020 CLINICAL DATA:  Abdominal distension, history of ascites and recent paracentesis EXAM: CT ABDOMEN AND PELVIS WITHOUT CONTRAST TECHNIQUE: Multidetector CT imaging of the abdomen and pelvis was performed following the standard protocol without IV contrast. COMPARISON:  08/11/2020 FINDINGS: Lower chest: Lung bases demonstrate bilateral pleural effusions left considerably greater than right with lower lobe consolidation also left greater than right. Large hiatal hernia is seen. Hepatobiliary: No focal liver abnormality is seen. No gallstones, gallbladder wall thickening, or biliary dilatation. Pancreas: Scattered calcifications are noted consistent with chronic pancreatitis. Spleen: Normal in size without focal abnormality. Adrenals/Urinary Tract: Adrenal glands are unremarkable. Kidneys demonstrate nonobstructing stone in the lower pole of the right kidney stable from the previous exam. Left renal cyst is seen. No obstructive changes are noted. The bladder is decompressed.  Stomach/Bowel: No obstructive or inflammatory changes of the colon are seen. The appendix is within normal limits. Small bowel and stomach are unremarkable aside from the known hiatal hernia. Vascular/Lymphatic: Aortic atherosclerosis. No enlarged abdominal or pelvic lymph nodes. Reproductive: Uterus is significantly enlarged with multiple calcified uterine fibroids similar to that seen on prior exam. Persistent thickening of the endometrium in the fundus is noted. This is stable from the prior exam. Adnexa are not well visualized. Other: Ascites is identified less than that seen on the prior exam consistent with the recent interval paracentesis. Persistent peritoneal thickening is noted suggestive of carcinomatosis. This is less well appreciated on the current exam given the lack of IV contrast. Musculoskeletal: Degenerative changes of lumbar spine are noted IMPRESSION: Bilateral pleural effusions left greater than right which have increased in the interval from the recent exam. Associated lower lobe consolidation is noted. Ascites, improved from the prior exam consistent with the recent paracentesis. 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 Nonobstructing right renal stone. Multiple calcified uterine fibroids and thickening of the endometrium similar to that noted on the prior exam. Electronically Signed   By: Inez Catalina M.D.   On: 08/14/2020 20:15   DG Chest 2 View  Result Date: 08/11/2020 CLINICAL DATA:  Shortness of breath. Abdominal distention. Ascites. EXAM: CHEST - 2 VIEW COMPARISON:  None. FINDINGS: Moderate left pleural effusion and small right pleural effusion are seen. Atelectasis or consolidation is seen in the left lung base. Upper lung fields are clear. Heart size is within normal limits. IMPRESSION: Moderate left and small right pleural effusions. Left basilar atelectasis versus consolidation. Electronically Signed   By: Marlaine Hind M.D.   On: 08/11/2020 17:17    DG Abd 1 View  Result Date: 08/16/2020 CLINICAL DATA:  Constipation. EXAM: ABDOMEN - 1 VIEW COMPARISON:  None. FINDINGS: The bowel gas pattern is normal. Mild amount of residual contrast and stool is seen throughout the colon. No radio-opaque  calculi or other significant radiographic abnormality are seen. IMPRESSION: No evidence of bowel obstruction or ileus. Electronically Signed   By: Marijo Conception M.D.   On: 08/16/2020 08:17   CT CHEST W CONTRAST  Result Date: 08/20/2020 CLINICAL DATA:  Pleural effusion, malignancy suspected, uterine mass, bowel obstruction suspected, recent omental biopsy EXAM: CT CHEST, ABDOMEN, AND PELVIS WITH CONTRAST TECHNIQUE: Multidetector CT imaging of the chest, abdomen and pelvis was performed following the standard protocol during bolus administration of intravenous contrast. CONTRAST:  52m OMNIPAQUE IOHEXOL 300 MG/ML  SOLN COMPARISON:  CT abdomen pelvis, 08/14/2020 FINDINGS: CT CHEST FINDINGS Cardiovascular: Aortic atherosclerosis. Normal heart size. No pericardial effusion. There is incidental note of bilateral pulmonary embolism, with generally nonocclusive lobar to segmental embolus present bilaterally. Mediastinum/Nodes: No enlarged mediastinal, hilar, or axillary lymph nodes. Small hiatal hernia. Thyroid gland, trachea, and esophagus demonstrate no significant findings. Lungs/Pleura: There are moderate to large left, small right pleural effusions and associated atelectasis or consolidation. There is no pulmonary or pleural nodularity appreciated. Musculoskeletal: No chest wall mass or suspicious bone lesions identified. Status post right mastectomy. CT ABDOMEN PELVIS FINDINGS Hepatobiliary: No solid liver abnormality is seen. No gallstones, gallbladder wall thickening, or biliary dilatation. Pancreas: Calcifications of the pancreatic parenchyma. No pancreatic ductal dilatation or surrounding inflammatory changes. Spleen: Normal in size without significant  abnormality. Adrenals/Urinary Tract: Adrenal glands are unremarkable. Small nonobstructive calculus of the inferior pole of the right kidney. No hydronephrosis. Bladder is unremarkable. Stomach/Bowel: Stomach is within normal limits. Appendix appears normal. No evidence of bowel wall thickening, distention, or inflammatory changes. Vascular/Lymphatic: Aortic atherosclerosis. No enlarged abdominal or pelvic lymph nodes. Reproductive: Redemonstrated bulky, partially calcified fibroid uterus Other: No abdominal wall hernia or abnormality. Redemonstrated moderate volume ascites throughout the abdomen pelvis with extensive peritoneal thickening and omental nodularity throughout the abdomen, similar to prior examination. Musculoskeletal: No acute or significant osseous findings. IMPRESSION: 1. There is incidental note of bilateral pulmonary embolism, with generally nonocclusive lobar to segmental embolus present bilaterally. RV LV ratio and main pulmonary artery caliber are normal. 2. There are moderate to large left, small right pleural effusions and associated atelectasis or consolidation. 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 3. Redemonstrated moderate volume ascites throughout the abdomen pelvis with extensive peritoneal thickening and omental nodularity throughout the abdomen, similar to prior examination, consistent with peritoneal carcinomatosis. As on prior examinations, there is no obvious primary malignancy in the abdomen or pelvis and primary differential considerations include ovarian, endometrial, and recurrent breast malignancy. 4. Redemonstrated bulky, partially calcified fibroid uterus. 5. No evidence of bowel obstruction. 6. Other chronic and incidental findings as above. 7. Aortic Atherosclerosis (ICD10-I70.0). These results were  called by telephone at the time of interpretation on 08/20/2020 at 9:53 am to provider DAVID GSabino Gasser, who verbally acknowledged these results. Electronically Signed   By: AEddie CandleM.D.   On: 08/20/2020 10:01   MR PELVIS WO CONTRAST  Result Date: 08/05/2020 CLINICAL DATA:  Right lower quadrant pain, uterine leiomyoma EXAM: MRI PELVIS WITHOUT CONTRAST TECHNIQUE: Multiplanar multisequence MR imaging of the pelvis was performed. No intravenous contrast was administered. COMPARISON:  Pelvic ultrasound, 04/27/2020 FINDINGS: Examination is generally somewhat limited by motion artifact throughout. Urinary Tract:  No abnormality visualized. Bowel:  Unremarkable visualized pelvic bowel loops. Vascular/Lymphatic: No pathologically enlarged lymph nodes. No significant vascular abnormality seen. Reproductive: There are numerous bulky  uterine fibroids, most notably a very large subserosal or pedunculated fibroid of the posterior body measuring approximately 16.7 x 9.2 x 9.7 cm (series 4, image 17). Overall dimensions of the uterus are at least 17.7 x 14.7 x 13.2 cm. The ovaries are not clearly distinguished due to motion artifact and lack of intravenous contrast. Other: There is large volume ascites throughout the lower abdomen and pelvis. Musculoskeletal: No suspicious bone lesions identified. IMPRESSION: 1. Examination is generally limited by motion artifact throughout as well as lack of intravenous contrast. 2. Numerous bulky uterine fibroids, most notably a very large subserosal or pedunculated fibroid of the posterior body measuring approximately 16.7 x 9.2 x 9.7 cm. Overall dimensions of the uterus are at least 17.7 x 14.7 x 13.2 cm. 3. The ovaries are not clearly distinguished. 4. Large volume ascites throughout the lower abdomen and pelvis. Electronically Signed   By: Eddie Candle M.D.   On: 08/05/2020 15:20   CT ABDOMEN PELVIS W CONTRAST  Result Date: 08/20/2020 CLINICAL DATA:  Pleural effusion, malignancy  suspected, uterine mass, bowel obstruction suspected, recent omental biopsy EXAM: CT CHEST, ABDOMEN, AND PELVIS WITH CONTRAST TECHNIQUE: Multidetector CT imaging of the chest, abdomen and pelvis was performed following the standard protocol during bolus administration of intravenous contrast. CONTRAST:  8m OMNIPAQUE IOHEXOL 300 MG/ML  SOLN COMPARISON:  CT abdomen pelvis, 08/14/2020 FINDINGS: CT CHEST FINDINGS Cardiovascular: Aortic atherosclerosis. Normal heart size. No pericardial effusion. There is incidental note of bilateral pulmonary embolism, with generally nonocclusive lobar to segmental embolus present bilaterally. Mediastinum/Nodes: No enlarged mediastinal, hilar, or axillary lymph nodes. Small hiatal hernia. Thyroid gland, trachea, and esophagus demonstrate no significant findings. Lungs/Pleura: There are moderate to large left, small right pleural effusions and associated atelectasis or consolidation. There is no pulmonary or pleural nodularity appreciated. Musculoskeletal: No chest wall mass or suspicious bone lesions identified. Status post right mastectomy. CT ABDOMEN PELVIS FINDINGS Hepatobiliary: No solid liver abnormality is seen. No gallstones, gallbladder wall thickening, or biliary dilatation. Pancreas: Calcifications of the pancreatic parenchyma. No pancreatic ductal dilatation or surrounding inflammatory changes. Spleen: Normal in size without significant abnormality. Adrenals/Urinary Tract: Adrenal glands are unremarkable. Small nonobstructive calculus of the inferior pole of the right kidney. No hydronephrosis. Bladder is unremarkable. Stomach/Bowel: Stomach is within normal limits. Appendix appears normal. No evidence of bowel wall thickening, distention, or inflammatory changes. Vascular/Lymphatic: Aortic atherosclerosis. No enlarged abdominal or pelvic lymph nodes. Reproductive: Redemonstrated bulky, partially calcified fibroid uterus Other: No abdominal wall hernia or abnormality.  Redemonstrated moderate volume ascites throughout the abdomen pelvis with extensive peritoneal thickening and omental nodularity throughout the abdomen, similar to prior examination. Musculoskeletal: No acute or significant osseous findings. IMPRESSION: 1. There is incidental note of bilateral pulmonary embolism, with generally nonocclusive lobar to segmental embolus present bilaterally. RV LV ratio and main pulmonary artery caliber are normal. 2. There are moderate to large left, small right pleural effusions and associated atelectasis or consolidation. 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 3. Redemonstrated moderate volume ascites throughout the abdomen pelvis with extensive peritoneal thickening and omental nodularity throughout the abdomen, similar to prior examination, consistent with peritoneal carcinomatosis. As on prior examinations, there is no obvious primary malignancy in the abdomen or pelvis and primary differential considerations include ovarian, endometrial, and recurrent breast malignancy. 4. Redemonstrated bulky, partially calcified fibroid uterus. 5. No evidence of bowel obstruction. 6. Other chronic  and incidental findings as above. 7. Aortic Atherosclerosis (ICD10-I70.0). These results were called by telephone at the time of interpretation on 08/20/2020 at 9:53 am to provider DAVID Sabino Gasser , who verbally acknowledged these results. Electronically Signed   By: Eddie Candle M.D.   On: 08/20/2020 10:01   CT Abdomen Pelvis W Contrast  Result Date: 08/11/2020 CLINICAL DATA:  Abdominal distension. Ascites. Fibroids. Personal history of breast carcinoma. EXAM: CT ABDOMEN AND PELVIS WITH CONTRAST TECHNIQUE: Multidetector CT imaging of the abdomen and pelvis was performed using the standard protocol following bolus administration of  intravenous contrast. CONTRAST:  144m OMNIPAQUE IOHEXOL 300 MG/ML  SOLN COMPARISON:  Pelvis MRI on 08/04/2020 FINDINGS: Lower Chest: Moderate left and small right pleural effusions, with left greater than right lower lobe atelectasis. Previous right mastectomy. Hepatobiliary: No hepatic masses identified. Gallbladder is unremarkable. No evidence of biliary ductal dilatation. Pancreas:  No mass or inflammatory changes. Spleen: Within normal limits in size and appearance. Adrenals/Urinary Tract: No masses identified. 5 cm left renal cyst noted. No evidence of ureteral calculi or hydronephrosis. Stomach/Bowel: Moderate hiatal hernia is seen. No evidence of obstruction, inflammatory process or abnormal fluid collections. Vascular/Lymphatic: No pathologically enlarged lymph nodes. No abdominal aortic aneurysm. Aortic atherosclerotic calcification noted. Reproductive: Markedly enlarged uterus is seen due to multiple partially calcified uterine fibroids. The largest fibroid is subserosal in location in the posterior uterine corpus measuring 15.6 cm. Thickening of the endometrium is seen in the fundal region uterus which measures up to 15 mm. Endometrial carcinoma cannot be excluded in a postmenopausal female. No separate adnexal masses are identified. Other: A large amount of ascites is seen. Diffuse mesenteric soft tissue stranding is seen as well as omental caking. Mild peritoneal thickening and enhancement is also seen in the lower abdomen pelvis. These findings are highly suspicious for peritoneal carcinomatosis. Musculoskeletal:  No suspicious bone lesions identified. IMPRESSION: Large amount of ascites, and diffuse mesenteric soft tissue stranding and omental caking, highly suspicious for peritoneal carcinomatosis. Markedly enlarged uterus due to multiple fibroids measuring up to 15 cm. Thickening of the endometrium in the fundal region up to 15 mm. Endometrial carcinoma cannot be excluded in a postmenopausal  female. Moderate hiatal hernia. Moderate left and small right pleural effusions, with bilateral lower lobe atelectasis. Electronically Signed   By: JMarlaine HindM.D.   On: 08/11/2020 17:14   [place]  Result Date: 08/18/2020 INDICATION: Patient with history of pelvic mass, carcinomatosis, recurrent ascites; request received for diagnostic and therapeutic paracentesis. EXAM: ULTRASOUND GUIDED DIAGNOSTIC AND THERAPEUTIC PARACENTESIS MEDICATIONS: 1% lidocaine to skin and subcutaneous tissue COMPLICATIONS: None immediate. PROCEDURE: Informed written consent was obtained from the patient after a discussion of the risks, benefits and alternatives to treatment. A timeout was performed prior to the initiation of the procedure. Initial ultrasound scanning demonstrates a small to moderate amount of ascites within the left mid to lower abdominal quadrant. The left mid to lower abdomen was prepped and draped in the usual sterile fashion. 1% lidocaine was used for local anesthesia. Following this, a 19 gauge, 10-cm, Yueh catheter was introduced. An ultrasound image was saved for documentation purposes. The paracentesis was performed. The catheter was removed and a dressing was applied. The patient tolerated the procedure well without immediate post procedural complication. FINDINGS: A total of approximately 2.1 liters of amber fluid was removed. Samples were sent to the laboratory as requested by the clinical team. IMPRESSION: Successful ultrasound-guided diagnostic and therapeutic paracentesis yielding 2.1 liters of peritoneal fluid. Read by:  Rowe Robert, PA-C Electronically Signed   By: Aletta Edouard M.D.   On: 08/18/2020 10:08   US Paracentesis  Result Date: 08/12/2020 INDICATION: Patient with a history of breast cancer now with suspected endometrial malignancy presents today with ascites. Interventional radiology asked to perform a therapeutic and diagnostic paracentesis. EXAM: ULTRASOUND GUIDED  PARACENTESIS MEDICATIONS: 1% lidocaine 10 mL COMPLICATIONS: None immediate PROCEDURE: Informed written consent was obtained from the patient after a discussion of the risks, benefits and alternatives to treatment. A timeout was performed prior to the initiation of the procedure. Initial ultrasound scanning demonstrates a large amount of ascites within the right lower abdominal quadrant. The right lower abdomen was prepped and draped in the usual sterile fashion. 1% lidocaine was used for local anesthesia. Following this, a 19 gauge, 7-cm, Yueh catheter was introduced. An ultrasound image was saved for documentation purposes. The paracentesis was performed. The catheter was removed and a dressing was applied. The patient tolerated the procedure well without immediate post procedural complication. FINDINGS: A total of approximately 4.8 L of light pink fluid was removed. Samples were sent to the laboratory as requested by the clinical team. IMPRESSION: Successful ultrasound-guided paracentesis yielding 4.8 liters of peritoneal fluid. Read by: Soyla Dryer, NP Electronically Signed   By: Lucrezia Europe M.D.   On: 08/12/2020 13:50   CT BIOPSY  Result Date: 08/18/2020 CLINICAL DATA:  Large pelvic mass, ascites and evidence of carcinomatosis by imaging. Prior paracentesis did not yield malignant cells by analysis of ascites and additional CT-guided omental biopsy is now performed. EXAM: CT GUIDED CORE BIOPSY OF OMENTUM ANESTHESIA/SEDATION: 2.0 mg IV Versed Total Moderate Sedation Time:  10 minutes. The patient's level of consciousness and physiologic status were continuously monitored during the procedure by Radiology nursing. The patient also received 4 mg IV Zofran for nausea. PROCEDURE: The procedure risks, benefits, and alternatives were explained to the patient. Questions regarding the procedure were encouraged and answered. The patient understands and consents to the procedure. A time-out was performed prior to  initiating the procedure. Prior to the biopsy, a separate paracentesis procedure was performed to remove ascites. This will be dictated separately. Localizing CT of the abdomen and pelvis was performed in a supine position. The anterior abdominal wall was prepped with chlorhexidine in a sterile fashion, and a sterile drape was applied covering the operative field. A sterile gown and sterile gloves were used for the procedure. Local anesthesia was provided with 1% Lidocaine. Under CT guidance, a 17 gauge trocar needle was advanced into anterior abdominal omental fat from a midline approach towards the left anterior peritoneal cavity. After confirming needle tip position, a total of 5 separate coaxial 18 gauge core biopsy samples were obtained of omentum and placed in formalin. Additional CT was performed. COMPLICATIONS: None FINDINGS: An area of anterior omental fat demonstrating infiltrative stranding was chosen inferior to the transverse colon. Solid tissue was obtained. IMPRESSION: CT-guided core biopsy performed of omental fat in the midline and left anterior abdomen just inferior to the transverse colon. Electronically Signed   By: Aletta Edouard M.D.   On: 08/18/2020 09:43   VAS [place] LOWER EXTREMITY VENOUS (DVT) (ONLY [facility] & [facility] 7a-7p)  Result Date: 08/11/2020  Lower Venous DVTStudy Indications: Swelling.  Comparison Study: no prior Performing Technologist: Abram Sander RVS  Examination Guidelines: A complete evaluation includes B-mode imaging, spectral Doppler, color Doppler, and power Doppler as needed of all accessible portions of each vessel. Bilateral testing is considered an integral part of a  complete examination. Limited examinations for reoccurring indications may be performed as noted. The reflux portion of the exam is performed with the patient in reverse Trendelenburg.  +-----+---------------+---------+-----------+----------+--------------+  RIGHTCompressibilityPhasicitySpontaneityPropertiesThrombus Aging +-----+---------------+---------+-----------+----------+--------------+ CFV  Full           Yes      Yes                                 +-----+---------------+---------+-----------+----------+--------------+   +---------+---------------+---------+-----------+----------+--------------+ LEFT     CompressibilityPhasicitySpontaneityPropertiesThrombus Aging +---------+---------------+---------+-----------+----------+--------------+ CFV      Full           Yes      Yes                                 +---------+---------------+---------+-----------+----------+--------------+ SFJ      Full                                                        +---------+---------------+---------+-----------+----------+--------------+ FV Prox  Full                                                        +---------+---------------+---------+-----------+----------+--------------+ FV Mid   Full                                                        +---------+---------------+---------+-----------+----------+--------------+ FV DistalFull                                                        +---------+---------------+---------+-----------+----------+--------------+ PFV      Full                                                        +---------+---------------+---------+-----------+----------+--------------+ POP      Full           Yes      Yes                                 +---------+---------------+---------+-----------+----------+--------------+ PTV      Full                                                        +---------+---------------+---------+-----------+----------+--------------+ PERO     None           No  No                   Acute          +---------+---------------+---------+-----------+----------+--------------+     Summary: RIGHT: - No evidence of common femoral vein  obstruction.  LEFT: - Findings consistent with acute deep vein thrombosis involving the left peroneal veins. - No cystic structure found in the popliteal fossa.  *See table(s) above for measurements and observations. Electronically signed by Servando Snare MD on 08/11/2020 at 5:30:50 PM.    Final     Pathology:  SURGICAL PATHOLOGY  CASE: [phone]  PATIENT: [name]  Surgical Pathology Report   Clinical History: Pelvic mass, ascites, and evidence of carcinomatosis.  Prior history of breast carcinoma. 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 (crm)   FINAL MICROSCOPIC DIAGNOSIS:   A. OMENTAL FAT, MIDLINE/LEFT, BIOPSY:  - Adenocarcinoma.  - See comment.   COMMENT:  The core biopsies show adenocarcinoma associated with extensive  desmoplasia. Immunohistochemistry shows the carcinoma is positive with  cytokeratin 20 and CDX2 and cytochemical stains are positive for  mucicarmine. The tumor is negative with GATA3, GCDFP, estrogen  receptor, progesterone receptor, PAX8, D2-40, cytokeratin 7, cytokeratin  5/6 and calretinin. The immunophenotype is most consistent with a  gastrointestinal primary, especially lower GI.   Case discussed with Dr. Jana Hakim: 08/21/2020 and 08/22/2020.  Assessment and Plan:  1.  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  Left peroneal vein DVT and pulmonary emboli 3.  Ascites and pleural effusion secondary to #1 4.  Ileus 5.  AKI 6.  History of stage IIIb breast cancer -Status post right modified radical mastectomy on 04/23/2011 for [id number], stage IIIB invasive ductal carcinoma, grade 3, ER PR positive, HER-2 negative -Status post 4 cycles of adjuvant chemotherapy with 5-FU, epirubicin, and cyclophosphamide completed on  07/12/2011 -Status post 4 cycles of adjuvant docetaxel completed on 10/04/2011 -Status post adjuvant radiation completed on 01/02/2012 -Completed 5 years of aromatase inhibitor in May 2018 7.  Deconditioning  Ms. Dunn has developed peritoneal carcinomatosis and has significant ascites.  Surgical pathology from omental fat consistent with adenocarcinoma.  Immunophenotype most consistent with GI primary, especially lower GI.  She has never had a colonoscopy.  Biopsy results have been discussed with the patient and her son.  We discussed that no therapy would be curative.  Additionally, her performance status may be too poor to tolerate chemotherapy.  Prognosis likely less than 2 weeks.  The patient's son did inquire about comfort measures/hospice for the patient. Agreeable to Hospice.  They are considering [facility].  Recommendations: 1.  Consider venting gastrostomy tube by IR for comfort measures. 2.  TOC consult placed for hospice-family still deciding on home with hospice versus [facility].  Thank you for this referral.   Mikey Bussing, DNP, AGPCNP-BC, AOCNP  Ms. Reine was interviewed and examined.  I reviewed the chart and imaging studies.  She has been diagnosed with metastatic adenocarcinoma involving an omental biopsy.  There is clinical and radiologic evidence of carcinomatosis with bowel obstruction.  There is no apparent primary tumor site, but I suspect a gastrointestinal malignancy.  The differential diagnosis includes gastric, small bowel, colorectal, and appendiceal carcinomas.  Ms. Dusseau does not appear to be a candidate for systemic chemotherapy.  She has an ileus versus obstruction  requiring NG tube placement.  She understands no therapy will be curative.  She and her son are in agreement with comfort care.  She appears to be a candidate for residential hospice.  Will make referral to [facility].  She could benefit from a venting gastrostomy if this can be  placed.  Julieanne Manson, MD

## 2020-08-22 NOTE — Progress Notes (Signed)
PT Cancellation Note  Patient Details Name: TIFFINE HENIGAN MRN: [id number] DOB: 02-12-1941   Cancelled Treatment:    Reason Eval/Treat Not Completed: Medical issues which prohibited therapy, patient up in chair, NG suction ongoing. Noted discusions ongoing for GOC/ Hospice. Will follow along for  Any PT needs, most likely will sign off soon.   Claretha Cooper 08/22/2020, 3:59 PM [place] Pager [phone] Office [phone]

## 2020-08-22 NOTE — Progress Notes (Signed)
PROGRESS NOTE    Brandi Bowen   YBO:[id number]  DOB: Jun 06, 1941  DOA: 08/11/2020     10  PCP: [name], PA-C  CC: abdominal distension  Hospital Course: [name] is an 79 yo CF with PMH uterine mass (u/k etiology currently), hypothyroidism, hyperlipidemia who presented to the hospital with abdominal distention.  She also has a known history of breast cancer in remission. She has recently been followed by GYN oncology for postmenopausal bleeding and underwent imaging studies which have shown large fibroids with ascites. Imaging studies performed in the ER were concerning for possible peritoneal carcinomatosis and possible underlying endometrial malignancy.  She also underwent duplex which revealed left peroneal DVT and was started on anticoagulation, transitioned to Xarelto and back to heparin drip on 08/16/2020. She previously underwent a paracentesis on 08/12/2020 which removed 4.8 L of peritoneal fluid however cytology was nondiagnostic.  She was evaluated by GYN oncology during hospitalization and recommended for tissue biopsy for diagnostic purposes.   She was able to be scheduled for percutaneous biopsy on 08/18/2020 with IR.  She underwent biopsy of omental fat pad performed under CT guidance with 5 core samples obtained and sent to pathology.  She also underwent repeat paracentesis on 08/18/2020 removing 2.1 L of fluid.  This did give her some further relief as her abdomen had become further distended during hospitalization.  On 08/19/16 she had a large amount of bilious vomit, ~500 cc and an NGT was placed. This was accompanied with abscence of flatus and BM also. A CT chest/abd/pelvis was done on 10/17 which showed: Bilateral PE, large left pleural effusion, no pulm nodules, moderate ascites, extensive omental nodularity, peritoneal carcinomatosis, calcified fibroid uterus.   Family discussion was held on the phone extensively on 10/17 with patient's brother, his wife,  and the patient's 2 sons. Options are still being considered however in light of everything present and her decreasing quality of life as is without having even started treatment (if able) plus the dynamics outpatient of patient getting to chemo appts etc (lives alone), family is considering hospice route, pending further discussions and talking with oncology as well.   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 GOC again discussed and patient and family are now pretty certain they will pursue hospice but still will talk formally with oncology regarding their opinion and advice.  Her NGT seems to be providing the most comfort and their preference is for it to remain in; whether that means going to residential hospice or home if able to have a portable suction machine.   Interval History:  We all reviewed the pathology results today (family on phone) and with patient in he room. She understands St IV diagnosis of cancer (adenocarcinoma with GI origin). The treatment would not be curative and she would likely not tolerate treatment well, plus quality of life would suffer greatly. She continues to be accepting of the diagnosis that we suspected all week; she "does not want to be a burden on my family". And therefore, prefer to go to residential hospice. They still would like to make sure they've discussed with oncology formally prior to making a formal decision as well (plus, is still full code, as they having been waiting for diagnosis and oncology conversation first).   Old records reviewed in assessment of this patient  ROS: Constitutional: negative for chills and fevers, Respiratory: negative for cough, Cardiovascular: negative for chest pain and Gastrointestinal: positive for Abdominal distention  Assessment & Plan: * Peritoneal carcinomatosis ([place]) - extensive and now contributing to probable ileus 2/2 N/V on 10/16; NGT now in place and draining significant  amount (but patient has relief with tube in) - family discussion held on 10/17 and daily; see hosp course above; but overall leaning towards hospice pending further gyn onc/onc discussion after further pathology available (pathology now back on 08/22/2020 consistent with adenocarcinoma with GI origin) - we have discussed code status, but at this time she has not made any decisions but this is an ongoing discussion with patient and family; for now remains full code - for now continue NGT, pain, and nausea control - NPO but ice chips okay, etc for comfort  -Patient and family still leaning towards hospice and I think they would prefer to go to residential hospice so that she has around the clock care as she may be too much care for family at home.  They also would like the NG tube to stay in since it provides the most relief from her severe nausea/vomiting.  She has had no appetite and mostly enjoys having ice chips -Prognosis is poor and patient would likely not survive more than 2 weeks on her current decline  Fibroids -Large fibroid noted on CT abdomen/pelvis.  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  Patient will be seen by oncology instead given new pathology results  Pleural effusion - large left pleural effusion, considered malignant until proven otherwise at this point - patient is not SOB or hypoxic; for now will leave alone unless symptomatic then pursue therapeutic relief (possibly may help with diagnosis pending omental bx results) - monitor for now  Pulmonary emboli ([place]) - see DVT as well - continue Lovenox for now  Ileus [facility]) - patient developed severe N/V on 10/16, approx 500 cc bilious vomitus; obtained CT on 10/17, no obvious  obstruction/compression making this more likely ileus from underlying significant carcinomatosis - continue NGT - see peritoneal carcinomatosis  Acute deep vein thrombosis (DVT) of left lower extremity ([place]) - left peroneal vein - likely provoked in setting of underlying suspected malignancy -Has been on heparin drip during hospitalization in anticipation of biopsy on 08/18/2020. Now has B/L PE, she is NPO due to severe N/V and ileus with NGT in place and cannot go back to Xarelto (treatment may also be discontinued soon if pursuing hospice as well), but for now will start on Lovenox 1 mg/kg BID -If pursuing hospice, can likely discontinue anticoagulation  Ascites -Paracentesis performed on 08/12/2020 that was nondiagnostic -Patient was developing worsening abdominal distention/fullness since her last paracentesis.  She underwent repeat paracentesis on 08/18/2020, removing 2.1 L fluid. -Follow-up cytology from paracentesis today, 08/18/2020 as last cytology was nondiagnostic - ascites already re-accumulating on repeat CT on 10/17  Physical deconditioning - patient lives alone and does not seem to have 24/7 support. Children live out of town; she does have friends closeby for help per son - patient has declined further in past 51 hours; no longer feel she is safe for home alone  -After multiple discussions over the past several days, family now wishing for residential hospice.  If she does go home she would have continuous involvement with family however they would still be concerned about providing adequate care and the preference is for placement somewhere  with 24/7 nursing and staff support   Antimicrobials: None  DVT prophylaxis: Heparin drip>>Lovenox on 10/17 Code Status: Full Family Communication: Brother bedside; talked to everyone else on phone, see above Disposition Plan: Status is: Inpatient  Remains inpatient appropriate because:Ongoing diagnostic testing needed not  appropriate for outpatient work up, Unsafe d/c plan, IV treatments appropriate due to intensity of illness or inability to take PO and Inpatient level of care appropriate due to severity of illness   Dispo: The patient is from: Home              Anticipated d/c is to: pending further discussions Hospice possibly               Anticipated d/c date is: > 3 days              Patient currently is not medically stable to d/c. Ready once hospice plans figured out  Objective: Blood pressure 130/84, pulse 98, temperature 98.5 F (36.9 C), temperature source Oral, resp. rate 17, height 5\' 5"  (1.651 m), weight 86.2 kg, SpO2 90 %.  Examination: General appearance: weak but pleasant; no distress; NGT in place Head: Normocephalic, without obvious abnormality, atraumatic Eyes: EOMI Lungs: dull left sided breath sounds; no wheezing Heart: regular rate and rhythm and S1, S2 normal Abdomen: Irregular firmness now able to be appreciated throughout almost entire abdomen consistent with underlying enlarged uterus/tumor.  Bowel sounds absent Extremities: 1-2+ LE edema now worse in RLE; previously worse in LLE but now in RLE Skin: mobility and turgor normal Neurologic: Grossly normal  Consultants:   GYN onc  Oncology   IR  Procedures:   n/a  Data Reviewed: I have personally reviewed following labs and imaging studies No results found for this or any previous visit (from the past 24 hour(s)).  No results found for this or any previous visit (from the past 240 hour(s)).   Radiology Studies: No results found. CT ABDOMEN PELVIS W CONTRAST  Final Result    CT CHEST W CONTRAST  Final Result    US Paracentesis  Final Result    CT BIOPSY  Final Result    DG Abd 1 View  Final Result    CT ABDOMEN PELVIS WO CONTRAST  Final Result    US Paracentesis  Final Result    VAS [place] LOWER EXTREMITY VENOUS (DVT) (ONLY [facility] & [facility] 7a-7p)  Final Result    DG Chest 2 View  Final Result    CT Abdomen  Pelvis W Contrast  Final Result      Scheduled Meds: . enoxaparin (LOVENOX) injection  85 mg Subcutaneous BID   PRN Meds: bisacodyl, morphine injection, ondansetron (ZOFRAN) IV, phenol, promethazine Continuous Infusions: . sodium chloride 75 mL/hr at 08/22/20 0831      LOS: 10 days  Time spent: Greater than 50% of the 35 minute visit was spent in counseling/coordination of care for the patient as laid out in the A&P.   Dwyane Dee, MD [facility] 08/22/2020, 2:49 PM

## 2020-08-22 NOTE — Plan of Care (Signed)
  Problem: Consults Goal: Venous Thromboembolism Patient Education Description: See Patient Education Module for education specifics. Outcome: Progressing   Problem: Education: Goal: Knowledge of General Education information will improve Description: Including pain rating scale, medication(s)/side effects and non-pharmacologic comfort measures Outcome: Progressing   Problem: Health Behavior/Discharge Planning: Goal: Ability to manage health-related needs will improve Outcome: Progressing   Problem: Clinical Measurements: Goal: Ability to maintain clinical measurements within normal limits will improve Outcome: Progressing

## 2020-08-23 DIAGNOSIS — J910 Malignant pleural effusion: Secondary | ICD-10-CM | POA: Diagnosis not present

## 2020-08-23 DIAGNOSIS — C786 Secondary malignant neoplasm of retroperitoneum and peritoneum: Secondary | ICD-10-CM | POA: Diagnosis not present

## 2020-08-23 DIAGNOSIS — R180 Malignant ascites: Secondary | ICD-10-CM | POA: Diagnosis not present

## 2020-08-23 DIAGNOSIS — C269 Malignant neoplasm of ill-defined sites within the digestive system: Secondary | ICD-10-CM | POA: Diagnosis not present

## 2020-08-23 NOTE — TOC Initial Note (Addendum)
Transition of Care [facility]) - Initial/Assessment Note    Patient Details  Name: Brandi Bowen MRN: [id number] Date of Birth: 23-Jun-1941  [facility]) CM/SW Contact:    Lynnell Catalan, RN Phone Number: 08/23/2020, 3:36 PM  Clinical Narrative:                  [facility] consult for hospice. Spoke with pt and son [name] at the bedside. [name] and pt both agree that they would like the first residential hospice bed available and are fine with both [facility] and [facility] [place]. [place] liaison did confirm that there is not a bed available today and there are several ahead of her on the list. [facility] liaison will touch base with this CM in the morning with a better idea of bed availability. TOC will continue to follow.         Expected Discharge Date: 08/16/20                   Activities of Daily Living Home Assistive Devices/Equipment: Eyeglasses (reading glasses) ADL Screening (condition at time of admission) Patient's cognitive ability adequate to safely complete daily activities?: Yes Is the patient deaf or have difficulty hearing?: No Does the patient have difficulty seeing, even when wearing glasses/contacts?: No Does the patient have difficulty concentrating, remembering, or making decisions?: No Patient able to express need for assistance with ADLs?: Yes Does the patient have difficulty dressing or bathing?: No Independently performs ADLs?: Yes (appropriate for developmental age) Does the patient have difficulty walking or climbing stairs?: No Weakness of Legs: None Weakness of Arms/Hands: None  Permission Sought/Granted                  Emotional Assessment              Admission diagnosis:  Ascites [R18.8] Patient Active Problem List   Diagnosis Date Noted  . Peritoneal carcinomatosis ([place]) 08/20/2020  . Pulmonary emboli ([place]) 08/20/2020  . Pleural effusion 08/20/2020  . Ileus ([place]) 08/19/2020  . Physical deconditioning 08/17/2020  . Uterine cancer ([place])  08/15/2020  . Ascites 08/11/2020  . Acute deep vein thrombosis (DVT) of left lower extremity ([place]) 08/11/2020  . Fibroids 01/17/2020  . History of breast cancer 03/04/2017  . Post-menopausal bleeding 01/12/2014   PCP:  [name], PA-C Pharmacy:   [facility] [address] [place], [place]. AT [address] [place] [address] Phone: [phone] Fax: [phone]     Social Determinants of Health (SDOH) Interventions    Readmission Risk Interventions No flowsheet data found.

## 2020-08-23 NOTE — Progress Notes (Addendum)
IP PROGRESS NOTE  Subjective: She reports difficulty sleeping last night. Denies pain. No nausea. She is not passing gas, no bowel movement.   Objective: Vital signs in last 24 hours: Temp:  [97.7 F (36.5 C)-98.5 F (36.9 C)] 98.2 F (36.8 C) (10/20 0611) Pulse Rate:  [93-98] 98 (10/20 0611) Resp:  [14-17] 14 (10/20 0611) BP: (106-130)/(83-87) 123/87 (10/20 0611) SpO2:  [90 %-93 %] 93 % (10/20 0611)  Intake/Output from previous day: 10/19 0701 - 10/20 0700 In: 883.9 [P.O.:210; I.V.:673.9] Out: 1750 [Emesis/NG output:1750] Intake/Output this shift: No intake/output data recorded.  General: chronically ill appearing elderly female, no distress HEENT: no thrush GI: large firm mass right abdomen; NG tube in place Vascular: trace bilateral pretibial edema  Lab Results:  Recent Labs    08/21/20 0533  WBC 17.3*  HGB 11.8*  HCT 37.3  PLT 445*   BMET Recent Labs    08/21/20 0533  NA 144  K 3.4*  CL 95*  CO2 30  GLUCOSE 107*  BUN 33*  CREATININE 1.32*  CALCIUM 8.0*    Studies/Results: No results found.  Medications: medications reviewed  Assessment/Plan: 1.  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  Left peroneal vein DVT and pulmonary emboli-on Lovenox 3.  Ascites and pleural effusion secondary to #1 4.  Ileus 5.  AKI 6.  History of stage IIIb breast cancer -Status post right modified radical mastectomy on 04/23/2011 for [id number], stage IIIB invasive ductal carcinoma, grade 3, ER PR positive, HER-2 negative -Status post 4 cycles of adjuvant chemotherapy with 5-FU, epirubicin, and cyclophosphamide completed on 07/12/2011 -Status post 4 cycles of adjuvant docetaxel completed on 10/04/2011 -Status post adjuvant radiation completed on  01/02/2012 -Completed 5 years of aromatase inhibitor in May 2018 7.  Deconditioning   Ms. 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 Diagnosis reviewed with Ms. Sinagra and her son in the room, multiple family members on the phone. They understand she is not a candidate for systemic therapy. We again reviewed the recommendation for supportive/comfort care. Ms. Niehaus and family members are in agreement. [name] discussed code status. She will be placed on NO CODE BLUE status. They are interested in [facility], referral has been placed. She is interested in a venting gastrostomy. Referral made to interventional radiology to see if this is an option.    LOS: 11 days    Ned Card, AGPCNP-BC 08/23/2020 7:45 AM Ms. Aull was interviewed and examined.  She continues to have high output from the NG tube and no stool.  She appears to have functional bowel obstruction requiring NG decompression.  I discussed the prognosis and management options with Ms. Cegielski and multiple family members this morning.  She agrees to comfort care and a hospice referral.  She appears to be a candidate for [facility].  We discussed CPR and ACLS issues.  She will be placed on a no CODE BLUE status.  We will ask interventional radiology to evaluate her for a palliative venting gastrostomy.  I will be out until 08/28/2020.  Dr. Burr Medico will check on her tomorrow.  Please call oncology as needed.

## 2020-08-23 NOTE — Progress Notes (Signed)
OT Cancellation and Discharge Note  Patient Details Name: PAISLIE TESSLER MRN: [id number] DOB: 1941-10-30   Cancelled Treatment:    Patient discharged from OT services secondary to medical decline - will need to re-order OT to resume therapy services.Npted trending towards Hospice care.  Please see latest occupational therapy progress note for current level of functioning and progress toward goals.     Reason Eval/Treat Not Completed: Medical issues which prohibited therapy  Julien Girt 08/23/2020, 11:52 AM

## 2020-08-23 NOTE — Progress Notes (Signed)
PROGRESS NOTE    Brandi Bowen  JQZ:[id number] DOB: 07/10/1941 DOA: 08/11/2020 PCP: Gwendel Hanson   Brief Narrative: 79 yo CF with PMH uterine mass (u/k etiology currently), hypothyroidism, hyperlipidemia who presented to the hospital with abdominal distention.  She also has a known history of breast cancer in remission. She has recently been followed by GYN oncology for postmenopausal bleeding and underwent imaging studies which have shown large fibroids with ascites. Imaging studies performed in the ER were concerning for possible peritoneal carcinomatosis and possible underlying endometrial malignancy.  She also underwent duplex which revealed left peroneal DVT and was started on anticoagulation, transitioned to Xarelto and back to heparin drip on 08/16/2020. She previously underwent a paracentesis on 08/12/2020 which removed 4.8 L of peritoneal fluid however cytology was nondiagnostic.  She was evaluated by GYN oncology during hospitalization and recommended for tissue biopsy for diagnostic purposes.   She was able to be scheduled for percutaneous biopsy on 08/18/2020 with IR.  She underwent biopsy of omental fat pad performed under CT guidance with 5 core samples obtained and sent to pathology.  She also underwent repeat paracentesis on 08/18/2020 removing 2.1 L of fluid.  This did give her some further relief as her abdomen had become further distended during hospitalization.  On 08/19/16 she had a large amount of bilious vomit, ~500 cc and an NGT was placed. This was accompanied with abscence of flatus and BM also. A CT chest/abd/pelvis was done on 10/17 which showed: Bilateral PE, large left pleural effusion, no pulm nodules, moderate ascites, extensive omental nodularity, peritoneal carcinomatosis, calcified fibroid uterus.   Assessment & Plan:   Principal Problem:   Peritoneal carcinomatosis ([place]) Active Problems:   Fibroids   Ascites   Acute deep vein thrombosis (DVT)  of left lower extremity (HCC)   Physical deconditioning   Ileus (HCC)   Pulmonary emboli (HCC)   Pleural effusion   #1 peritoneal carcinomatosis with pathology positive for adenocarcinoma of GI origin.  Patient has extensive peritoneal carcinomatosis and possible ileus, with NG tube in place draining quite a lot of drainage with ongoing abdominal distention.  Patient has had no bowel movements or passing gas.  Seen by oncology today.  IR unable to do a venting gastrostomy.  Case manager consulted for [facility] in [place] versus [facility] wherever bed opens up earlier.  She has very poor prognosis [name] discussed with patient and family today.  I discussed with patient and her son in the room. Also noted she has large fibroids highly suspicious for underlying malignancy. Large left pleural effusion likely malignant.  #2 history of PE and acute left lower extremity DVT on Lovenox for now.  #3 goals of care discussed with patient and family patient is DNR and waiting to go to residential hospice.    Estimated body mass index is 31.62 kg/m as calculated from the following:   Height as of this encounter: 5\' 5"  (1.651 m).   Weight as of this encounter: 86.2 kg.  DVT prophylaxis: Lovenox stopped 08/23/2020 since patient is waiting to go to hospice [facility]. Code Status: DNR Family Communication: Discussed with patient's son disposition Plan:  Status is: Inpatient Dispo: The patient is from: Home              Anticipated d/c is to: Hospice              Anticipated d/c date is: 1 day  Patient currently is medically stable to d/c. Consultants:   Oncology, interventional radiology  Procedures: NG tube Antimicrobials: None Subjective: Patient resting in bed NG tube in place son by the bedside reports she did not sleep well last night  Objective: Vitals:   08/22/20 0542 08/22/20 1405 08/22/20 2126 08/23/20 0611  BP: 122/77 130/84 106/83 123/87  Pulse: 98 98  93 98  Resp: 14 17 14 14   Temp: 97.8 F (36.6 C) 98.5 F (36.9 C) 97.7 F (36.5 C) 98.2 F (36.8 C)  TempSrc: Oral Oral Oral Oral  SpO2: (!) 89% 90% 91% 93%  Weight:      Height:        Intake/Output Summary (Last 24 hours) at 08/23/2020 1208 Last data filed at 08/23/2020 0625 Gross per 24 hour  Intake 419.95 ml  Output 1400 ml  Net -980.05 ml   Filed Weights   08/11/20 1833 08/11/20 2150  Weight: 81.6 kg 86.2 kg    Examination:  General exam: Appears calm and comfortable  Respiratory system: Clear to auscultation. Respiratory effort normal. Cardiovascular system: S1 & S2 heard, RRR. No JVD, murmurs, rubs, gallops or clicks. No pedal edema. Gastrointestinal system: Abdomen is distended, firm and tender. No organomegaly or masses felt. Normal bowel sounds heard. Central nervous system: Alert and oriented. No focal neurological deficits. Extremities: Symmetric 5 x 5 power. Skin: No rashes, lesions or ulcers Psychiatry: Judgement and insight appear normal. Mood & affect appropriate.     Data Reviewed: I have personally reviewed following labs and imaging studies  CBC: Recent Labs  Lab 08/17/20 0559 08/18/20 0100 08/19/20 0839 08/20/20 0627 08/21/20 0533  WBC 16.9* 12.2* 14.9* 14.9* 17.3*  NEUTROABS  --   --   --  12.8* 15.3*  HGB 12.3 10.8* 12.9 11.9* 11.8*  HCT 38.2 33.1* 40.7 37.7 37.3  MCV 81.3 81.9 83.2 84.7 83.8  PLT 525* 481* 466* 452* 338*   Basic Metabolic Panel: Recent Labs  Lab 08/17/20 0559 08/18/20 0100 08/19/20 0839 08/20/20 0627 08/21/20 0533  NA 131* 135 135 138 144  K 4.2 3.6 4.2 3.6 3.4*  CL 89* 99 92* 92* 95*  CO2 29 25 29 31 30   GLUCOSE 143* 121* 136* 107* 107*  BUN 36* 38* 40* 34* 33*  CREATININE 1.51* 1.25* 1.55* 1.40* 1.32*  CALCIUM 8.8* 7.2* 8.8* 8.6* 8.0*  MG 2.7* 2.3 2.7* 2.5* 2.6*   GFR: Estimated Creatinine Clearance: 36.9 mL/min (A) (by C-G formula based on SCr of 1.32 mg/dL (H)). Liver Function Tests: No results for  input(s): AST, ALT, ALKPHOS, BILITOT, PROT, ALBUMIN in the last 168 hours. No results for input(s): LIPASE, AMYLASE in the last 168 hours. No results for input(s): AMMONIA in the last 168 hours. Coagulation Profile: Recent Labs  Lab 08/18/20 0100  INR 1.5*   Cardiac Enzymes: No results for input(s): CKTOTAL, CKMB, CKMBINDEX, TROPONINI in the last 168 hours. BNP (last 3 results) No results for input(s): PROBNP in the last 8760 hours. HbA1C: No results for input(s): HGBA1C in the last 72 hours. CBG: No results for input(s): GLUCAP in the last 168 hours. Lipid Profile: No results for input(s): CHOL, HDL, LDLCALC, TRIG, CHOLHDL, LDLDIRECT in the last 72 hours. Thyroid Function Tests: No results for input(s): TSH, T4TOTAL, FREET4, T3FREE, THYROIDAB in the last 72 hours. Anemia Panel: No results for input(s): VITAMINB12, FOLATE, FERRITIN, TIBC, IRON, RETICCTPCT in the last 72 hours. Sepsis Labs: No results for input(s): PROCALCITON, LATICACIDVEN in the last 168 hours.  No  results found for this or any previous visit (from the past 240 hour(s)).       Radiology Studies: No results found.      Scheduled Meds: . enoxaparin (LOVENOX) injection  85 mg Subcutaneous BID   Continuous Infusions: . sodium chloride 75 mL/hr at 08/22/20 2222     LOS: 11 days    Georgette Shell, MD 08/23/2020, 12:08 PM

## 2020-08-23 NOTE — Progress Notes (Signed)
Physical Therapy Discharge Patient Details Name: Brandi Bowen MRN: [id number] DOB: [birth year] Today's Date: 08/23/2020 Time:  -     Patient discharged from PT services secondary to medical decline - will need to re-order PT to resume therapy services.Npted trending towards Hospice care.  Please see latest therapy progress note for current level of functioning and progress toward goals.     GP     Claretha Cooper 08/23/2020, 8:06 AM [place] Pager [phone] Office [phone]

## 2020-08-23 NOTE — Progress Notes (Signed)
Manufacturing engineer [facility]) [facility]  Referral received for [facility].   There are no beds to offer Brandi Bowen today.  Will keep her on our list and update TOC manager and family once our bed status changes.  Thank you, Venia Carbon RN, BSN, [facility] Liaison

## 2020-08-23 NOTE — Progress Notes (Signed)
Interventional Radiology received request for venting gastrostomy tube. Patient's imaging/clinical history reviewed by Dr. Kathlene Cote. Patient's anatomy, peritoneal carcinomatosis burden and ascites presents a high risk and the patient is not a candidate for gastrostomy tube placement in IR. Dr. Feng/Oncology team notified.   The order for this procedure will be deleted.  Please call IR with any questions.  Soyla Dryer, [place] [phone] 08/23/2020, 11:22 AM

## 2020-08-24 NOTE — Progress Notes (Signed)
Brief oncology note:  I stopped by to visit with the patient and her son today.  The patient is resting very quietly.  She does not open her eyes when I enter the room.  The patient's son states that she is not having any pain today.  No uncontrolled symptoms per his report.  Note from IR has been reviewed and due to the patient's anatomy, peritoneal carcinomatosis burden, and ascites, placement of gastrostomy tube is high risk.  Therefore, they do not plan to proceed with gastrostomy tube placement.  NG tube remains in place and continues to drain without any difficulty.  Awaiting bed at [facility], but the patient's son is open to other residential hospice facilities.  Will defer to [facility] team and hospice liaison regarding residential hospice placement.  Mikey Bussing, DNP, AGPCNP-BC, AOCNP Mon/Tues/Thurs/Fri 7am-5pm; Off Wednesdays Cell: [phone]

## 2020-08-24 NOTE — Discharge Summary (Signed)
Physician Discharge Summary  Brandi Bowen TFT:[id number] DOB: [birth year] DOA: 08/11/2020  PCP: [name], PA-C  Admit date: 08/11/2020 Discharge date: 08/24/2020  Admitted From home Disposition: Hospice Recommendations for Outpatient Follow-up: Hospice discharge  Home Health: None Equipment/Devices hospice discharge Discharge Condition: Hospice  CODE STATUS: DNR Diet recommendation: Comfort food Brief/Interim Summary:79 yo CF with PMH uterine mass (u/k etiology currently), hypothyroidism, hyperlipidemia who presented to the hospital with abdominal distention. She also has a known history of breast cancer in remission. She has recently been followed by GYN oncology for postmenopausal bleeding and underwent imaging studies which have shown large fibroids with ascites. Imaging studies performed in the ER were concerning for possible peritoneal carcinomatosis and possible underlying endometrial malignancy. She also underwent duplex which revealed left peroneal DVT and was started on anticoagulation, transitioned to Xarelto and back to heparin drip on 08/16/2020. She previously underwent a paracentesis on 08/12/2020 which removed 4.8 L of peritoneal fluid however cytology was nondiagnostic.  She was evaluated by GYN oncology during hospitalization and recommended for tissue biopsy for diagnostic purposes.  She was able to be scheduled for percutaneous biopsy on 08/18/2020 with IR. She underwent biopsy of omental fat pad performed under CT guidance with 5 core samples obtained and sent to pathology.  She also underwent repeat paracentesis on 08/18/2020 removing 2.1 L of fluid. This did give her some further relief as her abdomen had become further distended during hospitalization.  On 08/19/16 she had a large amount of bilious vomit, ~500 cc and an NGT was placed. This was accompanied with abscence of flatus and BM also. A CT chest/abd/pelvis was done on 10/17 which showed: Bilateral  PE, large left pleural effusion, no pulm nodules, moderate ascites, extensive omental nodularity, peritoneal carcinomatosis, calcified fibroid uterus.    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  Patient has extensive peritoneal carcinomatosis and possible ileus, with NG tube in place draining quite a lot of drainage with ongoing abdominal distention.  Patient has had no bowel movements or passing gas.  She has very poor prognosis [name] discussed with patient and family today.Also noted she has large fibroids highly suspicious for underlying malignancy. Large left pleural effusion likely malignant. Continue NG tube suctioning for comfort  #2 history of PE and acute left lower extremity DVT  #3 goals of care discussed with patient and family patient is DNR and being discharged to hospice today.  Estimated body mass index is 31.62 kg/m as calculated from the following:   Height as of this encounter: 5\' 5"  (1.651 m).   Weight as of this encounter: 86.2 kg.  Discharge Instructions  Discharge Instructions    Diet - low sodium heart healthy   Complete by: As directed    Diet - low sodium heart healthy   Complete by: As directed    Increase activity slowly   Complete by: As directed    Increase activity slowly   Complete by: As directed    No wound care   Complete by: As directed      Allergies as of 08/24/2020      Reactions   Shrimp [shellfish Allergy]       Medication  List    STOP taking these medications   atenolol 50 MG tablet Commonly known as: TENORMIN   Cholecalciferol 125 MCG (5000 UT) capsule Commonly known as: [facility] Vit D 5000 High-Potency    doxycycline 100 MG capsule Commonly known as: VIBRAMYCIN   Fish Oil 1000 MG Caps   multivitamin capsule   ONE-A-DAY BONE STRENGTH PO   PROBIOTIC DAILY PO   vitamin C 500 MG tablet Commonly known as: ASCORBIC ACID     TAKE these medications   Meclizine HCl 25 MG Chew Commonly known as: Bonine Chew 1 tablet (25 mg total) by mouth daily as needed (nausea). What changed: how much to take       Follow-up Information    [name], PA-C Follow up.   Specialty: Cardiology Why: Make a follow up hospital appointment with PCP  Contact information: [facility]  [address] [place] [address] [phone]              Allergies  Allergen Reactions  . Shrimp [Shellfish Allergy]     Consultations:  Oncology and palliative care.   Procedures/Studies: CT ABDOMEN PELVIS WO CONTRAST  Result Date: 08/14/2020 CLINICAL DATA:  Abdominal distension, history of ascites and recent paracentesis EXAM: CT ABDOMEN AND PELVIS WITHOUT CONTRAST TECHNIQUE: Multidetector CT imaging of the abdomen and pelvis was performed following the standard protocol without IV contrast. COMPARISON:  08/11/2020 FINDINGS: Lower chest: Lung bases demonstrate bilateral pleural effusions left considerably greater than right with lower lobe consolidation also left greater than right. Large hiatal hernia is seen. Hepatobiliary: No focal liver abnormality is seen. No gallstones, gallbladder wall thickening, or biliary dilatation. Pancreas: Scattered calcifications are noted consistent with chronic pancreatitis. Spleen: Normal in size without focal abnormality. Adrenals/Urinary Tract: Adrenal glands are unremarkable. Kidneys demonstrate nonobstructing stone in the lower pole of the right kidney stable from the previous exam. Left renal cyst is seen. No obstructive changes are noted. The bladder is decompressed. Stomach/Bowel: No obstructive or inflammatory changes of the colon are seen. The  appendix is within normal limits. Small bowel and stomach are unremarkable aside from the known hiatal hernia. Vascular/Lymphatic: Aortic atherosclerosis. No enlarged abdominal or pelvic lymph nodes. Reproductive: Uterus is significantly enlarged with multiple calcified uterine fibroids similar to that seen on prior exam. Persistent thickening of the endometrium in the fundus is noted. This is stable from the prior exam. Adnexa are not well visualized. Other: Ascites is identified less than that seen on the prior exam consistent with the recent interval paracentesis. Persistent peritoneal thickening is noted suggestive of carcinomatosis. This is less well appreciated on the current exam given the lack of IV contrast. Musculoskeletal: Degenerative changes of lumbar spine are noted IMPRESSION: Bilateral pleural effusions left greater than right which have increased in the interval from the recent exam. Associated lower lobe consolidation is noted. Ascites, improved from the prior exam consistent with the recent paracentesis. 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 Nonobstructing right renal stone. Multiple calcified uterine fibroids and thickening of the endometrium similar to that noted on the prior exam. Electronically Signed   By: Inez Catalina M.D.   On: 08/14/2020 20:15   DG Chest 2 View  Result Date: 08/11/2020 CLINICAL DATA:  Shortness of breath. Abdominal distention. Ascites. EXAM: CHEST - 2 VIEW COMPARISON:  None. FINDINGS: Moderate left pleural effusion and small right pleural effusion are seen. Atelectasis or consolidation is seen in the left lung base. Upper lung fields are clear. Heart  size is within normal limits. IMPRESSION: Moderate left and small right pleural effusions. Left basilar atelectasis versus consolidation. Electronically Signed   By: Marlaine Hind M.D.   On: 08/11/2020 17:17   DG Abd 1 View  Result Date: 08/16/2020 CLINICAL DATA:  Constipation. EXAM:  ABDOMEN - 1 VIEW COMPARISON:  None. FINDINGS: The bowel gas pattern is normal. Mild amount of residual contrast and stool is seen throughout the colon. No radio-opaque calculi or other significant radiographic abnormality are seen. IMPRESSION: No evidence of bowel obstruction or ileus. Electronically Signed   By: Marijo Conception M.D.   On: 08/16/2020 08:17   CT CHEST W CONTRAST  Result Date: 08/20/2020 CLINICAL DATA:  Pleural effusion, malignancy suspected, uterine mass, bowel obstruction suspected, recent omental biopsy EXAM: CT CHEST, ABDOMEN, AND PELVIS WITH CONTRAST TECHNIQUE: Multidetector CT imaging of the chest, abdomen and pelvis was performed following the standard protocol during bolus administration of intravenous contrast. CONTRAST:  58mL OMNIPAQUE IOHEXOL 300 MG/ML  SOLN COMPARISON:  CT abdomen pelvis, 08/14/2020 FINDINGS: CT CHEST FINDINGS Cardiovascular: Aortic atherosclerosis. Normal heart size. No pericardial effusion. There is incidental note of bilateral pulmonary embolism, with generally nonocclusive lobar to segmental embolus present bilaterally. Mediastinum/Nodes: No enlarged mediastinal, hilar, or axillary lymph nodes. Small hiatal hernia. Thyroid gland, trachea, and esophagus demonstrate no significant findings. Lungs/Pleura: There are moderate to large left, small right pleural effusions and associated atelectasis or consolidation. There is no pulmonary or pleural nodularity appreciated. Musculoskeletal: No chest wall mass or suspicious bone lesions identified. Status post right mastectomy. CT ABDOMEN PELVIS FINDINGS Hepatobiliary: No solid liver abnormality is seen. No gallstones, gallbladder wall thickening, or biliary dilatation. Pancreas: Calcifications of the pancreatic parenchyma. No pancreatic ductal dilatation or surrounding inflammatory changes. Spleen: Normal in size without significant abnormality. Adrenals/Urinary Tract: Adrenal glands are unremarkable. Small nonobstructive  calculus of the inferior pole of the right kidney. No hydronephrosis. Bladder is unremarkable. Stomach/Bowel: Stomach is within normal limits. Appendix appears normal. No evidence of bowel wall thickening, distention, or inflammatory changes. Vascular/Lymphatic: Aortic atherosclerosis. No enlarged abdominal or pelvic lymph nodes. Reproductive: Redemonstrated bulky, partially calcified fibroid uterus Other: No abdominal wall hernia or abnormality. Redemonstrated moderate volume ascites throughout the abdomen pelvis with extensive peritoneal thickening and omental nodularity throughout the abdomen, similar to prior examination. Musculoskeletal: No acute or significant osseous findings. IMPRESSION: 1. There is incidental note of bilateral pulmonary embolism, with generally nonocclusive lobar to segmental embolus present bilaterally. RV LV ratio and main pulmonary artery caliber are normal. 2. There are moderate to large left, small right pleural effusions and associated atelectasis or consolidation. 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 3. Redemonstrated moderate volume ascites throughout the abdomen pelvis with extensive peritoneal thickening and omental nodularity throughout the abdomen, similar to prior examination, consistent with peritoneal carcinomatosis. As on prior examinations, there is no obvious primary malignancy in the abdomen or pelvis and primary differential considerations include ovarian, endometrial, and recurrent breast malignancy. 4. Redemonstrated bulky, partially calcified fibroid uterus. 5. No evidence of bowel obstruction. 6. Other chronic and incidental findings as above. 7. Aortic Atherosclerosis (ICD10-I70.0). These results were called by telephone at the time of interpretation on 08/20/2020 at 9:53 am to provider DAVID  Sabino Gasser , who verbally acknowledged these results. Electronically Signed   By: Eddie Candle M.D.   On: 08/20/2020 10:01   MR PELVIS WO CONTRAST  Result Date: 08/05/2020 CLINICAL DATA:  Right lower quadrant pain, uterine leiomyoma EXAM: MRI PELVIS WITHOUT CONTRAST TECHNIQUE: Multiplanar multisequence MR imaging of the pelvis was performed. No intravenous contrast was administered. COMPARISON:  Pelvic ultrasound, 04/27/2020 FINDINGS: Examination is generally somewhat limited by motion artifact throughout. Urinary Tract:  No abnormality visualized. Bowel:  Unremarkable visualized pelvic bowel loops. Vascular/Lymphatic: No pathologically enlarged lymph nodes. No significant vascular abnormality seen. Reproductive: There are numerous bulky uterine fibroids, most notably a very large subserosal or pedunculated fibroid of the posterior body measuring approximately 16.7 x 9.2 x 9.7 cm (series 4, image 17). Overall dimensions of the uterus are at least 17.7 x 14.7 x 13.2 cm. The ovaries are not clearly distinguished due to motion artifact and lack of intravenous contrast. Other: There is large volume ascites throughout the lower abdomen and pelvis. Musculoskeletal: No suspicious bone lesions identified. IMPRESSION: 1. Examination is generally limited by motion artifact throughout as well as lack of intravenous contrast. 2. Numerous bulky uterine fibroids, most notably a very large subserosal or pedunculated fibroid of the posterior body measuring approximately 16.7 x 9.2 x 9.7 cm. Overall dimensions of the uterus are at least 17.7 x 14.7 x 13.2 cm. 3. The ovaries are not clearly distinguished. 4. Large volume ascites throughout the lower abdomen and pelvis. Electronically Signed   By: Eddie Candle M.D.   On: 08/05/2020 15:20   CT ABDOMEN PELVIS W CONTRAST  Result Date: 08/20/2020 CLINICAL DATA:  Pleural effusion, malignancy suspected, uterine mass, bowel obstruction suspected, recent omental biopsy EXAM: CT CHEST,  ABDOMEN, AND PELVIS WITH CONTRAST TECHNIQUE: Multidetector CT imaging of the chest, abdomen and pelvis was performed following the standard protocol during bolus administration of intravenous contrast. CONTRAST:  33mL OMNIPAQUE IOHEXOL 300 MG/ML  SOLN COMPARISON:  CT abdomen pelvis, 08/14/2020 FINDINGS: CT CHEST FINDINGS Cardiovascular: Aortic atherosclerosis. Normal heart size. No pericardial effusion. There is incidental note of bilateral pulmonary embolism, with generally nonocclusive lobar to segmental embolus present bilaterally. Mediastinum/Nodes: No enlarged mediastinal, hilar, or axillary lymph nodes. Small hiatal hernia. Thyroid gland, trachea, and esophagus demonstrate no significant findings. Lungs/Pleura: There are moderate to large left, small right pleural effusions and associated atelectasis or consolidation. There is no pulmonary or pleural nodularity appreciated. Musculoskeletal: No chest wall mass or suspicious bone lesions identified. Status post right mastectomy. CT ABDOMEN PELVIS FINDINGS Hepatobiliary: No solid liver abnormality is seen. No gallstones, gallbladder wall thickening, or biliary dilatation. Pancreas: Calcifications of the pancreatic parenchyma. No pancreatic ductal dilatation or surrounding inflammatory changes. Spleen: Normal in size without significant abnormality. Adrenals/Urinary Tract: Adrenal glands are unremarkable. Small nonobstructive calculus of the inferior pole of the right kidney. No hydronephrosis. Bladder is unremarkable. Stomach/Bowel: Stomach is within normal limits. Appendix appears normal. No evidence of bowel wall thickening, distention, or inflammatory changes. Vascular/Lymphatic: Aortic atherosclerosis. No enlarged abdominal or pelvic lymph nodes. Reproductive: Redemonstrated bulky, partially calcified fibroid uterus Other: No abdominal wall hernia or abnormality. Redemonstrated moderate volume ascites throughout the abdomen pelvis with extensive peritoneal  thickening and omental nodularity throughout the abdomen, similar to prior examination. Musculoskeletal: No acute or significant osseous findings. IMPRESSION: 1. There is incidental note of bilateral pulmonary embolism, with generally nonocclusive lobar to segmental embolus present bilaterally. RV LV ratio and main pulmonary artery caliber are normal. 2. There are moderate to large left, small right pleural effusions and associated atelectasis or consolidation. There is no pulmonary or pleural nodularity appreciated, although in general these effusions are highly suspicious for malignant effusions given history  of breast cancer and presumed peritoneal carcinomatosis. 3. Redemonstrated moderate volume ascites throughout the abdomen pelvis with extensive peritoneal thickening and omental nodularity throughout the abdomen, similar to prior examination, consistent with peritoneal carcinomatosis. As on prior examinations, there is no obvious primary malignancy in the abdomen or pelvis and primary differential considerations include ovarian, endometrial, and recurrent breast malignancy. 4. Redemonstrated bulky, partially calcified fibroid uterus. 5. No evidence of bowel obstruction. 6. Other chronic and incidental findings as above. 7. Aortic Atherosclerosis (ICD10-I70.0). These results were called by telephone at the time of interpretation on 08/20/2020 at 9:53 am to provider DAVID Sabino Gasser , who verbally acknowledged these results. Electronically Signed   By: Eddie Candle M.D.   On: 08/20/2020 10:01   CT Abdomen Pelvis W Contrast  Result Date: 08/11/2020 CLINICAL DATA:  Abdominal distension. Ascites. Fibroids. Personal history of breast carcinoma. EXAM: CT ABDOMEN AND PELVIS WITH CONTRAST TECHNIQUE: Multidetector CT imaging of the abdomen and pelvis was performed using the standard protocol following bolus administration of intravenous contrast. CONTRAST:  136mL OMNIPAQUE IOHEXOL 300 MG/ML  SOLN COMPARISON:  Pelvis  MRI on 08/04/2020 FINDINGS: Lower Chest: Moderate left and small right pleural effusions, with left greater than right lower lobe atelectasis. Previous right mastectomy. Hepatobiliary: No hepatic masses identified. Gallbladder is unremarkable. No evidence of biliary ductal dilatation. Pancreas:  No mass or inflammatory changes. Spleen: Within normal limits in size and appearance. Adrenals/Urinary Tract: No masses identified. 5 cm left renal cyst noted. No evidence of ureteral calculi or hydronephrosis. Stomach/Bowel: Moderate hiatal hernia is seen. No evidence of obstruction, inflammatory process or abnormal fluid collections. Vascular/Lymphatic: No pathologically enlarged lymph nodes. No abdominal aortic aneurysm. Aortic atherosclerotic calcification noted. Reproductive: Markedly enlarged uterus is seen due to multiple partially calcified uterine fibroids. The largest fibroid is subserosal in location in the posterior uterine corpus measuring 15.6 cm. Thickening of the endometrium is seen in the fundal region uterus which measures up to 15 mm. Endometrial carcinoma cannot be excluded in a postmenopausal female. No separate adnexal masses are identified. Other: A large amount of ascites is seen. Diffuse mesenteric soft tissue stranding is seen as well as omental caking. Mild peritoneal thickening and enhancement is also seen in the lower abdomen pelvis. These findings are highly suspicious for peritoneal carcinomatosis. Musculoskeletal:  No suspicious bone lesions identified. IMPRESSION: Large amount of ascites, and diffuse mesenteric soft tissue stranding and omental caking, highly suspicious for peritoneal carcinomatosis. Markedly enlarged uterus due to multiple fibroids measuring up to 15 cm. Thickening of the endometrium in the fundal region up to 15 mm. Endometrial carcinoma cannot be excluded in a postmenopausal female. Moderate hiatal hernia. Moderate left and small right pleural effusions, with bilateral  lower lobe atelectasis. Electronically Signed   By: Marlaine Hind M.D.   On: 08/11/2020 17:14   US Paracentesis  Result Date: 08/18/2020 INDICATION: Patient with history of pelvic mass, carcinomatosis, recurrent ascites; request received for diagnostic and therapeutic paracentesis. EXAM: ULTRASOUND GUIDED DIAGNOSTIC AND THERAPEUTIC PARACENTESIS MEDICATIONS: 1% lidocaine to skin and subcutaneous tissue COMPLICATIONS: None immediate. PROCEDURE: Informed written consent was obtained from the patient after a discussion of the risks, benefits and alternatives to treatment. A timeout was performed prior to the initiation of the procedure. Initial ultrasound scanning demonstrates a small to moderate amount of ascites within the left mid to lower abdominal quadrant. The left mid to lower abdomen was prepped and draped in the usual sterile fashion. 1% lidocaine was used for local anesthesia. Following this, a 19 gauge,  10-cm, Yueh catheter was introduced. An ultrasound image was saved for documentation purposes. The paracentesis was performed. The catheter was removed and a dressing was applied. The patient tolerated the procedure well without immediate post procedural complication. FINDINGS: A total of approximately 2.1 liters of amber fluid was removed. Samples were sent to the laboratory as requested by the clinical team. IMPRESSION: Successful ultrasound-guided diagnostic and therapeutic paracentesis yielding 2.1 liters of peritoneal fluid. Read by: Rowe Robert, PA-C Electronically Signed   By: Aletta Edouard M.D.   On: 08/18/2020 10:08   US Paracentesis  Result Date: 08/12/2020 INDICATION: Patient with a history of breast cancer now with suspected endometrial malignancy presents today with ascites. Interventional radiology asked to perform a therapeutic and diagnostic paracentesis. EXAM: ULTRASOUND GUIDED PARACENTESIS MEDICATIONS: 1% lidocaine 10 mL COMPLICATIONS: None immediate PROCEDURE: Informed written  consent was obtained from the patient after a discussion of the risks, benefits and alternatives to treatment. A timeout was performed prior to the initiation of the procedure. Initial ultrasound scanning demonstrates a large amount of ascites within the right lower abdominal quadrant. The right lower abdomen was prepped and draped in the usual sterile fashion. 1% lidocaine was used for local anesthesia. Following this, a 19 gauge, 7-cm, Yueh catheter was introduced. An ultrasound image was saved for documentation purposes. The paracentesis was performed. The catheter was removed and a dressing was applied. The patient tolerated the procedure well without immediate post procedural complication. FINDINGS: A total of approximately 4.8 L of light pink fluid was removed. Samples were sent to the laboratory as requested by the clinical team. IMPRESSION: Successful ultrasound-guided paracentesis yielding 4.8 liters of peritoneal fluid. Read by: Soyla Dryer, NP Electronically Signed   By: Lucrezia Europe M.D.   On: 08/12/2020 13:50   CT BIOPSY  Result Date: 08/18/2020 CLINICAL DATA:  Large pelvic mass, ascites and evidence of carcinomatosis by imaging. Prior paracentesis did not yield malignant cells by analysis of ascites and additional CT-guided omental biopsy is now performed. EXAM: CT GUIDED CORE BIOPSY OF OMENTUM ANESTHESIA/SEDATION: 2.0 mg IV Versed Total Moderate Sedation Time:  10 minutes. The patient's level of consciousness and physiologic status were continuously monitored during the procedure by Radiology nursing. The patient also received 4 mg IV Zofran for nausea. PROCEDURE: The procedure risks, benefits, and alternatives were explained to the patient. Questions regarding the procedure were encouraged and answered. The patient understands and consents to the procedure. A time-out was performed prior to initiating the procedure. Prior to the biopsy, a separate paracentesis procedure was performed to remove  ascites. This will be dictated separately. Localizing CT of the abdomen and pelvis was performed in a supine position. The anterior abdominal wall was prepped with chlorhexidine in a sterile fashion, and a sterile drape was applied covering the operative field. A sterile gown and sterile gloves were used for the procedure. Local anesthesia was provided with 1% Lidocaine. Under CT guidance, a 17 gauge trocar needle was advanced into anterior abdominal omental fat from a midline approach towards the left anterior peritoneal cavity. After confirming needle tip position, a total of 5 separate coaxial 18 gauge core biopsy samples were obtained of omentum and placed in formalin. Additional CT was performed. COMPLICATIONS: None FINDINGS: An area of anterior omental fat demonstrating infiltrative stranding was chosen inferior to the transverse colon. Solid tissue was obtained. IMPRESSION: CT-guided core biopsy performed of omental fat in the midline and left anterior abdomen just inferior to the transverse colon. Electronically Signed   By:  Aletta Edouard M.D.   On: 08/18/2020 09:43   VAS [place] LOWER EXTREMITY VENOUS (DVT) (ONLY [facility] & [facility] 7a-7p)  Result Date: 08/11/2020  Lower Venous DVTStudy Indications: Swelling.  Comparison Study: no prior Performing Technologist: Abram Sander RVS  Examination Guidelines: A complete evaluation includes B-mode imaging, spectral Doppler, color Doppler, and power Doppler as needed of all accessible portions of each vessel. Bilateral testing is considered an integral part of a complete examination. Limited examinations for reoccurring indications may be performed as noted. The reflux portion of the exam is performed with the patient in reverse Trendelenburg.  +-----+---------------+---------+-----------+----------+--------------+ RIGHTCompressibilityPhasicitySpontaneityPropertiesThrombus Aging +-----+---------------+---------+-----------+----------+--------------+ CFV  Full            Yes      Yes                                 +-----+---------------+---------+-----------+----------+--------------+   +---------+---------------+---------+-----------+----------+--------------+ LEFT     CompressibilityPhasicitySpontaneityPropertiesThrombus Aging +---------+---------------+---------+-----------+----------+--------------+ CFV      Full           Yes      Yes                                 +---------+---------------+---------+-----------+----------+--------------+ SFJ      Full                                                        +---------+---------------+---------+-----------+----------+--------------+ FV Prox  Full                                                        +---------+---------------+---------+-----------+----------+--------------+ FV Mid   Full                                                        +---------+---------------+---------+-----------+----------+--------------+ FV DistalFull                                                        +---------+---------------+---------+-----------+----------+--------------+ PFV      Full                                                        +---------+---------------+---------+-----------+----------+--------------+ POP      Full           Yes      Yes                                 +---------+---------------+---------+-----------+----------+--------------+ PTV      Full                                                        +---------+---------------+---------+-----------+----------+--------------+  PERO     None           No       No                   Acute          +---------+---------------+---------+-----------+----------+--------------+     Summary: RIGHT: - No evidence of common femoral vein obstruction.  LEFT: - Findings consistent with acute deep vein thrombosis involving the left peroneal veins. - No cystic structure found in the popliteal fossa.  *See  table(s) above for measurements and observations. Electronically signed by Servando Snare MD on 08/11/2020 at 5:30:50 PM.    Final     (Echo, Carotid, EGD, Colonoscopy, ERCP)    Subjective:  Patient resting in bed NG tube in place Discharge Exam: Vitals:   08/23/20 1423 08/24/20 0641  BP: 116/84 108/73  Pulse: (!) 103 (!) 103  Resp: 20 14  Temp: 97.8 F (36.6 C) 98 F (36.7 C)  SpO2: 92% 90%   Vitals:   08/23/20 0611 08/23/20 1423 08/23/20 1423 08/24/20 0641  BP: 123/87 116/84 116/84 108/73  Pulse: 98 (!) 103 (!) 103 (!) 103  Resp: 14 20 20 14   Temp: 98.2 F (36.8 C) 97.8 F (36.6 C) 97.8 F (36.6 C) 98 F (36.7 C)  TempSrc: Oral Oral Oral Oral  SpO2: 93% 93% 92% 90%  Weight:      Height:        General: Pt is alert, awake, not in acute distress Cardiovascular: RRR, S1/S2 +, no rubs, no gallops Respiratory: CTA bilaterally, no wheezing, no rhonchi Abdominal: Soft, NT, ND, bowel sounds + Extremities: no edema, no cyanosis    The results of significant diagnostics from this hospitalization (including imaging, microbiology, ancillary and laboratory) are listed below for reference.     Microbiology: No results found for this or any previous visit (from the past 240 hour(s)).   Labs: BNP (last 3 results) No results for input(s): BNP in the last 8760 hours. Basic Metabolic Panel: Recent Labs  Lab 08/18/20 0100 08/19/20 0839 08/20/20 0627 08/21/20 0533  NA 135 135 138 144  K 3.6 4.2 3.6 3.4*  CL 99 92* 92* 95*  CO2 25 29 31 30   GLUCOSE 121* 136* 107* 107*  BUN 38* 40* 34* 33*  CREATININE 1.25* 1.55* 1.40* 1.32*  CALCIUM 7.2* 8.8* 8.6* 8.0*  MG 2.3 2.7* 2.5* 2.6*   Liver Function Tests: No results for input(s): AST, ALT, ALKPHOS, BILITOT, PROT, ALBUMIN in the last 168 hours. No results for input(s): LIPASE, AMYLASE in the last 168 hours. No results for input(s): AMMONIA in the last 168 hours. CBC: Recent Labs  Lab 08/18/20 0100 08/19/20 0839  08/20/20 0627 08/21/20 0533  WBC 12.2* 14.9* 14.9* 17.3*  NEUTROABS  --   --  12.8* 15.3*  HGB 10.8* 12.9 11.9* 11.8*  HCT 33.1* 40.7 37.7 37.3  MCV 81.9 83.2 84.7 83.8  PLT 481* 466* 452* 445*   Cardiac Enzymes: No results for input(s): CKTOTAL, CKMB, CKMBINDEX, TROPONINI in the last 168 hours. BNP: Invalid input(s): POCBNP CBG: No results for input(s): GLUCAP in the last 168 hours. D-Dimer No results for input(s): DDIMER in the last 72 hours. Hgb A1c No results for input(s): HGBA1C in the last 72 hours. Lipid Profile No results for input(s): CHOL, HDL, LDLCALC, TRIG, CHOLHDL, LDLDIRECT in the last 72 hours. Thyroid function studies No results for input(s): TSH, T4TOTAL, T3FREE, THYROIDAB in the last 72 hours.  Invalid input(s): FREET3 Anemia work up No results for input(s): VITAMINB12, FOLATE, FERRITIN, TIBC, IRON, RETICCTPCT in the last 72 hours. Urinalysis    Component Value Date/Time   COLORURINE AMBER (A) 08/11/2020 1422   APPEARANCEUR HAZY (A) 08/11/2020 1422   LABSPEC 1.027 08/11/2020 1422   PHURINE 5.0 08/11/2020 1422   GLUCOSEU NEGATIVE 08/11/2020 1422   HGBUR NEGATIVE 08/11/2020 1422   BILIRUBINUR NEGATIVE 08/11/2020 1422   KETONESUR NEGATIVE 08/11/2020 1422   PROTEINUR 100 (A) 08/11/2020 1422   NITRITE NEGATIVE 08/11/2020 1422   LEUKOCYTESUR NEGATIVE 08/11/2020 1422   Sepsis Labs Invalid input(s): PROCALCITONIN,  WBC,  LACTICIDVEN Microbiology No results found for this or any previous visit (from the past 240 hour(s)).   Time coordinating discharge: 39 minutes  SIGNED:   Georgette Shell, MD  [facility] 08/24/2020, 12:25 PM

## 2020-08-24 NOTE — Progress Notes (Signed)
Manufacturing engineer [facility]) Hospital Liaison Note:  Update on this [place] Referral: Unfortunately [facility] is not able to offer a room today.  An Barrister's clerk will follow up with CSW and family tomorrow or sooner if room becomes available.  Please do not hesitate to call with questions.  Thank you.  Farrel Gordon, RN, CCM       [facility] Liaison (listed on AMION under [place])     [phone]

## 2020-08-24 NOTE — Progress Notes (Signed)
[facility]: [employer]  Met with pt and her son [name]. They are both in agreement with hospice philosophy. Spoke to our Market researcher at hospice home in [facility] and she was approved for comfort care. We do have a bed and offered it to family. They have accepted. She can transfer today. Webb Silversmith RN [phone]   Report number for nurse is [phone]

## 2020-08-24 NOTE — TOC Transition Note (Signed)
Transition of Care [facility]) - CM/SW Discharge Note   Patient Details  Name: Brandi Bowen MRN: [id number] Date of Birth: 1941-06-06  Transition of Care [facility]) CM/SW Contact:  Jaliza Seifried, Marjie Skiff, RN Phone Number: 08/24/2020, 1:10 PM   Clinical Narrative:     [place] has a residential bed today and family has agreed. Yellow DNR on chart for dc. [facility] transport to be set up for 3pm per Hospice request. RN to call report to [phone].  Final next level of care: [place] Barriers to Discharge: No Barriers Identified                 Patient to be transferred to facility by: [place] Name of family member notified: [name] Patient and family notified of of transfer: 08/24/20  Discharge Plan and Services    Social Determinants of Health (SDOH) Interventions     Readmission Risk Interventions No flowsheet data found.

## 2020-08-24 NOTE — Progress Notes (Signed)
This RN called report to Neldon Mc RN at [place]. [facility] to arrive at 3pm.

## 2020-09-04 DEATH — deceased

## 2020-09-23 IMAGING — US US PELVIS COMPLETE WITH TRANSVAGINAL
2 series · 13 of 25 positions shown · non-contrast
Comparison: None

CLINICAL DATA: Postmenopausal bleeding, fibroids

EXAM:
TRANSABDOMINAL AND TRANSVAGINAL ULTRASOUND OF PELVIS
TECHNIQUE: Both transabdominal and transvaginal ultrasound examinations of the
pelvis were performed. Transabdominal technique was performed for
global imaging of the pelvis including uterus, ovaries, adnexal
regions, and pelvic cul-de-sac. It was necessary to proceed with
endovaginal exam following the transabdominal exam to visualize the
uterus, endometrium, and ovaries. Transvaginal imaging severely
limited by bowel and presence of a large mass.

[Series 1: us pelvis complete with transvaginal · 10 of 38 slices shown (1 of 2)]
[im 1/38]
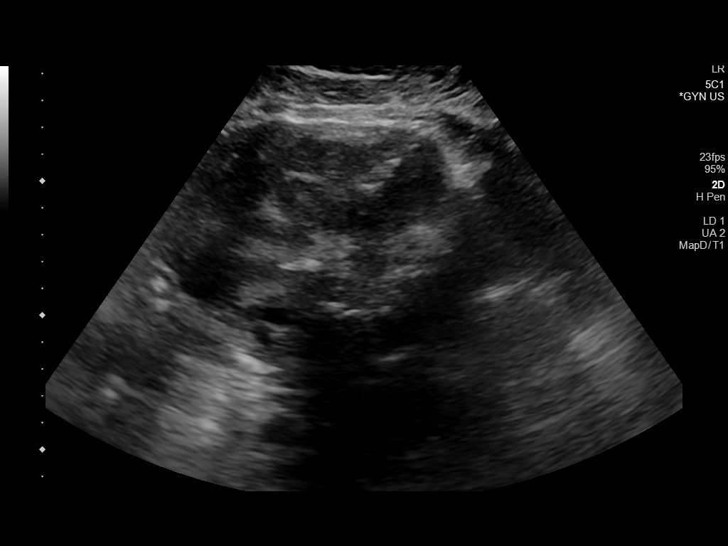
[im 4/38]
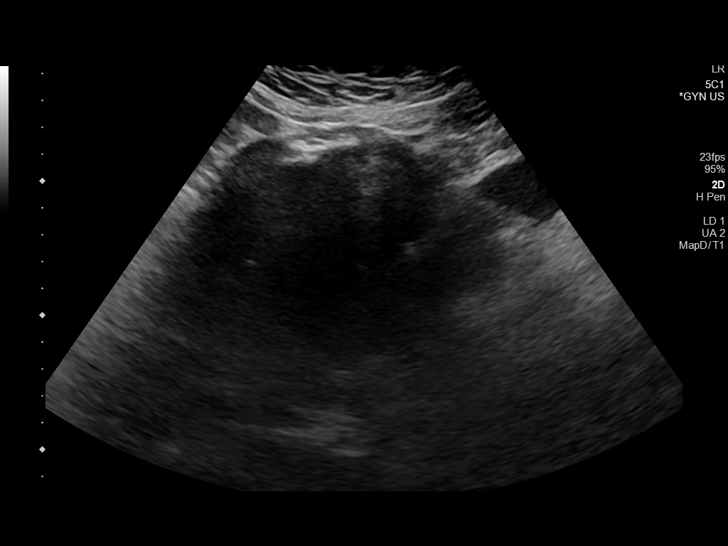
[im 8/38]
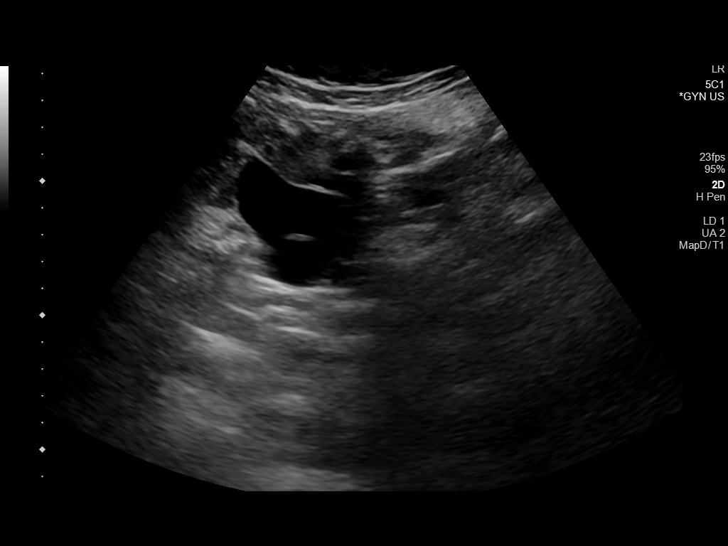
[im 12/38]
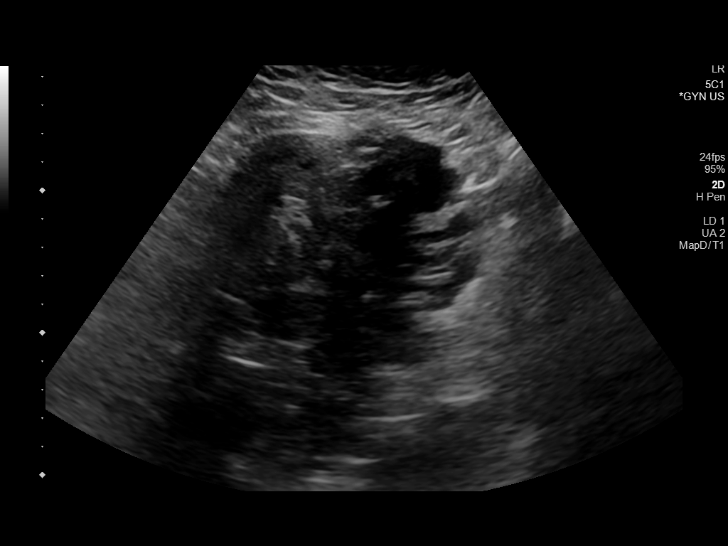
[im 16/38]
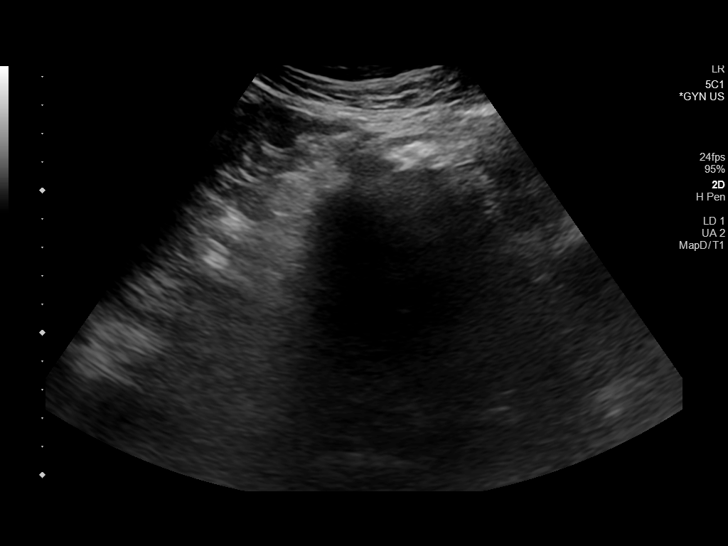
[im 20/38]
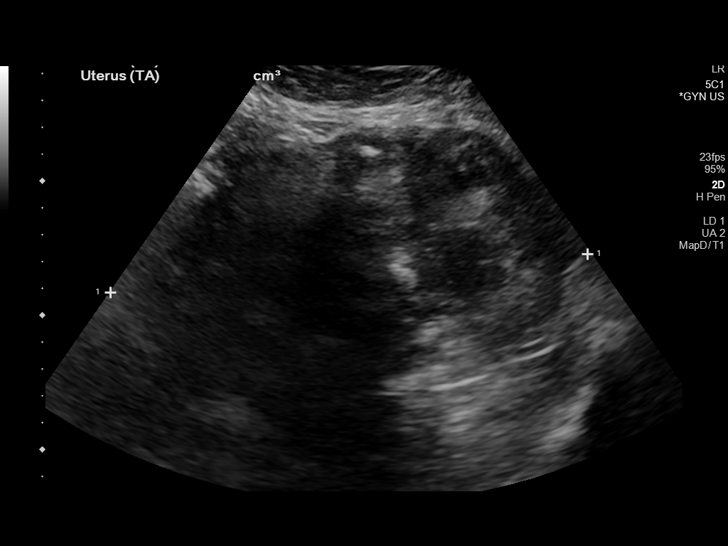
[im 24/38]
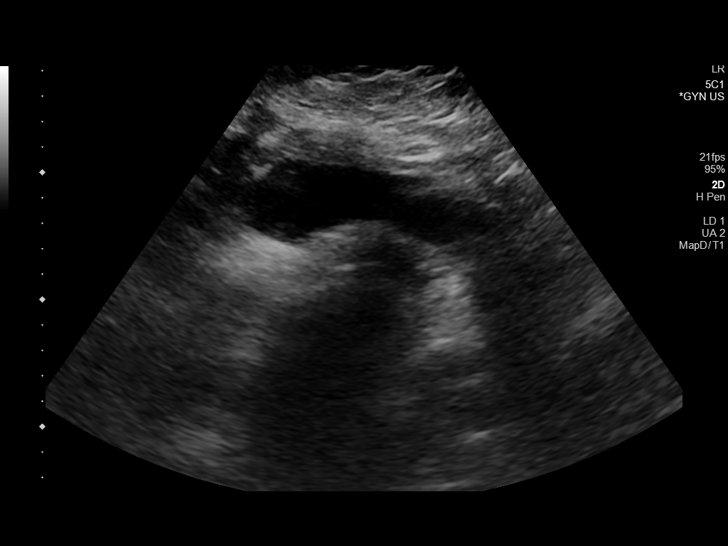
[im 28/38]
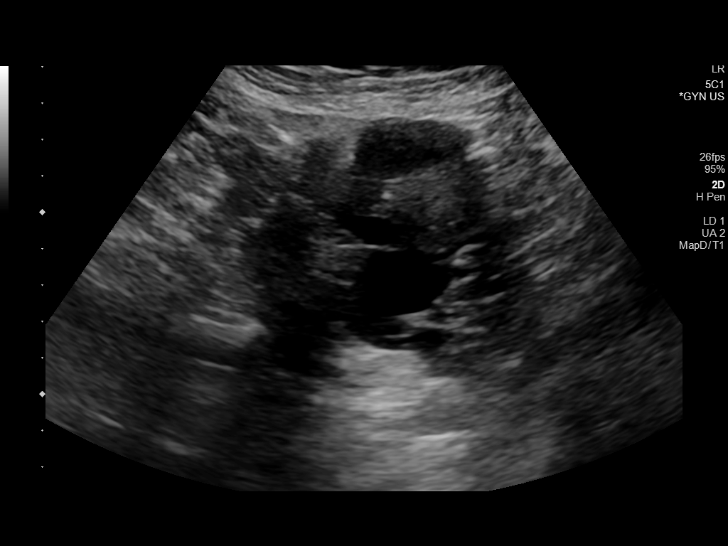
[im 32/38]
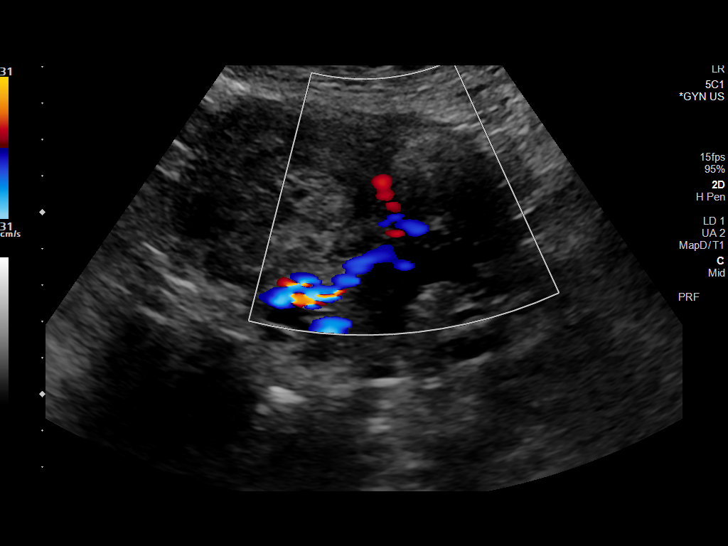
[im 36/38]
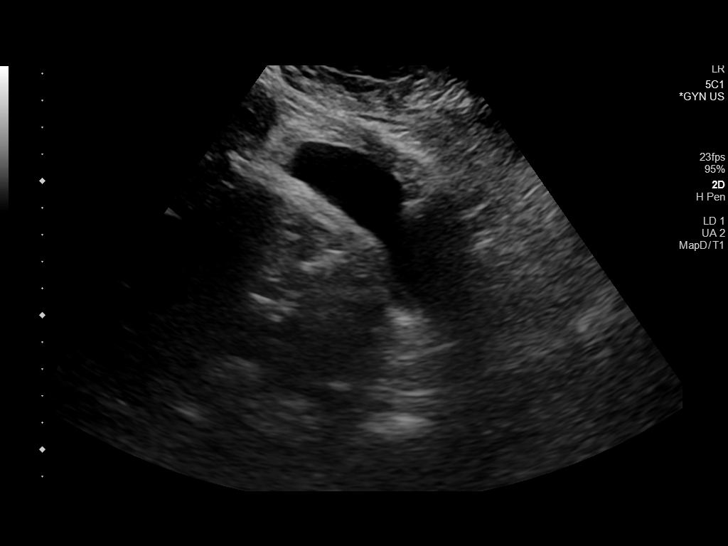

[Series 1001: us pelvis complete with transvaginal · 3 of 9 slices shown (2 of 2)]
[im 1/9]
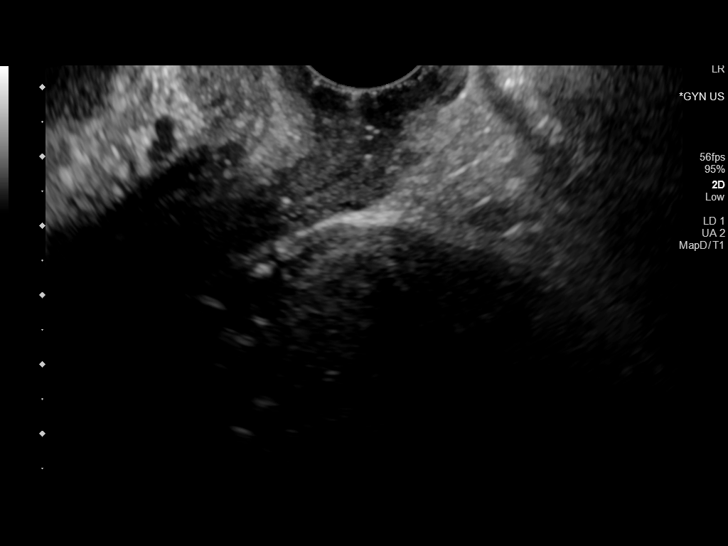
[im 5/9]
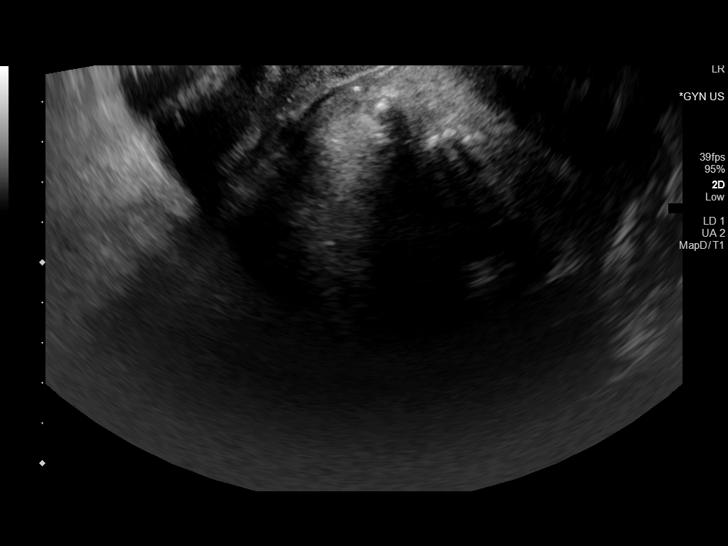
[im 9/9]
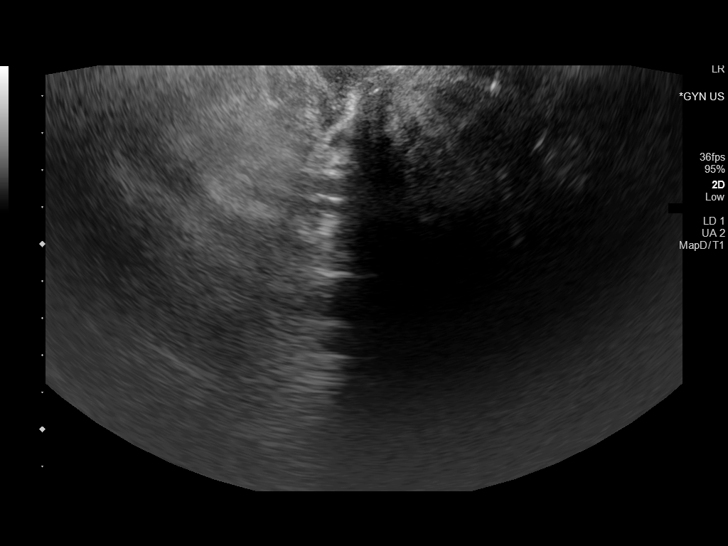

[13 of 25 positions shown; findings below may reference images not displayed]

FINDINGS: Uterus

Measurements: 16.9 x 11.2 x 17.8 cm = volume: 3402 mL. Uncertain
whether this represents a markedly enlarged uterus containing
multiple leiomyomata or a single large leiomyoma replacing the
uterus. Normal uterine architecture is not visualized.

Endometrium

Not visualized

Right ovary

Not visualized, likely obscured by bowel

Left ovary

Not visualized, likely obscured by bowel

Other findings

No free pelvic fluid. Small cystic structure identified in LEFT
adnexa 5.2 cm greatest size, could be of ovarian or non gynecologic
origin.
IMPRESSION: Unable to delineate uterus, endometrial complex and ovaries.

Large soft tissue mass in pelvis up to 17.8 cm diameter, favor
representing an enlarged leiomyomatous uterus, less likely pelvic
mass of other etiology such as ovarian or non gynecologic.

Additional 5.2 cm cystic structure LEFT adnexa.

Further evaluation of the pelvis by MR imaging with and without
contrast recommended to assess.

These results will be called to the ordering clinician or
representative by the Radiologist Assistant, and communication
documented in the PACS or [REDACTED].

## 2020-10-17 ENCOUNTER — Other Ambulatory Visit (HOSPITAL_COMMUNITY): Payer: Medicare HMO

## 2020-10-18 ENCOUNTER — Ambulatory Visit: Payer: Medicare HMO | Admitting: Gynecologic Oncology

## 2021-01-12 IMAGING — DX DG ABDOMEN 1V
1 series · 1 of 1 positions shown · non-contrast
Comparison: None.

CLINICAL DATA: Constipation.

EXAM:
ABDOMEN - 1 VIEW

[abdomen kub]
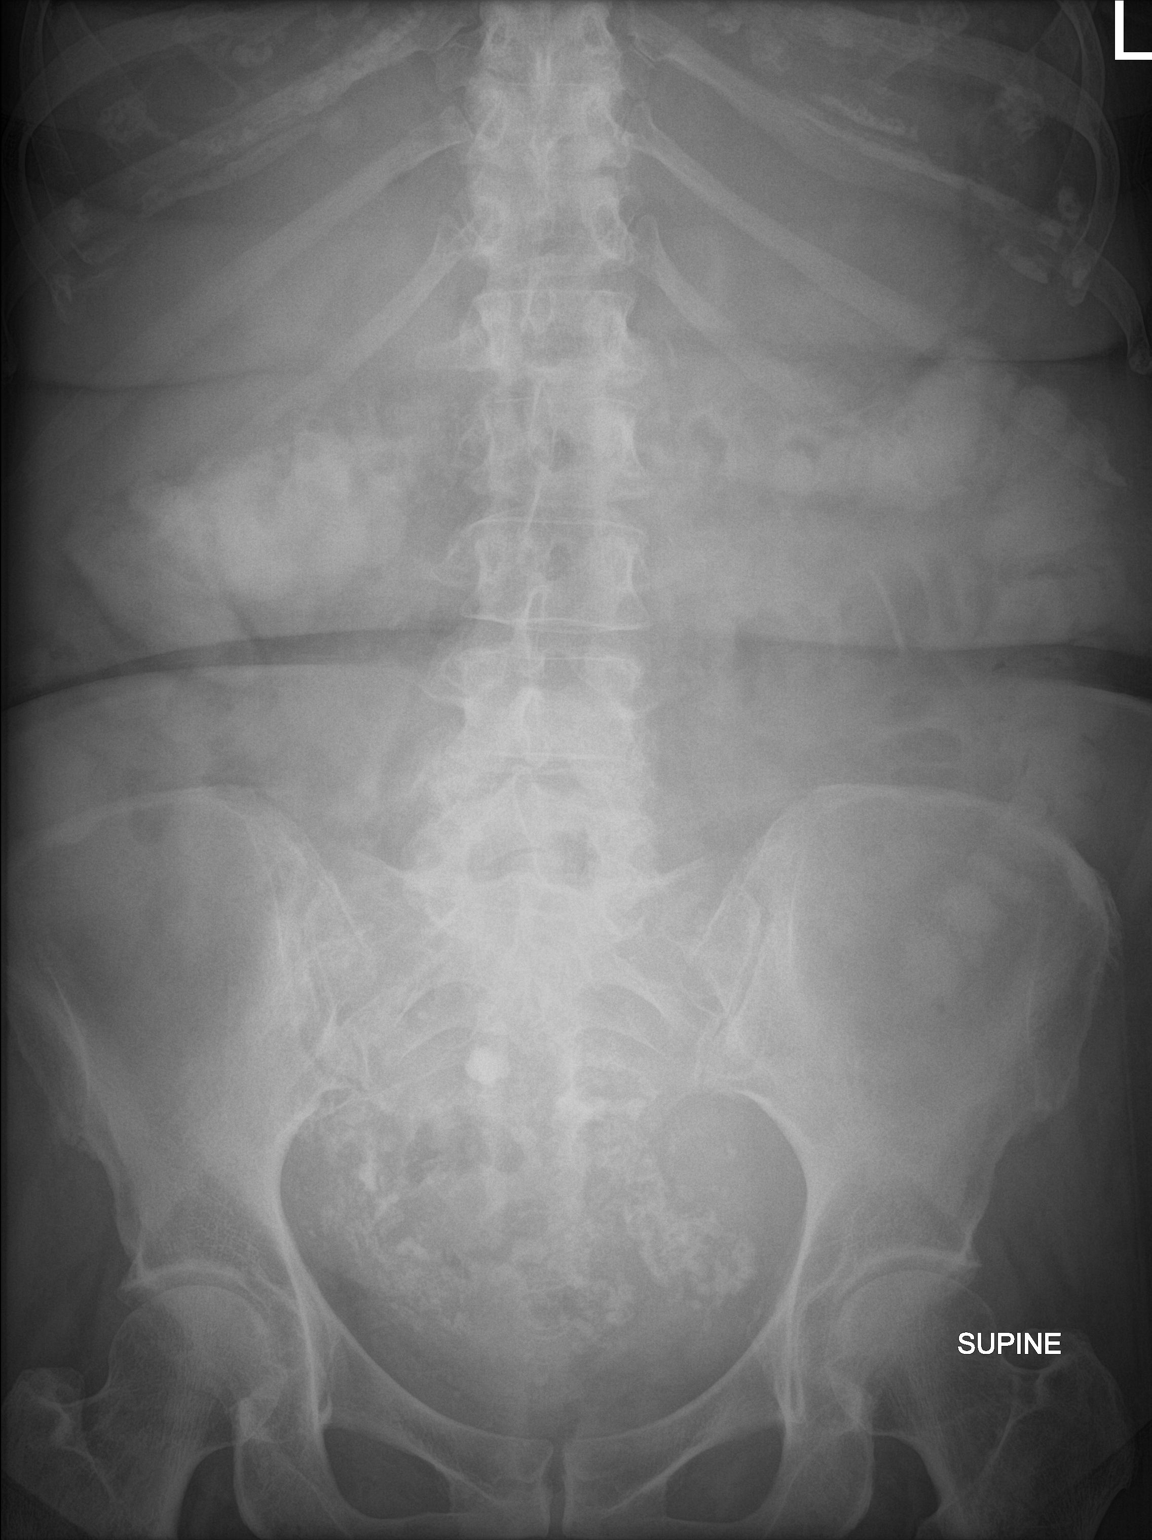

[1 of 1 positions shown; findings below may reference images not displayed]

FINDINGS: The bowel gas pattern is normal. Mild amount of residual contrast
and stool is seen throughout the colon. No radio-opaque calculi or
other significant radiographic abnormality are seen.
IMPRESSION: No evidence of bowel obstruction or ileus.
